# Patient Record
Sex: Female | Born: 1950 | Race: White | Hispanic: No | Marital: Married | Smoking: Former smoker
Health system: Southern US, Community
[De-identification: ages and names within clinical notes are randomized; demographics above are authoritative.]

## PROBLEM LIST (undated history)

## (undated) DIAGNOSIS — F419 Anxiety disorder, unspecified: Secondary | ICD-10-CM

## (undated) DIAGNOSIS — Z9289 Personal history of other medical treatment: Secondary | ICD-10-CM

## (undated) DIAGNOSIS — K5792 Diverticulitis of intestine, part unspecified, without perforation or abscess without bleeding: Secondary | ICD-10-CM

## (undated) DIAGNOSIS — R51 Headache: Secondary | ICD-10-CM

## (undated) DIAGNOSIS — G4700 Insomnia, unspecified: Secondary | ICD-10-CM

## (undated) DIAGNOSIS — F32A Depression, unspecified: Secondary | ICD-10-CM

## (undated) DIAGNOSIS — F329 Major depressive disorder, single episode, unspecified: Secondary | ICD-10-CM

## (undated) DIAGNOSIS — D649 Anemia, unspecified: Secondary | ICD-10-CM

## (undated) DIAGNOSIS — Z8709 Personal history of other diseases of the respiratory system: Secondary | ICD-10-CM

## (undated) DIAGNOSIS — Z87442 Personal history of urinary calculi: Secondary | ICD-10-CM

## (undated) DIAGNOSIS — C50919 Malignant neoplasm of unspecified site of unspecified female breast: Secondary | ICD-10-CM

## (undated) DIAGNOSIS — K219 Gastro-esophageal reflux disease without esophagitis: Secondary | ICD-10-CM

## (undated) DIAGNOSIS — I10 Essential (primary) hypertension: Secondary | ICD-10-CM

## (undated) DIAGNOSIS — Z8619 Personal history of other infectious and parasitic diseases: Secondary | ICD-10-CM

## (undated) DIAGNOSIS — J189 Pneumonia, unspecified organism: Secondary | ICD-10-CM

## (undated) DIAGNOSIS — H269 Unspecified cataract: Secondary | ICD-10-CM

## (undated) DIAGNOSIS — E785 Hyperlipidemia, unspecified: Secondary | ICD-10-CM

## (undated) HISTORY — PX: TUBAL LIGATION: SHX77

## (undated) HISTORY — PX: COLON SURGERY: SHX602

## (undated) HISTORY — PX: INCISIONAL HERNIA REPAIR: SHX193

## (undated) HISTORY — DX: Headache: R51

## (undated) HISTORY — PX: TONSILLECTOMY: SUR1361

## (undated) HISTORY — PX: MASTECTOMY: SHX3

## (undated) HISTORY — DX: Anxiety disorder, unspecified: F41.9

## (undated) HISTORY — PX: ESOPHAGOGASTRODUODENOSCOPY: SHX1529

## (undated) HISTORY — DX: Depression, unspecified: F32.A

## (undated) HISTORY — DX: Major depressive disorder, single episode, unspecified: F32.9

## (undated) HISTORY — PX: OTHER SURGICAL HISTORY: SHX169

---

## 1999-08-06 ENCOUNTER — Ambulatory Visit (HOSPITAL_COMMUNITY): Admission: RE | Admit: 1999-08-06 | Discharge: 1999-08-07 | Payer: Self-pay | Admitting: Cardiology

## 2012-06-23 HISTORY — PX: HERNIA REPAIR: SHX51

## 2012-07-30 DIAGNOSIS — N2889 Other specified disorders of kidney and ureter: Secondary | ICD-10-CM | POA: Insufficient documentation

## 2012-07-30 DIAGNOSIS — E669 Obesity, unspecified: Secondary | ICD-10-CM | POA: Insufficient documentation

## 2012-08-31 DIAGNOSIS — I10 Essential (primary) hypertension: Secondary | ICD-10-CM | POA: Insufficient documentation

## 2012-08-31 DIAGNOSIS — K219 Gastro-esophageal reflux disease without esophagitis: Secondary | ICD-10-CM | POA: Insufficient documentation

## 2012-08-31 DIAGNOSIS — F419 Anxiety disorder, unspecified: Secondary | ICD-10-CM | POA: Insufficient documentation

## 2012-09-02 DIAGNOSIS — D649 Anemia, unspecified: Secondary | ICD-10-CM | POA: Insufficient documentation

## 2013-02-24 ENCOUNTER — Encounter (HOSPITAL_COMMUNITY): Payer: Self-pay | Admitting: Psychiatry

## 2013-02-24 ENCOUNTER — Ambulatory Visit (INDEPENDENT_AMBULATORY_CARE_PROVIDER_SITE_OTHER): Payer: Self-pay | Admitting: Psychiatry

## 2013-02-24 VITALS — BP 110/80 | Ht 60.0 in | Wt 145.0 lb

## 2013-02-24 DIAGNOSIS — F332 Major depressive disorder, recurrent severe without psychotic features: Secondary | ICD-10-CM

## 2013-02-24 DIAGNOSIS — F411 Generalized anxiety disorder: Secondary | ICD-10-CM

## 2013-02-24 MED ORDER — OLANZAPINE 5 MG PO TABS: 5.0000 mg | ORAL_TABLET | Freq: Every day | ORAL | Status: DC

## 2013-02-24 MED ORDER — ALPRAZOLAM 0.5 MG PO TABS: 0.5000 mg | ORAL_TABLET | Freq: Four times a day (QID) | ORAL | Status: DC

## 2013-02-24 MED ORDER — DULOXETINE HCL 60 MG PO CPEP: 60.0000 mg | ORAL_CAPSULE | Freq: Two times a day (BID) | ORAL | Status: DC

## 2013-02-24 MED ORDER — LAMOTRIGINE 100 MG PO TABS: 100.0000 mg | ORAL_TABLET | Freq: Every day | ORAL | Status: DC

## 2013-02-24 NOTE — [facility] Notes (Signed)
Psychiatric Assessment Adult  Patient Identification:  Caitlin [name] Date of Evaluation:  02/24/2013 Chief Complaint: "I'm very depressed." History of Chief Complaint:  The patient has a history of depression and anxiety dating back to the late 90s Chief Complaint  Patient presents with  . Anxiety  . Depression    HPI this patient is a 62 year old married white female who lives with her husband in [place]. She has 4 children and 6 grandchildren. She worked in a [employer] until they let her go in 2005. She is self-referred.  The patient states that her depression started in the late 90s. She had to have an emergency colostomy due to diverticulosis. She had complications from the surgery and had to have a hernia repair and several other surgeries on her abdomen. She is left with a huge scar which lowered her self-confidence and self-esteem. She also suffers from migraine headaches.  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 Since then she's really gone downhill. She's become increasingly depressed and anxious. She's been seen at [facility] since 2009 but feels like the most recent psychiatrist does not listen to her and changed all the medications that were helpful. Currently she has lots of headaches, she's tired all the time and doesn't leave the house. She feels like she is a burden to everyone around her and has passive suicidal ideation but no plan. She sleeps okay her appetite has diminished.  In 2011 she was admitted to [facility] because she was depressed and psychotic. She thinks this was a reaction to a medication that she got from migraine headache. She doesn't recall the name of the medicine. She was not hospitalized since then has had little therapy. Review of Systems  Constitutional: Positive for fatigue.  Neurological: Positive for headaches.  Psychiatric/Behavioral: The patient is nervous/anxious.    chronic migraine headaches Physical Exam  not done  Depressive Symptoms: depressed mood, anhedonia, psychomotor retardation, fatigue, feelings of worthlessness/guilt, difficulty concentrating, hopelessness, impaired memory, suicidal thoughts without plan, anxiety, loss of energy/fatigue, weight loss,  (Hypo) Manic Symptoms:   Elevated Mood:  No Irritable Mood:  No Grandiosity:  No Distractibility:  No Labiality of Mood:  No Delusions:  No Hallucinations:  No Impulsivity:  No Sexually Inappropriate Behavior:  No Financial Extravagance:  No Flight of Ideas:  No  Anxiety Symptoms: Excessive Worry:  yes Panic Symptoms:  Yes Agoraphobia:  Yes Obsessive Compulsive: Yes  Symptoms: None, Specific Phobias:  No Social Anxiety:  Yes  Psychotic Symptoms:  Hallucinations: No None Delusions:  No Paranoia:  No   Ideas of Reference:  No  PTSD Symptoms: Ever had a traumatic exposure:  No Had a traumatic exposure in the last month:  No Re-experiencing: No None Hypervigilance:  No Hyperarousal: No None Avoidance: Yes Foreshortened Future  Traumatic Brain Injury: No   Past Psychiatric History: Diagnosis: Maj. 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 Anxiety   . Depression    History of Loss of Consciousness:  No Seizure History:  No Cardiac History:  No Allergies:see below   Allergies  Allergen Reactions  . Codeine  Nausea/vomiting   Current Medications:  Current Outpatient Prescriptions  Medication Sig Dispense Refill  . ALPRAZolam (XANAX) 0.5 MG tablet Take 1 tablet (0.5 mg total) by mouth 4 (four) times daily.  30 tablet  2  . atorvastatin (LIPITOR) 10 MG tablet  Take 10 mg by mouth daily.      . butalbital-acetaminophen-caffeine (FIORICET, ESGIC) 50-325-40 MG per tablet       . DULoxetine (CYMBALTA) 60 MG capsule Take 1 capsule (60 mg total) by mouth 2 (two) times daily.  60 capsule  2  . lamoTRIgine (LAMICTAL) 100 MG tablet Take 1 tablet (100 mg total) by mouth daily.  30 tablet  2  . losartan (COZAAR) 100 MG tablet Take 100 mg by mouth daily.      Marland Kitchen OLANZapine (ZYPREXA) 5 MG tablet Take 1 tablet (5 mg total) by mouth at bedtime.  30 tablet  2  . topiramate (TOPAMAX) 100 MG tablet        No current facility-administered medications for this visit.    Previous Psychotropic Medications:  Medication Dose  Clonazepam 0.5 mg bid  Cymbalta  60 mg qhs  Lamictal 100 mg qhs               Substance Abuse History in the last 12 months:none                                                                                                   Medical Consequences of Substance Abuse:n/a  Legal Consequences of Substance Abuse: n/a  Family Consequences of Substance Abuse: n/a  Blackouts:  No DT's:  No Withdrawal Symptoms:  No None  Social History: Current Place of Residence: [place], [place] Place of Birth:unknown Family Members: Husband 4 children 6 grandchildren Marital Status:  Married Children: see above  Sons: See above  Daughters: See above Relationships: See above Education:  HS Graduate Educational Problems/Performance: Religious Beliefs/Practices: Christian History of Abuse: none Armed forces technical officer; former Furniture conservator/restorer History:  None. Legal History:none Hobbies/Interests: Used to love ceramics  Family History:   Family History  Problem Relation Age of Onset  . Depression Mother   . Alcohol abuse Father     Mental Status Examination/Evaluation: Objective:  Appearance: Well Groomed  Patent attorney::  Fair  Speech:  Slow  Volume:  Decreased  Mood:  Depressed and anxious   Affect:  Constricted,  Depressed and Tearful  Thought Process:  Coherent  Orientation:  Full (Time, Place, and Person)  Thought Content:  Negative  Suicidal Thoughts:  Yes.  without intent/plan  Homicidal Thoughts:  No  Judgement:  Impaired  Insight:  Lacking  Psychomotor Activity:  Decreased  Akathisia:  No  Handed:  Right  AIMS (if indicated):  n/a  Assets:  Communication Skills Desire for Improvement Intimacy Social Support Talents/Skills    Laboratory/X-Ray Psychological Evaluation(s)  We'll have these sent from her primary Dr.   none   Assessment: AXIS I Maj. depression, recurrent severe, generalized anxiety disorder  AXIS II Deferred  AXIS III Past Medical History  Diagnosis Date  . Anxiety   . Depression  migraine headache, history of colon resection and hernia repair, history of right kidney tumor removal   AXIS IV minimal  AXIS V 41-50 serious symptoms   Treatment Plan/Recommendations:  Plan of Care: Medications and psychotherapy   Laboratory:  We will have these sent over from her primary physician   Psychotherapy: She will start counseling as soon as possible with one of our therapists  Medications: Her husband gave me a list of medicines that seem to been helpful in the past-she will increase Cymbalta to 60 mg twice a day, discontinue clonazepam, start Xanax 0.5 mg 4 times a day, continue Lamictal 100 mg each bedtime and start Zyprexa 5 mg each bedtime   Routine PRN Medications:  No  Consultations:none  Safety Concerns: She and her husband have been instructed to call if her depression or suicidal ideation worsens. She'll return to see me in four-week's   Other: none     Diannia Ruder, MD 9/4/20143:53 PM

## 2013-03-24 ENCOUNTER — Ambulatory Visit (INDEPENDENT_AMBULATORY_CARE_PROVIDER_SITE_OTHER): Payer: No Typology Code available for payment source | Admitting: Psychiatry

## 2013-03-24 ENCOUNTER — Encounter (HOSPITAL_COMMUNITY): Payer: Self-pay | Admitting: Psychiatry

## 2013-03-24 VITALS — BP 120/72 | Ht 60.0 in | Wt 148.0 lb

## 2013-03-24 DIAGNOSIS — F329 Major depressive disorder, single episode, unspecified: Secondary | ICD-10-CM | POA: Insufficient documentation

## 2013-03-24 DIAGNOSIS — F411 Generalized anxiety disorder: Secondary | ICD-10-CM

## 2013-03-24 DIAGNOSIS — F332 Major depressive disorder, recurrent severe without psychotic features: Secondary | ICD-10-CM

## 2013-03-24 MED ORDER — ALPRAZOLAM 0.5 MG PO TABS: 0.5000 mg | ORAL_TABLET | Freq: Four times a day (QID) | ORAL | Status: DC

## 2013-03-24 NOTE — [facility] Notes (Signed)
Patient ID: Caitlin [name], female   DOB: 1950-10-18, 62 y.o.   MRN: [id number]  Psychiatric Assessment Adult  Patient Identification:  Caitlin [name] Date of Evaluation:  03/24/2013 Chief Complaint: "I'm doing much better now.'  History of Chief Complaint:  The patient has a history of depression and anxiety dating back to the late 90s  Chief Complaint  Patient presents with  . Anxiety  . Depression  . Follow-up    Anxiety Patient reports no nervous/anxious behavior.     this patient is a 62 year old married white female who lives with her husband in [place]. She has 4 children and 6 grandchildren. She worked in a [employer] until they let her go in 2005. She is self-referred.  The patient states that her depression started in the late 90s. She had to have an emergency colostomy due to diverticulosis. She had complications from the surgery and had to have a hernia repair and several other surgeries on her abdomen. She is left with a huge scar which lowered her self-confidence and self-esteem. She also suffers from migraine headaches.  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 Since then she's really gone downhill. She's become increasingly depressed and anxious. She's been seen at [facility] since 2009 but feels like the most recent psychiatrist does not listen to her and changed all the medications that were helpful. Currently she has lots of headaches, she's tired all the time and doesn't leave the house. She feels like she is a burden to everyone around her and has passive suicidal ideation but no plan. She sleeps okay her appetite has diminished.  In 2011 she was admitted to [facility] because she was depressed and psychotic. She thinks this was a reaction to a medication that she got from migraine headache. She doesn't recall the name of the medicine. She was not hospitalized since then has had little therapy  The patient returns after 4 weeks I  readjusted her medicines so she is now on Zyprexa and Xanax and increase Cymbalta. She is feeling much better. She feels like she is "back to myself." She is sleeping well, her mood is good and her headaches has disappeared. She's no longer having any suicidal ideation. She's able to go out and shop in stores by herself. She and her daughter even had her nails done Review of Systems  Constitutional: Negative for fatigue.  Neurological: Negative for headaches.  Psychiatric/Behavioral: The patient is not nervous/anxious.    chronic migraine headaches Physical Exam not done  Depressive Symptoms: depressed mood, anhedonia, psychomotor retardation, fatigue, feelings of worthlessness/guilt, difficulty concentrating, hopelessness, impaired memory, suicidal thoughts without plan, anxiety, loss of energy/fatigue, weight loss,  (Hypo) Manic Symptoms:   Elevated Mood:  No Irritable Mood:  No Grandiosity:  No Distractibility:  No Labiality of Mood:  No Delusions:  No Hallucinations:  No Impulsivity:  No Sexually Inappropriate Behavior:  No Financial Extravagance:  No Flight of Ideas:  No  Anxiety Symptoms: Excessive Worry:  yes Panic Symptoms:  Yes Agoraphobia:  Yes Obsessive Compulsive: Yes  Symptoms: None, Specific Phobias:  No Social Anxiety:  Yes  Psychotic Symptoms:  Hallucinations: No None Delusions:  No Paranoia:  No   Ideas of Reference:  No  PTSD Symptoms: Ever had a traumatic exposure:  No Had a traumatic exposure in the last month:  No Re-experiencing: No None Hypervigilance:  No Hyperarousal: No None Avoidance: Yes Foreshortened Future  Traumatic Brain Injury: No  Past Psychiatric History: Diagnosis: Maj. 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 Anxiety   . Depression    History of Loss of Consciousness:  No Seizure History:  No Cardiac History:  No Allergies:see below   Allergies  Allergen Reactions  . Codeine     Nausea/vomiting   Current Medications:  Current Outpatient Prescriptions  Medication Sig Dispense Refill  . ALPRAZolam (XANAX) 0.5 MG tablet Take 1 tablet (0.5 mg total) by mouth 4 (four) times daily.  30 tablet  2  . atorvastatin (LIPITOR) 10 MG tablet Take 10 mg by mouth daily.      . butalbital-acetaminophen-caffeine (FIORICET, ESGIC) 50-325-40 MG per tablet       . DULoxetine (CYMBALTA) 60 MG capsule Take 1 capsule (60 mg total) by mouth 2 (two) times daily.  60 capsule  2  . lamoTRIgine (LAMICTAL) 100 MG tablet Take 1 tablet (100 mg total) by mouth daily.  30 tablet  2  . losartan (COZAAR) 100 MG tablet Take 100 mg by mouth daily.      Marland Kitchen OLANZapine (ZYPREXA) 5 MG tablet Take 1 tablet (5 mg total) by mouth at bedtime.  30 tablet  2  . topiramate (TOPAMAX) 100 MG tablet        No current facility-administered medications for this visit.    Previous Psychotropic Medications:  Medication Dose  Clonazepam 0.5 mg bid  Cymbalta  60 mg qhs  Lamictal 100 mg qhs               Substance Abuse History in the last 12 months:none                                                                                                   Medical Consequences of Substance Abuse:n/a  Legal Consequences of Substance Abuse: n/a  Family Consequences of Substance Abuse: n/a  Blackouts:  No DT's:  No Withdrawal Symptoms:  No None  Social History: Current Place of Residence: [place], [place] Place of Birth:unknown Family Members: Husband 4 children 6 grandchildren Marital Status:  Married Children: see above  Sons: See above  Daughters: See above Relationships: See  above Education:  HS Graduate Educational Problems/Performance: Religious Beliefs/Practices: Christian History of Abuse: none Armed forces technical officer; former Furniture conservator/restorer History:  None. Legal History:none Hobbies/Interests: Used to love ceramics  Family History:   Family History  Problem Relation Age of Onset  . Depression Mother   . Alcohol abuse Father     Mental Status Examination/Evaluation: Objective:  Appearance: Well Groomed  Patent attorney::  Fair  Speech:  Normal   Volume:  Normal   Mood:  Happy   Affect:  Bright and cheerful   Thought Process:  Coherent  Orientation:  Full (Time, Place,  and Person)  Thought Content:  Negative  Suicidal Thoughts:  Yes.  without intent/plan  Homicidal Thoughts:  No  Judgement:  Good   Insight:  Good   Psychomotor Activity:  Decreased  Akathisia:  No  Handed:  Right  AIMS (if indicated):  n/a  Assets:  Communication Skills Desire for Improvement Intimacy Social Support Talents/Skills    Laboratory/X-Ray Psychological Evaluation(s)  We'll have these sent from her primary Dr.   none   Assessment: AXIS I Maj. depression, recurrent severe, generalized anxiety disorder  AXIS II Deferred  AXIS III Past Medical History  Diagnosis Date  . Anxiety   . Depression    migraine headache, history of colon resection and hernia repair, history of right kidney tumor removal   AXIS IV minimal  AXIS V 41-50 serious symptoms   Treatment Plan/Recommendations:  Plan of Care: Medications and psychotherapy   Laboratory:  We will have these sent over from her primary physician   Psychotherapy: She will start counseling as soon as possible with one of our therapists  Medications: Her husband gave me a list of medicines that seem to been helpful in the past-she will increase Cymbalta to 60 mg twice a day, discontinue clonazepam, start Xanax 0.5 mg 4 times a day, continue Lamictal 100 mg each bedtime and start Zyprexa 5 mg each  bedtime the patient will continue these medications as they've been very helpful.   Routine PRN Medications:  No  Consultations:none  Safety Concerns: She and her husband have been instructed to call if her depression or suicidal ideation worsens. She'll return to see me in 2 months   Other: none     Xadrian Craighead, MD 10/2/20141:17 PM

## 2013-05-24 ENCOUNTER — Ambulatory Visit (INDEPENDENT_AMBULATORY_CARE_PROVIDER_SITE_OTHER): Payer: No Typology Code available for payment source | Admitting: Psychiatry

## 2013-05-24 ENCOUNTER — Encounter (HOSPITAL_COMMUNITY): Payer: Self-pay | Admitting: Psychiatry

## 2013-05-24 VITALS — BP 130/78 | Ht 60.0 in | Wt 155.0 lb

## 2013-05-24 DIAGNOSIS — F329 Major depressive disorder, single episode, unspecified: Secondary | ICD-10-CM

## 2013-05-24 DIAGNOSIS — F411 Generalized anxiety disorder: Secondary | ICD-10-CM

## 2013-05-24 DIAGNOSIS — F332 Major depressive disorder, recurrent severe without psychotic features: Secondary | ICD-10-CM

## 2013-05-24 MED ORDER — ALPRAZOLAM 0.5 MG PO TABS: 0.5000 mg | ORAL_TABLET | Freq: Four times a day (QID) | ORAL | Status: DC

## 2013-05-24 MED ORDER — OLANZAPINE 5 MG PO TABS: 5.0000 mg | ORAL_TABLET | Freq: Every day | ORAL | Status: DC

## 2013-05-24 MED ORDER — DULOXETINE HCL 60 MG PO CPEP: 60.0000 mg | ORAL_CAPSULE | Freq: Two times a day (BID) | ORAL | Status: DC

## 2013-05-24 MED ORDER — LAMOTRIGINE 100 MG PO TABS: 100.0000 mg | ORAL_TABLET | Freq: Every day | ORAL | Status: DC

## 2013-05-24 NOTE — [facility] Notes (Signed)
Patient ID: Caitlin [name], female   DOB: [birth year], 62 y.o.   MRN: [id number] Patient ID: Caitlin [name], female   DOB: August 13, 1950, 62 y.o.   MRN: [id number]  Psychiatric Assessment Adult  Patient Identification:  Caitlin [name] Date of Evaluation:  05/24/2013 Chief Complaint: "I'm doing much better now.'  History of Chief Complaint:  The patient has a history of depression and anxiety dating back to the late 90s  Chief Complaint  Patient presents with  . Anxiety  . Depression  . Follow-up    Anxiety Patient reports no nervous/anxious behavior.     this patient is a 62 year old married white female who lives with her husband in [place]. She has 4 children and 6 grandchildren. She worked in a [employer] until they let her go in 2005. She is self-referred.  The patient states that her depression started in the late 90s. She had to have an emergency colostomy due to diverticulosis. She had complications from the surgery and had to have a hernia repair and several other surgeries on her abdomen. She is left with a huge scar which lowered her self-confidence and self-esteem. She also suffers from migraine headaches.  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 Since then she's really gone downhill. She's become increasingly depressed and anxious. She's been seen at [facility] since 2009 but feels like the most recent psychiatrist does not listen to her and changed all the medications that were helpful. Currently she has lots of headaches, she's tired all the time and doesn't leave the house. She feels like she is a burden to everyone around her and has passive suicidal ideation but no plan. She sleeps okay her appetite has diminished.  In 2011 she was admitted to [facility] because she was depressed and psychotic. She thinks this was a reaction to a medication that she got from migraine headache. She doesn't recall the name of the medicine. She was not hospitalized since  then has had little therapy  Patient returns after 2 months. She was doing very well last time on the new medications. On November 18 she had a hernia repair. She was off her medications for several days and regressed. Her husband tells me she got very confused and it sounds like she has some postsurgical delirium. She's back at home now and is recovering slowly. She has lost her voice because she tried to pull out her ventilator tube. She is still in a lot of pain from the surgery and only had the drains removed yesterday. She's back on her medicines and is "slowly coming around. According to her husband. Review of Systems  Constitutional: Negative for fatigue.  Neurological: Negative for headaches.  Psychiatric/Behavioral: The patient is not nervous/anxious.    chronic migraine headaches Physical Exam not done  Depressive Symptoms: depressed mood, anhedonia, psychomotor retardation, fatigue, feelings of worthlessness/guilt, difficulty concentrating, hopelessness, impaired memory, suicidal thoughts without plan, anxiety, loss of energy/fatigue, weight loss,  (Hypo) Manic Symptoms:   Elevated Mood:  No Irritable Mood:  No Grandiosity:  No Distractibility:  No Labiality of Mood:  No Delusions:  No Hallucinations:  No Impulsivity:  No Sexually Inappropriate Behavior:  No Financial Extravagance:  No Flight of Ideas:  No  Anxiety Symptoms: Excessive Worry:  yes Panic Symptoms:  Yes Agoraphobia:  Yes Obsessive Compulsive: Yes  Symptoms: None, Specific Phobias:  No Social Anxiety:  Yes  Psychotic Symptoms:  Hallucinations: No None Delusions:  No Paranoia:  No  Ideas of Reference:  No  PTSD Symptoms: Ever had a traumatic exposure:  No Had a traumatic exposure in the last month:  No Re-experiencing: No None Hypervigilance:  No Hyperarousal: No None Avoidance: Yes Foreshortened Future  Traumatic Brain Injury: No   Past Psychiatric History: Diagnosis: Maj.  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 Anxiety   . Depression    History of Loss of Consciousness:  No Seizure History:  No Cardiac History:  No Allergies:see below   Allergies  Allergen Reactions  . Codeine     Nausea/vomiting   Current Medications:  Current Outpatient Prescriptions  Medication Sig Dispense Refill  . ALPRAZolam (XANAX) 0.5 MG tablet Take 1 tablet (0.5 mg total) by mouth 4 (four) times daily.  30 tablet  2  . atorvastatin (LIPITOR) 10 MG tablet Take 10 mg by mouth daily.      . butalbital-acetaminophen-caffeine (FIORICET, ESGIC) 50-325-40 MG per tablet       . DULoxetine (CYMBALTA) 60 MG capsule Take 1 capsule (60 mg total) by mouth 2 (two) times daily.  60 capsule  2  . HYDROcodone-acetaminophen (NORCO/VICODIN) 5-325 MG per tablet       . lamoTRIgine (LAMICTAL) 100 MG tablet Take 1 tablet (100 mg total) by mouth daily.  30 tablet  2  . losartan (COZAAR) 100 MG tablet Take 100 mg by mouth daily.      Marland Kitchen OLANZapine (ZYPREXA) 5 MG tablet Take 1 tablet (5 mg total) by mouth at bedtime.  30 tablet  2  . topiramate (TOPAMAX) 100 MG tablet        No current facility-administered medications for this visit.    Previous Psychotropic Medications:  Medication Dose  Clonazepam 0.5 mg bid  Cymbalta  60 mg qhs  Lamictal 100 mg qhs               Substance Abuse History in the last 12 months:none                                                                                                   Medical Consequences of Substance Abuse:n/a  Legal Consequences of Substance Abuse: n/a  Family Consequences of Substance  Abuse: n/a  Blackouts:  No DT's:  No Withdrawal Symptoms:  No None  Social History: Current Place of Residence: [place], [place] Place of Birth:unknown Family Members: Husband 4 children 6 grandchildren Marital Status:  Married Children: see above  Sons: See above  Daughters: See above Relationships: See above Education:  HS Graduate Educational Problems/Performance: Religious Beliefs/Practices: Christian History of Abuse: none Armed forces technical officer; former Furniture conservator/restorer History:  None. Legal History:none Hobbies/Interests: Used to love ceramics  Family History:   Family History  Problem Relation Age of Onset  .  Depression Mother   . Alcohol abuse Father     Mental Status Examination/Evaluation: Objective:  Appearance: Well Groomed  Patent attorney::  Fair  Speech: Faint and difficult to understand   Volume:  Normal   Mood:  She is obviously in pain today   Affect:  Congruent   Thought Process:  Coherent  Orientation:  Full (Time, Place, and Person)  Thought Content:  Negative  Suicidal Thoughts:  Yes.  without intent/plan  Homicidal Thoughts:  No  Judgement:  Good   Insight:  Good   Psychomotor Activity:  Decreased  Akathisia:  No  Handed:  Right  AIMS (if indicated):  n/a  Assets:  Communication Skills Desire for Improvement Intimacy Social Support Talents/Skills    Laboratory/X-Ray Psychological Evaluation(s)  We'll have these sent from her primary Dr.   none   Assessment: AXIS I Maj. depression, recurrent severe, generalized anxiety disorder  AXIS II Deferred  AXIS III Past Medical History  Diagnosis Date  . Anxiety   . Depression    migraine headache, history of colon resection and hernia repair, history of right kidney tumor removal   AXIS IV minimal  AXIS V 41-50 serious symptoms   Treatment Plan/Recommendations:  Plan of Care: Medications and psychotherapy   Laboratory:  We will have these sent over from her primary physician    Psychotherapy: She will start counseling as soon as possible with one of our therapists  Medications: Her husband gave me a list of medicines that seem to been helpful in the past-she will increase Cymbalta to 60 mg twice a day, discontinue clonazepam, start Xanax 0.5 mg 4 times a day, continue Lamictal 100 mg each bedtime and start Zyprexa 5 mg each bedtime the patient will continue these medications as they've been very helpful.   Routine PRN Medications:  No  Consultations:none  Safety Concerns: She and her husband have been instructed to call if her depression or suicidal ideation worsens. She'll return to see me in 2 months   Other: none     Diannia Ruder, MD 12/2/20141:14 PM

## 2013-07-26 ENCOUNTER — Encounter (HOSPITAL_COMMUNITY): Payer: Self-pay | Admitting: Psychiatry

## 2013-07-26 ENCOUNTER — Ambulatory Visit (INDEPENDENT_AMBULATORY_CARE_PROVIDER_SITE_OTHER): Payer: Medicare HMO | Admitting: Psychiatry

## 2013-07-26 VITALS — BP 110/72 | Ht 60.0 in | Wt 150.0 lb

## 2013-07-26 DIAGNOSIS — F411 Generalized anxiety disorder: Secondary | ICD-10-CM

## 2013-07-26 DIAGNOSIS — F332 Major depressive disorder, recurrent severe without psychotic features: Secondary | ICD-10-CM

## 2013-07-26 DIAGNOSIS — F329 Major depressive disorder, single episode, unspecified: Secondary | ICD-10-CM

## 2013-07-26 MED ORDER — DULOXETINE HCL 60 MG PO CPEP: 60.0000 mg | ORAL_CAPSULE | Freq: Two times a day (BID) | ORAL | Status: DC

## 2013-07-26 MED ORDER — OLANZAPINE 5 MG PO TABS: 5.0000 mg | ORAL_TABLET | Freq: Every day | ORAL | Status: DC

## 2013-07-26 MED ORDER — LAMOTRIGINE 100 MG PO TABS: 100.0000 mg | ORAL_TABLET | Freq: Every day | ORAL | Status: DC

## 2013-07-26 MED ORDER — ALPRAZOLAM 0.5 MG PO TABS: 0.5000 mg | ORAL_TABLET | Freq: Four times a day (QID) | ORAL | Status: DC

## 2013-07-26 NOTE — Progress Notes (Signed)
Patient ID: Caitlin [name], female   DOB: 1950/08/22, 63 y.o.   MRN: [id number] Patient ID: Caitlin [name], female   DOB: September 06, 1950, 63 y.o.   MRN: [id number] Patient ID: Caitlin [name], female   DOB: 12/30/1950, 63 y.o.   MRN: [id number]  Psychiatric Assessment Adult  Patient Identification:  Caitlin [name] Date of Evaluation:  07/26/2013 Chief Complaint: "I have a headache today.'  History of Chief Complaint:  The patient has a history of depression and anxiety dating back to the late 90s  Chief Complaint  Patient presents with  . Anxiety  . Depression  . Follow-up    Anxiety Patient reports no nervous/anxious behavior.     this patient is a 63 year old married white female who lives with her husband in [place]. She has 4 children and 6 grandchildren. She worked in a [employer] until they let her go in 2005. She is self-referred.  The patient states that her depression started in the late 90s. She had to have an emergency colostomy due to diverticulosis. She had complications from the surgery and had to have a hernia repair and several other surgeries on her abdomen. She is left with a huge scar which lowered her self-confidence and self-esteem. She also suffers from migraine headaches.  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 Since then she's really gone downhill. She's become increasingly depressed and anxious. She's been seen at [facility] since 2009 but feels like the most recent psychiatrist does not listen to her and changed all the medications that were helpful. Currently she has lots of headaches, she's tired all the time and doesn't leave the house. She feels like she is a burden to everyone around her and has passive suicidal ideation but no plan. She sleeps okay her appetite has diminished.  In 2011 she was admitted to [facility] because she was depressed and psychotic. She thinks this was a reaction to a medication that she got from migraine headache. She  doesn't recall the name of the medicine. She was not hospitalized since then has had little therapy  Patient returns after 2 months. She is healed up from her hernia repair. Today she has a bad headache and states she's been going on for a while. They tend to run in her family in numerous people have migraines. Her primary Dr. put her on Fioricet which helps to some degree but makes her sleepy the afternoon. Her husband still think she worries too much about things and having happened yet. She is only using 2 Xanax a day and I think we need to increase this. Her mood is been fairly stable although she misses the children. They do come over every Sunday for dinner. Review of Systems  Constitutional: Negative for fatigue.  Neurological: Negative for headaches.  Psychiatric/Behavioral: The patient is not nervous/anxious.    chronic migraine headaches Physical Exam not done  Depressive Symptoms: depressed mood, anhedonia, psychomotor retardation, fatigue, feelings of worthlessness/guilt, difficulty concentrating, hopelessness, impaired memory, suicidal thoughts without plan, anxiety, loss of energy/fatigue, weight loss,  (Hypo) Manic Symptoms:   Elevated Mood:  No Irritable Mood:  No Grandiosity:  No Distractibility:  No Labiality of Mood:  No Delusions:  No Hallucinations:  No Impulsivity:  No Sexually Inappropriate Behavior:  No Financial Extravagance:  No Flight of Ideas:  No  Anxiety Symptoms: Excessive Worry:  yes Panic Symptoms:  Yes Agoraphobia:  Yes Obsessive Compulsive: Yes  Symptoms: None, Specific Phobias:  No Social  Anxiety:  Yes  Psychotic Symptoms:  Hallucinations: No None Delusions:  No Paranoia:  No   Ideas of Reference:  No  PTSD Symptoms: Ever had a traumatic exposure:  No Had a traumatic exposure in the last month:  No Re-experiencing: No None Hypervigilance:  No Hyperarousal: No None Avoidance: Yes Foreshortened Future  Traumatic Brain  Injury: No   Past Psychiatric History: Diagnosis: Maj. 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 Anxiety   . Depression   . Headache(784.0)    History of Loss of Consciousness:  No Seizure History:  No Cardiac History:  No Allergies:see below   Allergies  Allergen Reactions  . Codeine     Nausea/vomiting   Current Medications:  Current Outpatient Prescriptions  Medication Sig Dispense Refill  . ALPRAZolam (XANAX) 0.5 MG tablet Take 1 tablet (0.5 mg total) by mouth 4 (four) times daily.  30 tablet  2  . atorvastatin (LIPITOR) 10 MG tablet Take 10 mg by mouth daily.      . butalbital-acetaminophen-caffeine (FIORICET, ESGIC) 50-325-40 MG per tablet       . DULoxetine (CYMBALTA) 60 MG capsule Take 1 capsule (60 mg total) by mouth 2 (two) times daily.  60 capsule  2  . HYDROcodone-acetaminophen (NORCO/VICODIN) 5-325 MG per tablet       . lamoTRIgine (LAMICTAL) 100 MG tablet Take 1 tablet (100 mg total) by mouth daily.  30 tablet  2  . losartan (COZAAR) 100 MG tablet Take 100 mg by mouth daily.      Marland Kitchen OLANZapine (ZYPREXA) 5 MG tablet Take 1 tablet (5 mg total) by mouth at bedtime.  30 tablet  2  . topiramate (TOPAMAX) 100 MG tablet        No current facility-administered medications for this visit.    Previous Psychotropic Medications:  Medication Dose  Clonazepam 0.5 mg bid  Cymbalta  60 mg qhs  Lamictal 100 mg qhs               Substance Abuse History in the last 12 months:none                                                                                                   Medical Consequences of Substance  Abuse:n/a  Legal Consequences of Substance Abuse: n/a  Family Consequences of Substance Abuse: n/a  Blackouts:  No DT's:  No Withdrawal Symptoms:  No None  Social History: Current Place of Residence: [place], [place] Family Members: Husband 4 children 6 grandchildren Marital Status:  Married Children: see above  Sons: See above  Daughters: See above Relationships: See above Education:  HS Graduate Educational Problems/Performance: Religious Beliefs/Practices: Christian History of Abuse: none Pensions consultant; former Solicitor History:  None.  Legal History:none Hobbies/Interests: Used to love ceramics  Family History:   Family History  Problem Relation Age of Onset  . Depression Mother   . Alcohol abuse Father     Mental Status Examination/Evaluation: Objective:  Appearance: Well Groomed tired and keeps closing her eyes   Eye Contact::  Fair  Speech: Faint and difficult to understand   Volume:  Normal   Mood:  She is obviously in pain today   Affect:  Congruent   Thought Process:  Coherent  Orientation:  Full (Time, Place, and Person)  Thought Content:  Negative  Suicidal Thoughts:  Yes.  without intent/plan  Homicidal Thoughts:  No  Judgement:  Good   Insight:  Good   Psychomotor Activity:  Decreased  Akathisia:  No  Handed:  Right  AIMS (if indicated):  n/a  Assets:  Communication Skills Desire for Improvement Intimacy Social Support Talents/Skills    Laboratory/X-Ray Psychological Evaluation(s)  We'll have these sent from her primary Dr.   none   Assessment: AXIS I Maj. depression, recurrent severe, generalized anxiety disorder  AXIS II Deferred  AXIS III Past Medical History  Diagnosis Date  . Anxiety   . Depression   . Headache(784.0)    migraine headache, history of colon resection and hernia repair, history of right kidney tumor removal   AXIS IV minimal  AXIS V 41-50 serious symptoms   Treatment  Plan/Recommendations:  Plan of Care: Medications and psychotherapy   Laboratory:  We will have these sent over from her primary physician   Psychotherapy: She tells me she really doesn't feel comfortable doing the counseling   Medications: She'll continue her current medications but make sure she continues the Xanax 0.5 mg 3 times a day   Routine PRN Medications:  No  Consultations:none  Safety Concerns: She and her husband have been instructed to call if her depression or suicidal ideation worsens. She'll return to see me in 6 weeks   Other: none     [name], MD 2/3/20151:32 PM

## 2013-09-06 ENCOUNTER — Encounter (HOSPITAL_COMMUNITY): Payer: Self-pay | Admitting: Psychiatry

## 2013-09-06 ENCOUNTER — Ambulatory Visit (INDEPENDENT_AMBULATORY_CARE_PROVIDER_SITE_OTHER): Payer: Medicare HMO | Admitting: Psychiatry

## 2013-09-06 VITALS — BP 100/70 | Ht 60.0 in | Wt 146.0 lb

## 2013-09-06 DIAGNOSIS — F332 Major depressive disorder, recurrent severe without psychotic features: Secondary | ICD-10-CM

## 2013-09-06 DIAGNOSIS — F329 Major depressive disorder, single episode, unspecified: Secondary | ICD-10-CM

## 2013-09-06 DIAGNOSIS — F411 Generalized anxiety disorder: Secondary | ICD-10-CM

## 2013-09-06 MED ORDER — ALPRAZOLAM 0.5 MG PO TABS: 0.5000 mg | ORAL_TABLET | Freq: Three times a day (TID) | ORAL | Status: DC | PRN

## 2013-09-06 MED ORDER — LAMOTRIGINE 100 MG PO TABS: 100.0000 mg | ORAL_TABLET | Freq: Every day | ORAL | Status: DC

## 2013-09-06 MED ORDER — DULOXETINE HCL 60 MG PO CPEP: 60.0000 mg | ORAL_CAPSULE | Freq: Two times a day (BID) | ORAL | Status: DC

## 2013-09-06 MED ORDER — OLANZAPINE 5 MG PO TABS: 5.0000 mg | ORAL_TABLET | Freq: Every day | ORAL | Status: DC

## 2013-09-06 NOTE — Progress Notes (Signed)
Patient ID: Caitlin [name], female   DOB: [birth year], 63 y.o.   MRN: [id number] Patient ID: Caitlin [name], female   DOB: 06/01/1951, 63 y.o.   MRN: [id number] Patient ID: Caitlin [name], female   DOB: May 04, 1951, 63 y.o.   MRN: [id number] Patient ID: [name], female   DOB: April 19, 1951, 63 y.o.   MRN: [id number]  Psychiatric Assessment Adult  Patient Identification:  Caitlin [name] Date of Evaluation:  09/06/2013 Chief Complaint: "I have a headache today.'  History of Chief Complaint:  The patient has a history of depression and anxiety dating back to the late 90s  Chief Complaint  Patient presents with  . Anxiety  . Depression  . Follow-up    Anxiety Patient reports no nervous/anxious behavior.     this patient is a 63 year old married white female who lives with her husband in [place]. She has 4 children and 6 grandchildren. She worked in a [employer] until they let her go in 2005. She is self-referred.  The patient states that her depression started in the late 90s. She had to have an emergency colostomy due to diverticulosis. She had complications from the surgery and had to have a hernia repair and several other surgeries on her abdomen. She is left with a huge scar which lowered her self-confidence and self-esteem. She also suffers from migraine headaches.  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 Since then she's really gone downhill. She's become increasingly depressed and anxious. She's been seen at [facility] since 2009 but feels like the most recent psychiatrist does not listen to her and changed all the medications that were helpful. Currently she has lots of headaches, she's tired all the time and doesn't leave the house. She feels like she is a burden to everyone around her and has passive suicidal ideation but no plan. She sleeps okay her appetite has diminished.  In 2011 she was admitted to [facility] because she was depressed and psychotic. She thinks  this was a reaction to a medication that she got from migraine headache. She doesn't recall the name of the medicine. She was not hospitalized since then has had little therapy  Patient returns after 2 months. She is doing better. She's just got over the flu and her energy is starting to come back. Her mood is generally good. She no longer feels suicidal or significantly depressed. She is sleeping well at night. She's hoping to get out more now the weather has improved and she is no longer sick Review of Systems  Constitutional: Negative for fatigue.  Neurological: Negative for headaches.  Psychiatric/Behavioral: The patient is not nervous/anxious.    chronic migraine headaches Physical Exam not done  Depressive Symptoms: depressed mood, anhedonia, psychomotor retardation, fatigue, feelings of worthlessness/guilt, difficulty concentrating, hopelessness, impaired memory, suicidal thoughts without plan, anxiety, loss of energy/fatigue, weight loss,  (Hypo) Manic Symptoms:   Elevated Mood:  No Irritable Mood:  No Grandiosity:  No Distractibility:  No Labiality of Mood:  No Delusions:  No Hallucinations:  No Impulsivity:  No Sexually Inappropriate Behavior:  No Financial Extravagance:  No Flight of Ideas:  No  Anxiety Symptoms: Excessive Worry:  yes Panic Symptoms:  Yes Agoraphobia:  Yes Obsessive Compulsive: Yes  Symptoms: None, Specific Phobias:  No Social Anxiety:  Yes  Psychotic Symptoms:  Hallucinations: No None Delusions:  No Paranoia:  No   Ideas of Reference:  No  PTSD Symptoms: Ever had a traumatic exposure:  No  Had a traumatic exposure in the last month:  No Re-experiencing: No None Hypervigilance:  No Hyperarousal: No None Avoidance: Yes Foreshortened Future  Traumatic Brain Injury: No   Past Psychiatric History: Diagnosis: Maj. 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 Anxiety   . Depression   . Headache(784.0)    History of Loss of Consciousness:  No Seizure History:  No Cardiac History:  No Allergies:see below   Allergies  Allergen Reactions  . Codeine     Nausea/vomiting   Current Medications:  Current Outpatient Prescriptions  Medication Sig Dispense Refill  . ALPRAZolam (XANAX) 0.5 MG tablet Take 1 tablet (0.5 mg total) by mouth 3 (three) times daily as needed for anxiety.  90 tablet  2  . atorvastatin (LIPITOR) 10 MG tablet Take 10 mg by mouth daily.      . butalbital-acetaminophen-caffeine (FIORICET, ESGIC) 50-325-40 MG per tablet       . DULoxetine (CYMBALTA) 60 MG capsule Take 1 capsule (60 mg total) by mouth 2 (two) times daily.  60 capsule  2  . HYDROcodone-acetaminophen (NORCO/VICODIN) 5-325 MG per tablet       . lamoTRIgine (LAMICTAL) 100 MG tablet Take 1 tablet (100 mg total) by mouth daily.  30 tablet  2  . losartan (COZAAR) 100 MG tablet Take 100 mg by mouth daily.      Marland Kitchen OLANZapine (ZYPREXA) 5 MG tablet Take 1 tablet (5 mg total) by mouth at bedtime.  30 tablet  2  . topiramate (TOPAMAX) 100 MG tablet        No current facility-administered medications for this visit.    Previous Psychotropic Medications:  Medication Dose  Clonazepam 0.5 mg bid  Cymbalta  60 mg qhs  Lamictal 100 mg qhs               Substance Abuse History in the last 12 months:none                                                                                                   Medical Consequences of Substance Abuse:n/a  Legal Consequences of Substance Abuse: n/a  Family Consequences of Substance Abuse: n/a  Blackouts:  No DT's:  No Withdrawal Symptoms:  No  None  Social History: Current Place of Residence: [place], [place] Family Members: Husband 4 children 6 grandchildren Marital Status:  Married Children: see above  Sons: See above  Daughters: See above Relationships: See above Education:  HS Graduate Educational Problems/Performance: Religious Beliefs/Practices: Christian History of Abuse: none Pensions consultant; former Solicitor History:  None. Legal History:none Hobbies/Interests: Used to love ceramics  Family History:   Family History  Problem Relation Age of Onset  . Depression Mother   . Alcohol abuse  Father     Mental Status Examination/Evaluation: Objective:  Appearance: Well Groomed    Patent attorney::  Fair  Speech: Clear   Volume:  Normal   Mood:  Fairly good today   Affect:  Congruent   Thought Process:  Coherent  Orientation:  Full (Time, Place, and Person)  Thought Content:  Negative  Suicidal Thoughts:  no  Homicidal Thoughts:  No  Judgement:  Good   Insight:  Good   Psychomotor Activity:  Decreased  Akathisia:  No  Handed:  Right  AIMS (if indicated):  n/a  Assets:  Communication Skills Desire for Improvement Intimacy Social Support Talents/Skills    Laboratory/X-Ray Psychological Evaluation(s)  We'll have these sent from her primary Dr.   none   Assessment: AXIS I Maj. depression, recurrent severe, generalized anxiety disorder  AXIS II Deferred  AXIS III Past Medical History  Diagnosis Date  . Anxiety   . Depression   . Headache(784.0)    migraine headache, history of colon resection and hernia repair, history of right kidney tumor removal   AXIS IV minimal  AXIS V 41-50 serious symptoms   Treatment Plan/Recommendations:  Plan of Care: Medications and psychotherapy   Laboratory:  We will have these sent over from her primary physician   Psychotherapy: She tells me she really doesn't feel comfortable doing the counseling   Medications: She'll  continue her current medications    Routine PRN Medications:  No  Consultations:none  Safety Concerns: She and her husband have been instructed to call if her depression or suicidal ideation worsens. She'll return to see me in 3 months   Other: none     Diannia Ruder, MD 3/17/20151:39 PM

## 2013-10-17 ENCOUNTER — Other Ambulatory Visit (HOSPITAL_COMMUNITY): Payer: Self-pay | Admitting: Internal Medicine

## 2013-10-17 DIAGNOSIS — Z1231 Encounter for screening mammogram for malignant neoplasm of breast: Secondary | ICD-10-CM

## 2013-10-24 ENCOUNTER — Ambulatory Visit (HOSPITAL_COMMUNITY)
Admission: RE | Admit: 2013-10-24 | Discharge: 2013-10-24 | Disposition: A | Discharge status: Home / Self Care | Payer: Medicare HMO | Source: Ambulatory Visit | Attending: Internal Medicine | Admitting: Internal Medicine

## 2013-10-24 DIAGNOSIS — Z1231 Encounter for screening mammogram for malignant neoplasm of breast: Secondary | ICD-10-CM | POA: Insufficient documentation

## 2013-10-25 ENCOUNTER — Other Ambulatory Visit: Payer: Self-pay | Admitting: Internal Medicine

## 2013-10-25 DIAGNOSIS — R928 Other abnormal and inconclusive findings on diagnostic imaging of breast: Secondary | ICD-10-CM

## 2013-11-04 ENCOUNTER — Other Ambulatory Visit: Payer: Self-pay | Admitting: Internal Medicine

## 2013-11-04 ENCOUNTER — Ambulatory Visit
Admission: RE | Admit: 2013-11-04 | Discharge: 2013-11-04 | Disposition: A | Discharge status: Home / Self Care | Payer: Medicare HMO | Source: Ambulatory Visit | Attending: Internal Medicine | Admitting: Internal Medicine

## 2013-11-04 DIAGNOSIS — R928 Other abnormal and inconclusive findings on diagnostic imaging of breast: Secondary | ICD-10-CM

## 2013-11-16 ENCOUNTER — Ambulatory Visit (INDEPENDENT_AMBULATORY_CARE_PROVIDER_SITE_OTHER): Payer: Medicare HMO | Admitting: General Surgery

## 2013-11-16 ENCOUNTER — Encounter (INDEPENDENT_AMBULATORY_CARE_PROVIDER_SITE_OTHER): Payer: Self-pay | Admitting: General Surgery

## 2013-11-16 VITALS — BP 128/80 | HR 87 | Temp 97.1°F | Ht 60.0 in | Wt 154.0 lb

## 2013-11-16 DIAGNOSIS — D0510 Intraductal carcinoma in situ of unspecified breast: Secondary | ICD-10-CM | POA: Insufficient documentation

## 2013-11-16 DIAGNOSIS — D0590 Unspecified type of carcinoma in situ of unspecified breast: Secondary | ICD-10-CM

## 2013-11-16 NOTE — Progress Notes (Signed)
Chief Complaint: New diagnosis of breast cancer  History:    Caitlin [name] is a 63 y.o. postmenopausal female referred by Dr. 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 She recently presented for a screening mamogram revealing extensive irregular masslike density associated with calcifications in the left breast..  Subsequent imaging included diagnostic mamogram showing pleomorphic calcifications an increased density extending over an 8-9 cm length of the outer left breast from the posterior one third of the breast extending to and involving the nipple and ultrasound showing no apparent breast mass although an abnormal left axillary lymph node was noted.   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 A second more posterior biopsy was negative for malignancy although was felt strongly to be discordant. An ultrasound guided biopsy was done of an abnormal appearing left axillary lymph node which was negative for malignancy.She is seen now in the office for initial treatment planning.  She has experienced no breast symptoms such as pain or mass or nipple discharge.  She does not have a personal history of any previous breast problems.  Findings at that time were the following:  Tumor size: 9 cm  Tumor grade: 1  Estrogen Receptor: positive Progesterone Receptor: positive  Her-2 neu: not performed  Lymph node status: negative Neurovascular invasion: no Lymphatic invasion: no  Past Medical History  Diagnosis Date  . Anxiety   . Depression   . Headache(784.0)     Past Surgical History  Procedure Laterality Date  . Colon surgery    . Incisional hernia repair    . R kidney tumor removed    . Hernia repair      Current Outpatient Prescriptions  Medication Sig Dispense Refill  . ALPRAZolam (XANAX) 0.5 MG tablet Take 1 tablet (0.5 mg total) by mouth 3 (three) times daily as needed for anxiety.  90  tablet  2  . atorvastatin (LIPITOR) 10 MG tablet Take 10 mg by mouth daily.      . butalbital-acetaminophen-caffeine (FIORICET, ESGIC) 50-325-40 MG per tablet       . DULoxetine (CYMBALTA) 60 MG capsule Take 1 capsule (60 mg total) by mouth 2 (two) times daily.  60 capsule  2  . lamoTRIgine (LAMICTAL) 100 MG tablet Take 1 tablet (100 mg total) by mouth daily.  30 tablet  2  . losartan (COZAAR) 100 MG tablet Take 100 mg by mouth daily.      . OLANZapine (ZYPREXA) 5 MG tablet Take 1 tablet (5 mg total) by mouth at bedtime.  30 tablet  2  . topiramate (TOPAMAX) 100 MG tablet        No current facility-administered medications for this visit.    Family History  Problem Relation Age of Onset  . Depression Mother   . Alcohol abuse Father     History   Social History  . Marital Status: Married    Spouse Name: N/A    Number of Children: N/A  . Years of Education: N/A   Social History Main Topics  . Smoking status: Former Smoker  . Smokeless tobacco: None  . Alcohol Use: No  . Drug Use: No  . Sexual Activity: Yes   Other Topics Concern  . None   Social History Narrative  . None     Review of Systems Constitutional: negative Respiratory: negative Cardiovascular: negative Gastrointestinal: negative Musculoskeletal:positive for arthralgias Behavioral/Psych: positive for anxiety     Objective:    BP 128/80  Pulse 87  Temp(Src) 97.1 F (36.2 C)  Ht 5' (1.524 m)  Wt 154 lb (69.854 kg)  BMI 30.08 kg/m2  General: Alert, mildly obese anxious appearing Caucasian female, in no distress Skin: Warm and dry without rash or infection. HEENT: No palpable masses or thyromegaly. Sclera nonicteric. Pupils equal round and reactive. Oropharynx clear. Breasts: post large core needle biopsy of the left. There is some mild nodularity around the nipple and laterally in the left breast possibly post biopsy. I cannot feel any other masses. No other skin changes. No palpable axillary  adenopathy. Lymph nodes: No cervical, supraclavicular, or inguinal nodes palpable. Lungs: Breath sounds clear and equal without increased work of breathing Cardiovascular: Regular rate and rhythm without murmur. No JVD or edema. Peripheral pulses intact. Abdomen: Nondistended. Soft and nontender. No masses palpable. No organomegaly. Long well-healed midline incision without apparent hernia Extremities: No edema or joint swelling or deformity. No chronic venous stasis changes. Neurologic: Alert and fully oriented. Gait normal.   Laboratory data:  CBC:  No results found for this basename: WBC, RBC, HGB, HCT, PLT  ]  CMG Labs:  No results found for this basename: GLUF, NA, K, CL, CO2, BUN, CREATININE, CALCIUM, PROT, ALB, BILITOT, BILIDIR, ALKPHOS, AST, ALT     Assessment  63 y.o. female with a new diagnosis of cancer of the the left breast involving the nipple and extending into the lateral posterior breast.  Clinical 0, estrogen receptor positive and progesterone receptor positive. I discussed with the patient and family members present today initial surgical treatment options. We discussed options of breast conservation with lumpectomy or total mastectomy and sentinal lymph node biopsy/dissection. Options for reconstruction were discussed. After discussion they have elected to proceed with left total mastectomy. 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 She would need at least a central mastectomy even with the nipple biopsy being positive and with the extensive radiologic findings in the breast I feel that total mastectomy would be the best choice and she is in agreement particularly with her overall anxiety level and difficulty with subsequent screening. We discussed the indications and nature of the procedure, and expected recovery, in detail. Surgical  risks including anesthetic complications, cardiorespiratory complications, bleeding, infection, wound healing complications, blood clots, lymphedema, local and distant recurrence and possible need for further surgery based on the final pathology was discussed and understood.  Chemotherapy, hormonal therapy and radiation therapy have been discussed. They have been provided with literature regarding the treatment of breast cancer.  All questions were answered.  She expressed interest in immediate reconstruction and we will go ahead with an immediate plastic surgery referral. Exact procedure and scheduling will be pending this referral.  Plan left total mastectomy with axillary sentinel lymph node biopsy possible reconstruction pending plastic surgery referral  Edward Jolly MD, FACS  11/16/2013, 10:18 AM

## 2013-12-07 ENCOUNTER — Ambulatory Visit (HOSPITAL_COMMUNITY): Payer: Self-pay | Admitting: Psychiatry

## 2013-12-09 ENCOUNTER — Ambulatory Visit (INDEPENDENT_AMBULATORY_CARE_PROVIDER_SITE_OTHER): Payer: Medicare HMO | Admitting: Psychiatry

## 2013-12-09 ENCOUNTER — Encounter (HOSPITAL_COMMUNITY): Payer: Self-pay | Admitting: Psychiatry

## 2013-12-09 VITALS — BP 110/78 | Ht 60.0 in | Wt 156.0 lb

## 2013-12-09 DIAGNOSIS — F332 Major depressive disorder, recurrent severe without psychotic features: Secondary | ICD-10-CM

## 2013-12-09 DIAGNOSIS — F331 Major depressive disorder, recurrent, moderate: Secondary | ICD-10-CM

## 2013-12-09 DIAGNOSIS — F411 Generalized anxiety disorder: Secondary | ICD-10-CM

## 2013-12-09 MED ORDER — OLANZAPINE 5 MG PO TABS: 5.0000 mg | ORAL_TABLET | Freq: Every day | ORAL | Status: DC

## 2013-12-09 MED ORDER — ALPRAZOLAM 0.5 MG PO TABS
0.5000 mg | ORAL_TABLET | Freq: Three times a day (TID) | ORAL | Status: DC | PRN
Start: 2013-12-09 — End: 2014-03-10

## 2013-12-09 MED ORDER — DULOXETINE HCL 60 MG PO CPEP: 60.0000 mg | ORAL_CAPSULE | Freq: Two times a day (BID) | ORAL | Status: DC

## 2013-12-09 MED ORDER — ALPRAZOLAM 0.5 MG PO TABS: 0.5000 mg | ORAL_TABLET | Freq: Three times a day (TID) | ORAL | Status: DC | PRN

## 2013-12-09 MED ORDER — LAMOTRIGINE 100 MG PO TABS: 100.0000 mg | ORAL_TABLET | Freq: Every day | ORAL | Status: DC

## 2013-12-09 NOTE — Progress Notes (Signed)
Patient ID: Caitlin [name], female   DOB: January 02, 1951, 63 y.o.   MRN: [id number] Patient ID: Caitlin [name], female   DOB: December 27, 1950, 63 y.o.   MRN: [id number] Patient ID: Caitlin [name], female   DOB: 25-Sep-1950, 63 y.o.   MRN: [id number] Patient ID: Caitlin [name], female   DOB: [birth year], 63 y.o.   MRN: [id number] Patient ID: Caitlin [name], female   DOB: Oct 19, 1950, 63 y.o.   MRN: [id number]  Psychiatric Assessment Adult  Patient Identification:  Caitlin [name] Date of Evaluation:  12/09/2013 Chief Complaint: "I have to have breast cancer surgery  History of Chief Complaint:  The patient has a history of depression and anxiety dating back to the late 90s  Chief Complaint  Patient presents with  . Anxiety  . Depression  . Follow-up    Anxiety Patient reports no nervous/anxious behavior.     this patient is a 63 year old married white female who lives with her husband in [place]. She has 4 children and 6 grandchildren. She worked in a [employer] until they let her go in 2005. She is self-referred.  The patient states that her depression started in the late 90s. She had to have an emergency colostomy due to diverticulosis. She had complications from the surgery and had to have a hernia repair and several other surgeries on her abdomen. She is left with a huge scar which lowered her self-confidence and self-esteem. She also suffers from migraine headaches.  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 Since then she's really gone downhill. She's become increasingly depressed and anxious. She's been seen at [facility] since 2009 but feels like the most recent psychiatrist does not listen to her and changed all the medications that were helpful. Currently she has lots of headaches, she's tired all the time and doesn't leave the house. She feels like she is a burden to everyone around her and has passive suicidal ideation but no plan. She sleeps okay her appetite has diminished.  In 2011 she was  admitted to [facility] because she was depressed and psychotic. She thinks this was a reaction to a medication that she got from migraine headache. She doesn't recall the name of the medicine. She was not hospitalized since then has had little therapy  Patient returns after  3 months. Since I last saw her she has been diagnosed with breast cancer which was discovered on a mammogram. She is going to have one breast removed as well as reconstruction. She is waiting right now to hear from the surgeons as to when her surgery will take place. It doesn't look like she needs chemotherapy or radiation therapy. She's very anxious right now because she doesn't know what could happen next. She is using the lorazepam more frequently which is fine. She is sleeping well in fact sleeps too much "to avoid thinking about it". She's trying to do things with family but overall she is very apprehensive about surgery  Review of Systems  Constitutional: Negative for fatigue.  Neurological: Negative for headaches.  Psychiatric/Behavioral: The patient is not nervous/anxious.    chronic migraine headaches Physical Exam not done  Depressive Symptoms: depressed mood, anhedonia, psychomotor retardation, fatigue, feelings of worthlessness/guilt, difficulty concentrating, hopelessness, impaired memory, suicidal thoughts without plan, anxiety, loss of energy/fatigue, weight loss,  (Hypo) Manic Symptoms:   Elevated Mood:  No Irritable Mood:  No Grandiosity:  No Distractibility:  No Labiality of Mood:  No Delusions:  No Hallucinations:  No  Impulsivity:  No Sexually Inappropriate Behavior:  No Financial Extravagance:  No Flight of Ideas:  No  Anxiety Symptoms: Excessive Worry:  yes Panic Symptoms:  Yes Agoraphobia:  Yes Obsessive Compulsive: Yes  Symptoms: None, Specific Phobias:  No Social Anxiety:  Yes  Psychotic Symptoms:  Hallucinations: No None Delusions:  No Paranoia:  No   Ideas of  Reference:  No  PTSD Symptoms: Ever had a traumatic exposure:  No Had a traumatic exposure in the last month:  No Re-experiencing: No None Hypervigilance:  No Hyperarousal: No None Avoidance: Yes Foreshortened Future  Traumatic Brain Injury: No   Past Psychiatric History: Diagnosis: Maj. 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 Anxiety   . Depression   . Headache(784.0)    History of Loss of Consciousness:  No Seizure History:  No Cardiac History:  No Allergies:see below   Allergies  Allergen Reactions  . Codeine     Nausea/vomiting   Current Medications:  Current Outpatient Prescriptions  Medication Sig Dispense Refill  . ALPRAZolam (XANAX) 0.5 MG tablet Take 1 tablet (0.5 mg total) by mouth 3 (three) times daily as needed for anxiety.  90 tablet  2  . atorvastatin (LIPITOR) 10 MG tablet Take 10 mg by mouth daily.      . butalbital-acetaminophen-caffeine (FIORICET, ESGIC) 50-325-40 MG per tablet       . DULoxetine (CYMBALTA) 60 MG capsule Take 1 capsule (60 mg total) by mouth 2 (two) times daily.  60 capsule  2  . lamoTRIgine (LAMICTAL) 100 MG tablet Take 1 tablet (100 mg total) by mouth daily.  30 tablet  2  . losartan (COZAAR) 100 MG tablet Take 100 mg by mouth daily.      Marland Kitchen OLANZapine (ZYPREXA) 5 MG tablet Take 1 tablet (5 mg total) by mouth at bedtime.  30 tablet  2  . topiramate (TOPAMAX) 100 MG tablet        No current facility-administered medications for this visit.    Previous Psychotropic Medications:  Medication Dose  Clonazepam 0.5 mg bid  Cymbalta  60 mg qhs  Lamictal 100 mg qhs               Substance Abuse  History in the last 12 months:none                                                                                                   Medical Consequences of Substance Abuse:n/a  Legal Consequences of Substance Abuse: n/a  Family Consequences of Substance Abuse: n/a  Blackouts:  No DT's:  No Withdrawal Symptoms:  No None  Social History: Current Place of Residence: [place], [place] Place of [place] Family Members: Husband 4 children 6 grandchildren Marital Status:  Married Children: see above  Sons: See above  Daughters: See above Relationships: See above Education:  HS Graduate Educational Problems/Performance: Religious Beliefs/Practices: Christian History of Abuse: none Occupational Experiences; former Solicitor History:  None. Legal History:none Hobbies/Interests: Used to love ceramics  Family History:   Family History  Problem Relation Age of Onset  . Depression Mother   . Alcohol abuse Father     Mental Status Examination/Evaluation: Objective:  Appearance: Well Groomed    Engineer, water::  Fair  Speech: Clear   Volume:  Normal   Mood: Anxious   Affect:  Congruent   Thought Process:  Coherent  Orientation:  Full (Time, Place, and Person)  Thought Content:  Negative  Suicidal Thoughts:  no  Homicidal Thoughts:  No  Judgement:  Good   Insight:  Good   Psychomotor Activity:  Decreased  Akathisia:  No  Handed:  Right  AIMS (if indicated):  n/a  Assets:  Communication Skills Desire for Improvement Intimacy Social Support Talents/Skills    Laboratory/X-Ray Psychological Evaluation(s)  We'll have these sent from her primary Dr.   none   Assessment: AXIS I Maj. depression, recurrent severe, generalized anxiety disorder  AXIS II Deferred  AXIS III Past Medical History  Diagnosis Date  . Anxiety   . Depression   . Headache(784.0)    migraine headache, history of colon resection and hernia repair, history of right kidney  tumor removal   AXIS IV minimal  AXIS V 41-50 serious symptoms   Treatment Plan/Recommendations:  Plan of Care: Medications and psychotherapy   Laboratory:  We will have these sent over from her primary physician   Psychotherapy: She tells me she really doesn't feel comfortable doing the counseling   Medications: She'll continue her current medications. She needs to take the Ativan 0.5 mg 3 times a day on a standing basis    Routine PRN Medications:  No  Consultations:none  Safety Concerns: She and her husband have been instructed to call if her depression or suicidal ideation worsens. She'll return to see me in 3 months   Other: none     [name], MD 6/19/201511:00 AM

## 2014-01-02 ENCOUNTER — Other Ambulatory Visit (INDEPENDENT_AMBULATORY_CARE_PROVIDER_SITE_OTHER): Payer: Self-pay | Admitting: General Surgery

## 2014-01-02 DIAGNOSIS — D0512 Intraductal carcinoma in situ of left breast: Secondary | ICD-10-CM

## 2014-01-30 ENCOUNTER — Encounter (HOSPITAL_COMMUNITY): Payer: Self-pay | Admitting: Pharmacy Technician

## 2014-01-31 NOTE — Pre-Procedure Instructions (Signed)
Caitlin [name]  01/31/2014   Your procedure is scheduled on:  Fri, Aug 21 @ 10:30 AM  Report to [facility]  at 8:30 AM.  Call this number if you have problems the morning of surgery: [phone]   Remember:   Do not eat food or drink liquids after midnight.   Take these medicines the morning of surgery with A SIP OF WATER: Alprazolam(Xanax),Cymbalta(Duloxetine),and Protonix(Pantoprazole)              No Goody's,BC's,Aleve,Aspirin,Ibuprofen,Fish Oil,or any Herbal Medications   Do not wear jewelry, make-up or nail polish.  Do not wear lotions, powders, or perfumes. You may wear deodorant.  Do not shave 48 hours prior to surgery.   Do not bring valuables to the hospital.  [facility] is not responsible                  for any belongings or valuables.               Contacts, dentures or bridgework may not be worn into surgery.  Leave suitcase in the car. After surgery it may be brought to your room.  For patients admitted to the hospital, discharge time is determined by your                treatment team.               Patients discharged the day of surgery will not be allowed to drive  home.    Special Instructions:  Caitlin [name] - Preparing for Surgery  Before surgery, you can play an important role.  Because skin is not sterile, your skin needs to be as free of germs as possible.  You can reduce the number of germs on you skin by washing with CHG (chlorahexidine gluconate) soap before surgery.  CHG is an antiseptic cleaner which kills germs and bonds with the skin to continue killing germs even after washing.  Please DO NOT use if you have an allergy to CHG or antibacterial soaps.  If your skin becomes reddened/irritated stop using the CHG and inform your nurse when you arrive at Short Stay.  Do not shave (including legs and underarms) for at least 48 hours prior to the first CHG shower.  You may shave your face.  Please follow these instructions carefully:   1.  Shower  with CHG Soap the night before surgery and the                                morning of Surgery.  2.  If you choose to wash your hair, wash your hair first as usual with your       normal shampoo.  3.  After you shampoo, rinse your hair and body thoroughly to remove the                      Shampoo.  4.  Use CHG as you would any other liquid soap.  You can apply chg directly       to the skin and wash gently with scrungie or a clean washcloth.  5.  Apply the CHG Soap to your body ONLY FROM THE NECK DOWN.        Do not use on open wounds or open sores.  Avoid contact with your eyes,       ears, mouth and genitals (private parts).  Wash genitals (private parts)       with your normal soap.  6.  Wash thoroughly, paying special attention to the area where your surgery        will be performed.  7.  Thoroughly rinse your body with warm water from the neck down.  8.  DO NOT shower/wash with your normal soap after using and rinsing off       the CHG Soap.  9.  Pat yourself dry with a clean towel.            10.  Wear clean pajamas.            11.  Place clean sheets on your bed the night of your first shower and do not        sleep with pets.  Day of Surgery  Do not apply any lotions/deoderants the morning of surgery.  Please wear clean clothes to the hospital/surgery center.     Please read over the following fact sheets that you were given: Pain Booklet, Coughing and Deep Breathing and Surgical Site Infection Prevention

## 2014-02-01 ENCOUNTER — Other Ambulatory Visit: Payer: Self-pay | Admitting: Plastic Surgery

## 2014-02-01 ENCOUNTER — Encounter (HOSPITAL_COMMUNITY): Payer: Self-pay

## 2014-02-01 ENCOUNTER — Encounter (HOSPITAL_COMMUNITY)
Admission: RE | Admit: 2014-02-01 | Discharge: 2014-02-01 | Disposition: A | Discharge status: Home / Self Care | Payer: Medicare HMO | Source: Ambulatory Visit | Attending: General Surgery | Admitting: General Surgery

## 2014-02-01 ENCOUNTER — Encounter (HOSPITAL_COMMUNITY)
Admission: RE | Admit: 2014-02-01 | Discharge: 2014-02-01 | Disposition: A | Discharge status: Home / Self Care | Payer: Medicare HMO | Source: Ambulatory Visit | Attending: Anesthesiology | Admitting: Anesthesiology

## 2014-02-01 DIAGNOSIS — I77810 Thoracic aortic ectasia: Secondary | ICD-10-CM | POA: Diagnosis not present

## 2014-02-01 DIAGNOSIS — R9431 Abnormal electrocardiogram [ECG] [EKG]: Secondary | ICD-10-CM | POA: Diagnosis not present

## 2014-02-01 DIAGNOSIS — Z01812 Encounter for preprocedural laboratory examination: Secondary | ICD-10-CM | POA: Insufficient documentation

## 2014-02-01 DIAGNOSIS — Z87891 Personal history of nicotine dependence: Secondary | ICD-10-CM | POA: Insufficient documentation

## 2014-02-01 DIAGNOSIS — I10 Essential (primary) hypertension: Secondary | ICD-10-CM | POA: Diagnosis not present

## 2014-02-01 DIAGNOSIS — Z01818 Encounter for other preprocedural examination: Secondary | ICD-10-CM | POA: Insufficient documentation

## 2014-02-01 DIAGNOSIS — Z9010 Acquired absence of unspecified breast and nipple: Secondary | ICD-10-CM | POA: Insufficient documentation

## 2014-02-01 DIAGNOSIS — Z01810 Encounter for preprocedural cardiovascular examination: Secondary | ICD-10-CM | POA: Insufficient documentation

## 2014-02-01 HISTORY — DX: Personal history of other diseases of the respiratory system: Z87.09

## 2014-02-01 HISTORY — DX: Diverticulitis of intestine, part unspecified, without perforation or abscess without bleeding: K57.92

## 2014-02-01 HISTORY — DX: Essential (primary) hypertension: I10

## 2014-02-01 HISTORY — DX: Pneumonia, unspecified organism: J18.9

## 2014-02-01 HISTORY — DX: Personal history of other medical treatment: Z92.89

## 2014-02-01 HISTORY — DX: Gastro-esophageal reflux disease without esophagitis: K21.9

## 2014-02-01 HISTORY — DX: Insomnia, unspecified: G47.00

## 2014-02-01 HISTORY — DX: Hyperlipidemia, unspecified: E78.5

## 2014-02-01 HISTORY — DX: Anemia, unspecified: D64.9

## 2014-02-01 HISTORY — DX: Personal history of other infectious and parasitic diseases: Z86.19

## 2014-02-01 HISTORY — DX: Unspecified cataract: H26.9

## 2014-02-01 HISTORY — DX: Personal history of urinary calculi: Z87.442

## 2014-02-01 LAB — BASIC METABOLIC PANEL
ANION GAP: 14 (ref 5–15)
BUN: 19 mg/dL (ref 6–23)
CALCIUM: 9.6 mg/dL (ref 8.4–10.5)
CO2: 20 meq/L (ref 19–32)
CREATININE: 1.36 mg/dL — AB (ref 0.50–1.10)
Chloride: 106 mEq/L (ref 96–112)
GFR calc Af Amer: 47 mL/min — ABNORMAL LOW (ref >90)
GFR, EST NON AFRICAN AMERICAN: 40 mL/min — AB (ref >90)
Glucose, Bld: 92 mg/dL (ref 70–99)
Potassium: 4.3 mEq/L (ref 3.7–5.3)
SODIUM: 140 meq/L (ref 137–147)

## 2014-02-01 LAB — CBC
HEMATOCRIT: 34.1 % — AB (ref 36.0–46.0)
Hemoglobin: 11.1 g/dL — ABNORMAL LOW (ref 12.0–15.0)
MCH: 28.3 pg (ref 26.0–34.0)
MCHC: 32.6 g/dL (ref 30.0–36.0)
MCV: 87 fL (ref 78.0–100.0)
PLATELETS: 225 10*3/uL (ref 150–400)
RBC: 3.92 MIL/uL (ref 3.87–5.11)
RDW: 14.4 % (ref 11.5–15.5)
WBC: 6 10*3/uL (ref 4.0–10.5)

## 2014-02-01 MED ORDER — CHLORHEXIDINE GLUCONATE 4 % EX LIQD: 1.0000 "application " | Freq: Once | CUTANEOUS | Status: DC

## 2014-02-01 NOTE — Progress Notes (Addendum)
  Pt doesn't have a cardiologist  Stress test done 15+yrs ago  Heart cath done about 15 yrs ago  Denies EKG or CXR in past yr  Medical Md is Dr.Dhruv Woody Seller

## 2014-02-02 ENCOUNTER — Other Ambulatory Visit (HOSPITAL_COMMUNITY): Payer: Self-pay

## 2014-02-02 NOTE — Progress Notes (Signed)
Anesthesia Chart Review:  Pt is 63 year old female posted for left total mastectomy with axillary sentinel lymph node biopsy with Dr. Excell Seltzer on 02/10/14.   PMH includes HTN, hyperlipidemia, anxiety, depression, new dx breast cancer. Former smoker. BMI consistent with obesity.   EKG dated 02/01/14 shows normal sinus rhythm, cannot rule out anterior infarct, age undetermined. Called pt's pcp, Dr. Woody Seller in [place], and no previous EKG is available there or in [place].   No CV symptoms documented in PAT, no hx CAD/MI/CHF or DM.   Preoperative labs reviewed. Chest x-ray reviewed.   Further eval by anesthesiologist on DOS. If not acute changes, I anticipate she can proceed with surgery.   Willeen Cass, FNP-BC [facility] Short Stay Surgical Center/Anesthesiology Phone: [phone] 02/02/2014 3:24 PM

## 2014-02-09 MED ORDER — CEFAZOLIN SODIUM-DEXTROSE 2-3 GM-% IV SOLR
2.0000 g | INTRAVENOUS | Status: AC
  Administered 2014-02-10: 2 g via INTRAVENOUS
  Filled 2014-02-09: qty 50

## 2014-02-10 ENCOUNTER — Encounter (HOSPITAL_COMMUNITY): Payer: Self-pay | Admitting: *Deleted

## 2014-02-10 ENCOUNTER — Inpatient Hospital Stay (HOSPITAL_COMMUNITY)
Admission: RE | Admit: 2014-02-10 | Discharge: 2014-02-12 | DRG: 578 | Disposition: A | Discharge status: Home / Self Care | Payer: Medicare HMO | Source: Ambulatory Visit | Attending: Plastic Surgery | Admitting: Plastic Surgery

## 2014-02-10 ENCOUNTER — Encounter (HOSPITAL_COMMUNITY)
Admission: RE | Disposition: A | Discharge status: Home / Self Care | Payer: Self-pay | Source: Ambulatory Visit | Attending: Plastic Surgery

## 2014-02-10 ENCOUNTER — Encounter (HOSPITAL_COMMUNITY)
Admission: RE | Admit: 2014-02-10 | Discharge: 2014-02-10 | Disposition: A | Discharge status: Home / Self Care | Payer: Medicare HMO | Source: Ambulatory Visit | Attending: General Surgery | Admitting: General Surgery

## 2014-02-10 ENCOUNTER — Encounter (HOSPITAL_COMMUNITY): Payer: Medicare HMO | Admitting: Emergency Medicine

## 2014-02-10 ENCOUNTER — Inpatient Hospital Stay (HOSPITAL_COMMUNITY): Payer: Medicare HMO | Admitting: Anesthesiology

## 2014-02-10 DIAGNOSIS — F3289 Other specified depressive episodes: Secondary | ICD-10-CM | POA: Diagnosis present

## 2014-02-10 DIAGNOSIS — C50112 Malignant neoplasm of central portion of left female breast: Secondary | ICD-10-CM | POA: Diagnosis present

## 2014-02-10 DIAGNOSIS — D0590 Unspecified type of carcinoma in situ of unspecified breast: Principal | ICD-10-CM | POA: Diagnosis present

## 2014-02-10 DIAGNOSIS — F411 Generalized anxiety disorder: Secondary | ICD-10-CM | POA: Diagnosis present

## 2014-02-10 DIAGNOSIS — F329 Major depressive disorder, single episode, unspecified: Secondary | ICD-10-CM | POA: Diagnosis present

## 2014-02-10 DIAGNOSIS — E669 Obesity, unspecified: Secondary | ICD-10-CM | POA: Diagnosis present

## 2014-02-10 DIAGNOSIS — Z87891 Personal history of nicotine dependence: Secondary | ICD-10-CM | POA: Diagnosis not present

## 2014-02-10 DIAGNOSIS — Z6830 Body mass index (BMI) 30.0-30.9, adult: Secondary | ICD-10-CM | POA: Diagnosis not present

## 2014-02-10 DIAGNOSIS — N63 Unspecified lump in unspecified breast: Secondary | ICD-10-CM | POA: Diagnosis present

## 2014-02-10 DIAGNOSIS — D0512 Intraductal carcinoma in situ of left breast: Secondary | ICD-10-CM

## 2014-02-10 DIAGNOSIS — Z79899 Other long term (current) drug therapy: Secondary | ICD-10-CM

## 2014-02-10 DIAGNOSIS — C50919 Malignant neoplasm of unspecified site of unspecified female breast: Secondary | ICD-10-CM

## 2014-02-10 HISTORY — PX: SIMPLE MASTECTOMY WITH AXILLARY SENTINEL NODE BIOPSY: SHX6098

## 2014-02-10 HISTORY — PX: BREAST RECONSTRUCTION WITH PLACEMENT OF TISSUE EXPANDER AND FLEX HD (ACELLULAR HYDRATED DERMIS): SHX6295

## 2014-02-10 SURGERY — SIMPLE MASTECTOMY WITH AXILLARY SENTINEL NODE BIOPSY
Anesthesia: General | Site: Breast | Laterality: Left

## 2014-02-10 MED ORDER — ROCURONIUM BROMIDE 50 MG/5ML IV SOLN
INTRAVENOUS | Status: AC
  Filled 2014-02-10: qty 1

## 2014-02-10 MED ORDER — CEFAZOLIN SODIUM 1-5 GM-% IV SOLN
1.0000 g | Freq: Three times a day (TID) | INTRAVENOUS | Status: DC
  Administered 2014-02-10 – 2014-02-12 (×5): 1 g via INTRAVENOUS
  Filled 2014-02-10 (×8): qty 50

## 2014-02-10 MED ORDER — ATORVASTATIN CALCIUM 10 MG PO TABS
10.0000 mg | ORAL_TABLET | Freq: Every morning | ORAL | Status: DC
  Administered 2014-02-11 – 2014-02-12 (×2): 10 mg via ORAL
  Filled 2014-02-10 (×3): qty 1

## 2014-02-10 MED ORDER — DEXAMETHASONE SODIUM PHOSPHATE 4 MG/ML IJ SOLN
INTRAMUSCULAR | Status: DC | PRN
  Administered 2014-02-10: 8 mg via INTRAVENOUS

## 2014-02-10 MED ORDER — HEPARIN SODIUM (PORCINE) 5000 UNIT/ML IJ SOLN
5000.0000 [IU] | Freq: Once | INTRAMUSCULAR | Status: AC
  Administered 2014-02-10: 5000 [IU] via SUBCUTANEOUS
  Filled 2014-02-10: qty 1

## 2014-02-10 MED ORDER — PROMETHAZINE HCL 25 MG/ML IJ SOLN: 6.2500 mg | INTRAMUSCULAR | Status: DC | PRN

## 2014-02-10 MED ORDER — TECHNETIUM TC 99M SULFUR COLLOID FILTERED: 1.0000 | Freq: Once | INTRAVENOUS | Status: AC | PRN

## 2014-02-10 MED ORDER — PROPOFOL 10 MG/ML IV BOLUS
INTRAVENOUS | Status: AC
  Filled 2014-02-10: qty 20

## 2014-02-10 MED ORDER — PROPOFOL 10 MG/ML IV BOLUS
INTRAVENOUS | Status: DC | PRN
  Administered 2014-02-10: 80 mg via INTRAVENOUS

## 2014-02-10 MED ORDER — MIDAZOLAM HCL 2 MG/2ML IJ SOLN
INTRAMUSCULAR | Status: AC
  Filled 2014-02-10: qty 2

## 2014-02-10 MED ORDER — NEOSTIGMINE METHYLSULFATE 10 MG/10ML IV SOLN
INTRAVENOUS | Status: DC | PRN
  Administered 2014-02-10: 4 mg via INTRAVENOUS

## 2014-02-10 MED ORDER — HEPARIN SODIUM (PORCINE) 5000 UNIT/ML IJ SOLN
5000.0000 [IU] | Freq: Three times a day (TID) | INTRAMUSCULAR | Status: DC
  Administered 2014-02-11 – 2014-02-12 (×4): 5000 [IU] via SUBCUTANEOUS
  Filled 2014-02-10 (×8): qty 1

## 2014-02-10 MED ORDER — ONDANSETRON HCL 4 MG/2ML IJ SOLN
INTRAMUSCULAR | Status: AC
  Filled 2014-02-10: qty 2

## 2014-02-10 MED ORDER — GLYCOPYRROLATE 0.2 MG/ML IJ SOLN
INTRAMUSCULAR | Status: AC
  Filled 2014-02-10: qty 3

## 2014-02-10 MED ORDER — DULOXETINE HCL 60 MG PO CPEP
60.0000 mg | ORAL_CAPSULE | Freq: Two times a day (BID) | ORAL | Status: DC
  Administered 2014-02-10 – 2014-02-12 (×4): 60 mg via ORAL
  Filled 2014-02-10 (×7): qty 1

## 2014-02-10 MED ORDER — LIDOCAINE HCL (CARDIAC) 20 MG/ML IV SOLN
INTRAVENOUS | Status: AC
  Filled 2014-02-10: qty 5

## 2014-02-10 MED ORDER — GLYCOPYRROLATE 0.2 MG/ML IJ SOLN
INTRAMUSCULAR | Status: DC | PRN
  Administered 2014-02-10: 0.6 mg via INTRAVENOUS

## 2014-02-10 MED ORDER — SODIUM CHLORIDE 0.9 % IJ SOLN
INTRAMUSCULAR | Status: AC
  Filled 2014-02-10: qty 10

## 2014-02-10 MED ORDER — PHENYLEPHRINE 40 MCG/ML (10ML) SYRINGE FOR IV PUSH (FOR BLOOD PRESSURE SUPPORT)
PREFILLED_SYRINGE | INTRAVENOUS | Status: AC
  Filled 2014-02-10: qty 10

## 2014-02-10 MED ORDER — 0.9 % SODIUM CHLORIDE (POUR BTL) OPTIME
TOPICAL | Status: DC | PRN
  Administered 2014-02-10: 1000 mL
  Administered 2014-02-10: 2000 mL

## 2014-02-10 MED ORDER — FENTANYL CITRATE 0.05 MG/ML IJ SOLN
50.0000 ug | INTRAMUSCULAR | Status: DC | PRN
  Administered 2014-02-10: 50 ug via INTRAVENOUS

## 2014-02-10 MED ORDER — TOPIRAMATE 100 MG PO TABS
100.0000 mg | ORAL_TABLET | Freq: Every day | ORAL | Status: DC
  Administered 2014-02-10 – 2014-02-11 (×2): 100 mg via ORAL
  Filled 2014-02-10 (×3): qty 1

## 2014-02-10 MED ORDER — PANTOPRAZOLE SODIUM 40 MG PO TBEC: 40.0000 mg | DELAYED_RELEASE_TABLET | Freq: Every day | ORAL | Status: DC | PRN

## 2014-02-10 MED ORDER — LAMOTRIGINE 100 MG PO TABS
100.0000 mg | ORAL_TABLET | Freq: Every day | ORAL | Status: DC
  Administered 2014-02-10 – 2014-02-11 (×2): 100 mg via ORAL
  Filled 2014-02-10 (×3): qty 1

## 2014-02-10 MED ORDER — FENTANYL CITRATE 0.05 MG/ML IJ SOLN
INTRAMUSCULAR | Status: AC
  Filled 2014-02-10: qty 5

## 2014-02-10 MED ORDER — ALPRAZOLAM 0.5 MG PO TABS: 0.5000 mg | ORAL_TABLET | Freq: Three times a day (TID) | ORAL | Status: DC | PRN

## 2014-02-10 MED ORDER — MIDAZOLAM HCL 2 MG/2ML IJ SOLN
1.0000 mg | INTRAMUSCULAR | Status: DC | PRN
  Administered 2014-02-10: 1 mg via INTRAVENOUS

## 2014-02-10 MED ORDER — DOCUSATE SODIUM 100 MG PO CAPS
100.0000 mg | ORAL_CAPSULE | Freq: Every day | ORAL | Status: DC
  Administered 2014-02-10 – 2014-02-12 (×3): 100 mg via ORAL
  Filled 2014-02-10 (×3): qty 1

## 2014-02-10 MED ORDER — NEOSTIGMINE METHYLSULFATE 10 MG/10ML IV SOLN
INTRAVENOUS | Status: AC
  Filled 2014-02-10: qty 1

## 2014-02-10 MED ORDER — OLANZAPINE 5 MG PO TABS
5.0000 mg | ORAL_TABLET | Freq: Every day | ORAL | Status: DC
  Administered 2014-02-10 – 2014-02-11 (×2): 5 mg via ORAL
  Filled 2014-02-10 (×3): qty 1

## 2014-02-10 MED ORDER — SODIUM CHLORIDE 0.9 % IJ SOLN
INTRAMUSCULAR | Status: DC | PRN
  Administered 2014-02-10: 12:00:00 via INTRAMUSCULAR

## 2014-02-10 MED ORDER — MIDAZOLAM HCL 2 MG/2ML IJ SOLN
INTRAMUSCULAR | Status: AC
  Administered 2014-02-10: 1 mg via INTRAVENOUS
  Filled 2014-02-10: qty 2

## 2014-02-10 MED ORDER — DEXTROSE-NACL 5-0.45 % IV SOLN
INTRAVENOUS | Status: DC
  Administered 2014-02-10 – 2014-02-11 (×2): via INTRAVENOUS

## 2014-02-10 MED ORDER — LACTATED RINGERS IV SOLN
INTRAVENOUS | Status: DC
  Administered 2014-02-10: 10:00:00 via INTRAVENOUS

## 2014-02-10 MED ORDER — METHOCARBAMOL 500 MG PO TABS: 500.0000 mg | ORAL_TABLET | Freq: Four times a day (QID) | ORAL | Status: DC | PRN

## 2014-02-10 MED ORDER — LACTATED RINGERS IV SOLN
INTRAVENOUS | Status: DC | PRN
  Administered 2014-02-10 (×3): via INTRAVENOUS

## 2014-02-10 MED ORDER — FENTANYL CITRATE 0.05 MG/ML IJ SOLN
INTRAMUSCULAR | Status: DC | PRN
  Administered 2014-02-10 (×6): 50 ug via INTRAVENOUS

## 2014-02-10 MED ORDER — LOSARTAN POTASSIUM 50 MG PO TABS
100.0000 mg | ORAL_TABLET | Freq: Every morning | ORAL | Status: DC
  Administered 2014-02-11 – 2014-02-12 (×2): 100 mg via ORAL
  Filled 2014-02-10 (×3): qty 2

## 2014-02-10 MED ORDER — PHENYLEPHRINE HCL 10 MG/ML IJ SOLN
INTRAMUSCULAR | Status: AC
  Filled 2014-02-10: qty 1

## 2014-02-10 MED ORDER — METHYLENE BLUE 1 % INJ SOLN
INTRAMUSCULAR | Status: AC
  Filled 2014-02-10: qty 10

## 2014-02-10 MED ORDER — FENTANYL CITRATE 0.05 MG/ML IJ SOLN
INTRAMUSCULAR | Status: AC
  Administered 2014-02-10: 50 ug via INTRAVENOUS
  Filled 2014-02-10: qty 2

## 2014-02-10 MED ORDER — PHENYLEPHRINE HCL 10 MG/ML IJ SOLN
INTRAMUSCULAR | Status: DC | PRN
  Administered 2014-02-10 (×2): 80 ug via INTRAVENOUS

## 2014-02-10 MED ORDER — SODIUM CHLORIDE 0.9 % IR SOLN
Status: DC | PRN
  Administered 2014-02-10: 1

## 2014-02-10 MED ORDER — ROCURONIUM BROMIDE 100 MG/10ML IV SOLN
INTRAVENOUS | Status: DC | PRN
  Administered 2014-02-10: 20 mg via INTRAVENOUS
  Administered 2014-02-10: 50 mg via INTRAVENOUS
  Administered 2014-02-10: 10 mg via INTRAVENOUS

## 2014-02-10 MED ORDER — ONDANSETRON HCL 4 MG/2ML IJ SOLN
INTRAMUSCULAR | Status: DC | PRN
  Administered 2014-02-10: 4 mg via INTRAVENOUS

## 2014-02-10 MED ORDER — PHENYLEPHRINE HCL 10 MG/ML IJ SOLN
10.0000 mg | INTRAVENOUS | Status: DC | PRN
  Administered 2014-02-10: 5 ug/min via INTRAVENOUS

## 2014-02-10 MED ORDER — SODIUM CHLORIDE 0.9 % IV SOLN
INTRAVENOUS | Status: AC
  Administered 2014-02-10: 13:00:00
  Filled 2014-02-10: qty 1

## 2014-02-10 MED ORDER — HYDROMORPHONE HCL 2 MG PO TABS
2.0000 mg | ORAL_TABLET | ORAL | Status: DC | PRN
  Administered 2014-02-10 – 2014-02-11 (×3): 4 mg via ORAL
  Filled 2014-02-10: qty 1
  Filled 2014-02-10: qty 2
  Filled 2014-02-10: qty 1
  Filled 2014-02-10: qty 2

## 2014-02-10 MED ORDER — DEXAMETHASONE SODIUM PHOSPHATE 4 MG/ML IJ SOLN
INTRAMUSCULAR | Status: AC
  Filled 2014-02-10: qty 2

## 2014-02-10 MED ORDER — MIDAZOLAM HCL 5 MG/5ML IJ SOLN
INTRAMUSCULAR | Status: DC | PRN
  Administered 2014-02-10 (×2): 1 mg via INTRAVENOUS

## 2014-02-10 MED ORDER — LIDOCAINE HCL (CARDIAC) 20 MG/ML IV SOLN
INTRAVENOUS | Status: DC | PRN
  Administered 2014-02-10: 50 mg via INTRAVENOUS

## 2014-02-10 SURGICAL SUPPLY — 79 items
ALLOGRAFT TISSUE 4X7 (Tissue Mesh) ×2 IMPLANT
ALLOGRAFT TISSUE 4X7CM (Tissue Mesh) ×1 IMPLANT
APPLIER CLIP 9.375 MED OPEN (MISCELLANEOUS)
ATCH SMKEVC FLXB CAUT HNDSWH (FILTER) ×1 IMPLANT
BAG DECANTER FOR FLEXI CONT (MISCELLANEOUS) ×3 IMPLANT
BINDER BREAST LRG (GAUZE/BANDAGES/DRESSINGS) IMPLANT
BINDER BREAST XLRG (GAUZE/BANDAGES/DRESSINGS) ×3 IMPLANT
BIOPATCH RED 1 DISK 7.0 (GAUZE/BANDAGES/DRESSINGS) ×4 IMPLANT
BIOPATCH RED 1IN DISK 7.0MM (GAUZE/BANDAGES/DRESSINGS) ×2
BLADE 10 SAFETY STRL DISP (BLADE) ×3 IMPLANT
CANISTER SUCTION 2500CC (MISCELLANEOUS) ×9 IMPLANT
CHLORAPREP W/TINT 26ML (MISCELLANEOUS) ×6 IMPLANT
CLIP APPLIE 9.375 MED OPEN (MISCELLANEOUS) IMPLANT
CLIP TI MEDIUM 6 (CLIP) ×3 IMPLANT
CONT SPEC 4OZ CLIKSEAL STRL BL (MISCELLANEOUS) ×3 IMPLANT
COVER PROBE W GEL 5X96 (DRAPES) ×3 IMPLANT
COVER SURGICAL LIGHT HANDLE (MISCELLANEOUS) ×6 IMPLANT
DERMABOND ADVANCED (GAUZE/BANDAGES/DRESSINGS) ×2
DERMABOND ADVANCED .7 DNX12 (GAUZE/BANDAGES/DRESSINGS) ×1 IMPLANT
DEVICE DISSECT PLASMABLAD 3.0S (MISCELLANEOUS) ×1 IMPLANT
DRAIN CHANNEL 19F RND (DRAIN) ×6 IMPLANT
DRAPE CHEST BREAST 15X10 FENES (DRAPES) ×3 IMPLANT
DRAPE ORTHO SPLIT 77X108 STRL (DRAPES) ×4
DRAPE PROXIMA HALF (DRAPES) ×6 IMPLANT
DRAPE SURG 17X23 STRL (DRAPES) ×12 IMPLANT
DRAPE SURG ORHT 6 SPLT 77X108 (DRAPES) ×2 IMPLANT
DRAPE UTILITY 15X26 W/TAPE STR (DRAPE) IMPLANT
DRAPE WARM FLUID 44X44 (DRAPE) ×3 IMPLANT
DRSG PAD ABDOMINAL 8X10 ST (GAUZE/BANDAGES/DRESSINGS) ×6 IMPLANT
DRSG SORBAVIEW 3.5X5-5/16 MED (GAUZE/BANDAGES/DRESSINGS) ×6 IMPLANT
ELECT BLADE 6.5 EXT (BLADE) IMPLANT
ELECT CAUTERY BLADE 6.4 (BLADE) ×6 IMPLANT
ELECT REM PT RETURN 9FT ADLT (ELECTROSURGICAL) ×6
ELECTRODE REM PT RTRN 9FT ADLT (ELECTROSURGICAL) ×2 IMPLANT
EVACUATOR SILICONE 100CC (DRAIN) ×6 IMPLANT
EVACUATOR SMOKE ACCUVAC VALLEY (FILTER) ×2
EXPANDER BREAST CONT 800CC (Breast) ×3 IMPLANT
GLOVE BIO SURGEON STRL SZ7.5 (GLOVE) ×3 IMPLANT
GLOVE BIOGEL PI IND STRL 7.0 (GLOVE) ×2 IMPLANT
GLOVE BIOGEL PI IND STRL 7.5 (GLOVE) ×1 IMPLANT
GLOVE BIOGEL PI IND STRL 8 (GLOVE) ×3 IMPLANT
GLOVE BIOGEL PI INDICATOR 7.0 (GLOVE) ×4
GLOVE BIOGEL PI INDICATOR 7.5 (GLOVE) ×2
GLOVE BIOGEL PI INDICATOR 8 (GLOVE) ×6
GLOVE ECLIPSE 8.0 STRL XLNG CF (GLOVE) ×3 IMPLANT
GLOVE SS BIOGEL STRL SZ 7.5 (GLOVE) ×1 IMPLANT
GLOVE SUPERSENSE BIOGEL SZ 7.5 (GLOVE) ×2
GLOVE SURG SS PI 7.0 STRL IVOR (GLOVE) ×3 IMPLANT
GOWN STRL REUS W/ TWL LRG LVL3 (GOWN DISPOSABLE) ×2 IMPLANT
GOWN STRL REUS W/ TWL XL LVL3 (GOWN DISPOSABLE) ×3 IMPLANT
GOWN STRL REUS W/TWL LRG LVL3 (GOWN DISPOSABLE) ×4
GOWN STRL REUS W/TWL XL LVL3 (GOWN DISPOSABLE) ×6
KIT BASIN OR (CUSTOM PROCEDURE TRAY) ×6 IMPLANT
KIT ROOM TURNOVER OR (KITS) ×6 IMPLANT
MARKER SKIN DUAL TIP RULER LAB (MISCELLANEOUS) ×3 IMPLANT
NEEDLE 18GX1X1/2 (RX/OR ONLY) (NEEDLE) ×3 IMPLANT
NEEDLE HYPO 25GX1X1/2 BEV (NEEDLE) ×3 IMPLANT
NS IRRIG 1000ML POUR BTL (IV SOLUTION) ×9 IMPLANT
PACK GENERAL/GYN (CUSTOM PROCEDURE TRAY) ×6 IMPLANT
PAD ARMBOARD 7.5X6 YLW CONV (MISCELLANEOUS) ×6 IMPLANT
PLASMABLADE 3.0S (MISCELLANEOUS) ×3
PREFILTER EVAC NS 1 1/3-3/8IN (MISCELLANEOUS) ×3 IMPLANT
SPECIMEN JAR X LARGE (MISCELLANEOUS) ×3 IMPLANT
SPONGE LAP 18X18 X RAY DECT (DISPOSABLE) ×6 IMPLANT
SUT ETHILON 2 0 FS 18 (SUTURE) IMPLANT
SUT MNCRL AB 3-0 PS2 18 (SUTURE) ×9 IMPLANT
SUT MNCRL AB 4-0 PS2 18 (SUTURE) IMPLANT
SUT PDS AB 3-0 SH 27 (SUTURE) ×6 IMPLANT
SUT PROLENE 3 0 PS 2 (SUTURE) ×6 IMPLANT
SUT VIC AB 3-0 54X BRD REEL (SUTURE) IMPLANT
SUT VIC AB 3-0 BRD 54 (SUTURE)
SUT VIC AB 3-0 SH 18 (SUTURE) ×9 IMPLANT
SYR BULB IRRIGATION 50ML (SYRINGE) ×3 IMPLANT
SYR CONTROL 10ML LL (SYRINGE) ×3 IMPLANT
TOWEL OR 17X24 6PK STRL BLUE (TOWEL DISPOSABLE) ×9 IMPLANT
TOWEL OR 17X26 10 PK STRL BLUE (TOWEL DISPOSABLE) ×6 IMPLANT
TRAY FOLEY CATH 14FRSI W/METER (CATHETERS) ×3 IMPLANT
TUBE CONNECTING 12'X1/4 (SUCTIONS) ×2
TUBE CONNECTING 12X1/4 (SUCTIONS) ×4 IMPLANT

## 2014-02-10 NOTE — Progress Notes (Signed)
Pt now awake and alert, blood pressure improved, husband at bedside. Pt remains on monitoring equipment, will monitor.

## 2014-02-10 NOTE — Transfer of Care (Signed)
Immediate Anesthesia Transfer of Care Note  Patient: Caitlin [name]  Procedure(s) Performed: Procedure(s): LEFT TOTAL MASTECTOMY WITH AXILLARY SENTINEL NODE BIOPSY (Left) LEFT BREAST RECONSTRUCTION WITH PLACEMENT OF TISSUE EXPANDER AND  FLEX HD (ACELLULAR HYDRATED DERMIS) (Left)  Patient Location: PACU  Anesthesia Type:General  Level of Consciousness: sedated  Airway & Oxygen Therapy: Patient Spontanous Breathing and Patient connected to face mask oxygen  Post-op Assessment: Report given to PACU RN and Post -op Vital signs reviewed and stable  Post vital signs: Reviewed and stable  Complications: No apparent anesthesia complications

## 2014-02-10 NOTE — Progress Notes (Signed)
Dr. Tresa Moore in to see pt, aware of low BP, will monitor pt. No new orders received.

## 2014-02-10 NOTE — Anesthesia Preprocedure Evaluation (Addendum)
Anesthesia Evaluation   Patient awake    Reviewed: Allergy & Precautions, H&P , NPO status , Patient's Chart, lab work & pertinent test results, reviewed documented beta blocker date and time   Airway Mallampati: II TM Distance: >3 FB     Dental  (+) Edentulous Upper, Upper Dentures, Edentulous Lower   Pulmonary former smoker,  breath sounds clear to auscultation        Cardiovascular hypertension, Pt. on medications Rhythm:Regular Rate:Normal     Neuro/Psych Anxiety Depression    GI/Hepatic   Endo/Other    Renal/GU      Musculoskeletal   Abdominal (+)  Abdomen: soft.    Peds  Hematology   Anesthesia Other Findings   Reproductive/Obstetrics                        Anesthesia Physical Anesthesia Plan  ASA: III  Anesthesia Plan: General   Post-op Pain Management:    Induction: Intravenous  Airway Management Planned: Oral ETT  Additional Equipment:   Intra-op Plan:   Post-operative Plan: Extubation in OR  Informed Consent: I have reviewed the patients History and Physical, chart, labs and discussed the procedure including the risks, benefits and alternatives for the proposed anesthesia with the patient or authorized representative who has indicated his/her understanding and acceptance.     Plan Discussed with:   Anesthesia Plan Comments:         Anesthesia Quick Evaluation

## 2014-02-10 NOTE — H&P (Signed)
History:   Caitlin [name] is a 62 y.o. postmenopausal female referred by Dr. 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 She recently presented for a screening mamogram revealing extensive irregular masslike density associated with calcifications in the left breast.. Subsequent imaging included diagnostic mamogram showing pleomorphic calcifications an increased density extending over an 8-9 cm length of the outer left breast from the posterior one third of the breast extending to and involving the nipple and ultrasound showing no apparent breast mass although an abnormal left axillary lymph node was noted. 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 A second more posterior biopsy was negative for malignancy although was felt strongly to be discordant. An ultrasound guided biopsy was done of an abnormal appearing left axillary lymph node which was negative for malignancy.She is admitted for total mastectomy, SLN Bx and reconstruction Findings at that time were the following:   Tumor size: 9 cm  Tumor grade: 1  Estrogen Receptor: positive  Progesterone Receptor: positive  Her-2 neu: not performed  Lymph node status: negative  Neurovascular invasion: no  Lymphatic invasion: no  Past Medical History   Diagnosis  Date   .  Anxiety    .  Depression    .  Headache(784.0)     Past Surgical History   Procedure  Laterality  Date   .  Colon surgery     .  Incisional hernia repair     .  R kidney tumor removed     .  Hernia repair      Current Outpatient Prescriptions   Medication  Sig  Dispense  Refill   .  ALPRAZolam (XANAX) 0.5 MG tablet  Take 1 tablet (0.5 mg total) by mouth 3 (three) times daily as needed for anxiety.  90 tablet  2   .  atorvastatin (LIPITOR) 10 MG tablet  Take 10 mg by mouth daily.     .  butalbital-acetaminophen-caffeine (FIORICET, ESGIC) 50-325-40 MG per tablet      .  DULoxetine  (CYMBALTA) 60 MG capsule  Take 1 capsule (60 mg total) by mouth 2 (two) times daily.  60 capsule  2   .  lamoTRIgine (LAMICTAL) 100 MG tablet  Take 1 tablet (100 mg total) by mouth daily.  30 tablet  2   .  losartan (COZAAR) 100 MG tablet  Take 100 mg by mouth daily.     .  OLANZapine (ZYPREXA) 5 MG tablet  Take 1 tablet (5 mg total) by mouth at bedtime.  30 tablet  2   .  topiramate (TOPAMAX) 100 MG tablet       No current facility-administered medications for this visit.    Family History   Problem  Relation  Age of Onset   .  Depression  Mother    .  Alcohol abuse  Father     History    Social History   .  Marital Status:  Married     Spouse Name:  N/A     Number of Children:  N/A   .  Years of Education:  N/A    Social History Main Topics   .  Smoking status:  Former Smoker   .  Smokeless tobacco:  None   .  Alcohol Use:  No   .  Drug Use:  No   .  Sexual Activity:  Yes    Other Topics  Concern   .    None    Social History Narrative   .  None   Review of Systems  Constitutional: negative  Respiratory: negative  Cardiovascular: negative  Gastrointestinal: negative  Musculoskeletal:positive for arthralgias  Behavioral/Psych: positive for anxiety  Objective:   BP 128/80  Pulse 87  Temp(Src) 97.1 F (36.2 C)  Ht 5' (1.524 m)  Wt 154 lb (69.854 kg)  BMI 30.08 kg/m2  General: Alert, mildly obese anxious appearing Caucasian female, in no distress  Skin: Warm and dry without rash or infection.  HEENT: No palpable masses or thyromegaly. Sclera nonicteric. Pupils equal round and reactive. Oropharynx clear.  Breasts: post large core needle biopsy of the left. There is some mild nodularity around the nipple and laterally in the left breast possibly post biopsy. I cannot feel any other masses. No other skin changes. No palpable axillary adenopathy.  Lymph nodes: No cervical, supraclavicular, or inguinal nodes palpable.  Lungs: Breath sounds clear and equal without increased  work of breathing  Cardiovascular: Regular rate and rhythm without murmur. No JVD or edema. Peripheral pulses intact.  Abdomen: Nondistended. Soft and nontender. No masses palpable. No organomegaly. Long well-healed midline incision without apparent hernia  Extremities: No edema or joint swelling or deformity. No chronic venous stasis changes.  Neurologic: Alert and fully oriented. Gait normal.   Laboratory data:  CBC:  No results found for this basename: WBC, RBC, HGB, HCT, PLT   ]  CMG Labs:  No results found for this basename: GLUF, NA, K, CL, CO2, BUN, CREATININE, CALCIUM, PROT, ALB, BILITOT, BILIDIR, ALKPHOS, AST, ALT    Assessment  62 y.o. female with a new diagnosis of cancer of the the left breast involving the nipple and extending into the lateral posterior breast. Clinical 0, estrogen receptor positive and progesterone receptor positive. I discussed with the patient and family members present today initial surgical treatment options. We discussed options of breast conservation with lumpectomy or total mastectomy and sentinal lymph node biopsy/dissection. Options for reconstruction were discussed. After discussion they have elected to proceed with left total mastectomy. 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 She would need at least a central mastectomy even with the nipple biopsy being positive and with the extensive radiologic findings in the breast I feel that total mastectomy would be the best choice and she is in agreement particularly with her overall anxiety level and difficulty with subsequent screening. We discussed the indications and nature of the procedure, and expected recovery, in detail. Surgical risks including anesthetic complications, cardiorespiratory complications, bleeding, infection, wound healing complications, blood clots,  lymphedema, local and distant recurrence and possible need for further surgery based on the final pathology was discussed and understood. Chemotherapy, hormonal therapy and radiation therapy have been discussed. They have been provided with literature regarding the treatment of breast cancer. All questions were answered. She expressed interest in immediate reconstruction and has seen Dr Bowers and immediate reconstruction is planned Plan  left total mastectomy with axillary sentinel lymph node biopsy with immediate reconstruction  T  MD, FACS  11/16/2013, 10:18 AM  

## 2014-02-10 NOTE — Anesthesia Postprocedure Evaluation (Signed)
Anesthesia Post Note  Patient: Caitlin [name]  Procedure(s) Performed: Procedure(s) (LRB): LEFT TOTAL MASTECTOMY WITH AXILLARY SENTINEL NODE BIOPSY (Left) LEFT BREAST RECONSTRUCTION WITH PLACEMENT OF TISSUE EXPANDER AND  FLEX HD (ACELLULAR HYDRATED DERMIS) (Left)  Anesthesia type: general  Patient location: PACU  Post pain: Pain level controlled  Post assessment: Patient's Cardiovascular Status Stable  Last Vitals:  Filed Vitals:   02/10/14 1622  BP: 148/53  Pulse: 77  Temp: 36.4 C  Resp: 17    Post vital signs: Reviewed and stable  Level of consciousness: sedated  Complications: No apparent anesthesia complications

## 2014-02-10 NOTE — Progress Notes (Signed)
Dr. Tresa Moore in to see pt after this nurse called to report low BP after IV Fentanyl and Versed. Pt awakens easily and is oriented. No new orders received, will monitor pt.

## 2014-02-10 NOTE — Brief Op Note (Signed)
02/10/2014  3:12 PM  PATIENT:  Caitlin [name]  63 y.o. female  PRE-OPERATIVE DIAGNOSIS:  DCIS left breast  POST-OPERATIVE DIAGNOSIS:  DCIS left breast  PROCEDURE:  Procedure(s): LEFT TOTAL MASTECTOMY WITH AXILLARY SENTINEL NODE BIOPSY (Left) LEFT BREAST RECONSTRUCTION WITH PLACEMENT OF TISSUE EXPANDER AND  FLEX HD (ACELLULAR HYDRATED DERMIS) (Left)  SURGEON:  Surgeon(s) and Role: Panel 1:    * Edward Jolly, MD - Primary    * Adin Hector, MD - Assisting  Panel 2:    * Crissie Reese, MD - Primary  PHYSICIAN ASSISTANT:   ASSISTANTS: none   ANESTHESIA:   general  EBL:  Total I/O In: 2200 [I.V.:2200] Out: 575 [Urine:525; Blood:50]  BLOOD ADMINISTERED:none  DRAINS: (2) Jackson-Pratt drain(s) with closed bulb suction in the left chest   LOCAL MEDICATIONS USED:  NONE  SPECIMEN:  No Specimen  DISPOSITION OF SPECIMEN:  N/A  COUNTS:  YES  TOURNIQUET:  * No tourniquets in log *  DICTATION: .Other Dictation: Dictation Number [id number]  PLAN OF CARE: Admit to inpatient   PATIENT DISPOSITION:  PACU - hemodynamically stable.   Delay start of Pharmacological VTE agent (>24hrs) due to surgical blood loss or risk of bleeding: no

## 2014-02-10 NOTE — Anesthesia Procedure Notes (Signed)
Procedure Name: Intubation Date/Time: 02/10/2014 12:16 PM Performed by: Maude Leriche D Pre-anesthesia Checklist: Patient identified, Emergency Drugs available, Suction available, Patient being monitored and Timeout performed Patient Re-evaluated:Patient Re-evaluated prior to inductionOxygen Delivery Method: Circle system utilized Preoxygenation: Pre-oxygenation with 100% oxygen Intubation Type: IV induction Ventilation: Mask ventilation without difficulty Laryngoscope Size: Miller and 2 Grade View: Grade I Tube type: Oral Tube size: 7.0 mm Number of attempts: 1 Airway Equipment and Method: Stylet Placement Confirmation: ETT inserted through vocal cords under direct vision,  positive ETCO2 and breath sounds checked- equal and bilateral Secured at: 20 cm Tube secured with: Tape Dental Injury: Teeth and Oropharynx as per pre-operative assessment

## 2014-02-11 MED ORDER — BUTALBITAL-APAP-CAFFEINE 50-325-40 MG PO TABS
1.0000 | ORAL_TABLET | Freq: Once | ORAL | Status: AC
  Administered 2014-02-11: 1 via ORAL
  Filled 2014-02-11: qty 1

## 2014-02-11 NOTE — Progress Notes (Signed)
Subjective: Sore but good pain control. Tolerating diet well.  Objective: Vital signs in last 24 hours: Temp:  [97.5 F (36.4 C)-99.3 F (37.4 C)] 99.3 F (37.4 C) (08/22 0532) Pulse Rate:  [77-95] 95 (08/22 0532) Resp:  [8-23] 17 (08/22 0532) BP: (83-148)/(43-64) 106/60 mmHg (08/22 0532) SpO2:  [94 %-100 %] 94 % (08/22 0532) Weight:  [157 lb (71.215 kg)] 157 lb (71.215 kg) (08/21 1622)  Intake/Output from previous day: 08/21 0701 - 08/22 0700 In: 3733.8 [P.O.:680; I.V.:2953.8; IV Piggyback:100] Out: 902 [Urine:675; Drains:177; Blood:50] Intake/Output this shift: Total I/O In: -  Out: 400 [Urine:400]  Operative sites: Mastectomy flaps viable. Tissue expander in good position. Drains functioning. Drainage thin. No evidence of bleeding or infection.  No results found for this basename: WBC, HGB, HCT, PLATELETS, NA, K, CL, CO2, BUN, CREATININE, GLU,  in the last 72 hours  Studies/Results: Nm Sentinel Node Inj-no Rpt (breast)  02/10/2014   CLINICAL DATA: Cancer left breast   Sulfur colloid was injected intradermally by the nuclear medicine  technologist for breast cancer sentinel node localization.     Assessment/Plan: Ambulate in haqll. D/C IV fluids. Continue antibiotic.   LOS: 1 day    Crissie Reese M 02/11/2014 8:52 AM

## 2014-02-11 NOTE — Op Note (Signed)
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 After extensive discussion regarding treatment options and risks detailed elsewhere we elected to proceed with left total mastectomy and sentinel lymph node biopsy and immediate reconstruction as initial surgical treatment for her breast cancer.    Procedure Detail:  Patient was brought to the operating room, placed in supine position on the operating table, and general endotracheal anesthesia induced. She received preoperative IV antibiotics. PAS were in place. Under sterile technique after performing the patient timeout I injected 5 cc of dilute methylene blue the left nipple and massaged this for several minutes. She had undergone injection of 1 mCi of technetium sulfur colloid intradermally around the left nipple preoperatively. An elliptical transverse incision was used encompassing the nipple areola complex. Skin and subcutaneous flaps were developed medially to the edge of the sternum, superiorly to just beneath the clavicle, inferiorly to the inframammary crease and laterally out to the anterior border latissimus dorsi. The breast was then reflected up off the chest wall preserving the pectoralis fascia. As the dissection progressed distally the specimen was freed from the edge of the pectoralis and serratus in the lower portion of the latissimus. The axilla was exposed and using the neoprobe I localized a hot area in the lower axilla and with blunt dissection a slightly enlarged node with blue dye was identified. This was completely excised with blunt cautery dissection and sent for  immediate diagnosis which was negative for malignancy. There were no other significant areas of increased counts or blue dye in the axilla. The wound was irrigated and hemostasis obtained. Dr. Harlow Mares then proceeded with reconstruction as planned.   Estimated Blood Loss:  Minimal         Drains: per Dr. Harlow Mares  Blood Given: none          Specimens: left total mastectomy and left axillary sentinel lymph node        Complications:  * No complications entered in OR log *         Disposition: Dr. Harlow Mares proceeded with immediate reconstruction         Condition: stable

## 2014-02-11 NOTE — Progress Notes (Signed)
Gave pt and family breast cancer bag and explained:   JP drain care, chart given for keeping record of drainage as well as JP holder Support groups/[employer] [facility] Husband has done JP care before and feels comfortable with it.

## 2014-02-12 MED ORDER — BUTALBITAL-APAP-CAFFEINE 50-325-40 MG PO TABS
1.0000 | ORAL_TABLET | Freq: Once | ORAL | Status: AC
  Administered 2014-02-12: 1 via ORAL
  Filled 2014-02-12: qty 1

## 2014-02-12 MED ORDER — DOXYCYCLINE HYCLATE 100 MG PO TABS: 100.0000 mg | ORAL_TABLET | Freq: Two times a day (BID) | ORAL | Status: DC

## 2014-02-12 MED ORDER — HYDROMORPHONE HCL 2 MG PO TABS: 2.0000 mg | ORAL_TABLET | ORAL | Status: DC | PRN

## 2014-02-12 MED ORDER — DOXYCYCLINE HYCLATE 100 MG PO TABS
100.0000 mg | ORAL_TABLET | Freq: Two times a day (BID) | ORAL | Status: DC
  Administered 2014-02-12: 100 mg via ORAL
  Filled 2014-02-12: qty 1

## 2014-02-12 MED ORDER — METHOCARBAMOL 500 MG PO TABS: 500.0000 mg | ORAL_TABLET | Freq: Four times a day (QID) | ORAL | Status: DC | PRN

## 2014-02-12 MED ORDER — ENOXAPARIN SODIUM 40 MG/0.4ML ~~LOC~~ SOLN
40.0000 mg | SUBCUTANEOUS | Status: DC
  Administered 2014-02-12: 40 mg via SUBCUTANEOUS
  Filled 2014-02-12: qty 0.4

## 2014-02-12 MED ORDER — DSS 100 MG PO CAPS: 100.0000 mg | ORAL_CAPSULE | Freq: Every day | ORAL | Status: DC

## 2014-02-12 MED ORDER — ENOXAPARIN SODIUM 40 MG/0.4ML ~~LOC~~ SOLN: 40.0000 mg | SUBCUTANEOUS | Status: DC

## 2014-02-12 NOTE — Discharge Instructions (Addendum)
No lifting for 6 weeks No vigorous activity for 6 weeks (including outdoor walks) No driving for 4 weeks OK to walk up stairs slowly Stay propped up Use incentive spirometer at home every hour while awake No shower while drains are in place Empty drains at least three times a day and record the amounts separately Change drain dressings every third day if instructed to do so by Dr. Harlow Mares  Apply Bacitracin antibiotic ointment to the drain sites  Place gauze dressing over drains  Secure the gauze with tape Take an over-the-counter Probiotic while on antibiotics Take an over-the-counter stool softener (such as Colace) while on pain medication See Dr. Harlow Mares this week in office. For questions call [phone] or [phone].

## 2014-02-12 NOTE — Discharge Summary (Signed)
Physician Discharge Summary  Patient ID: Caitlin [name] MRN: [id number] DOB/AGE: 1951/03/20 63 y.o.  Admit date: 02/10/2014 Discharge date: 02/12/2014  Admission Diagnoses:Left breast cancer  Discharge Diagnoses: Same Active Problems:   Breast cancer   Discharged Condition: good  Hospital Course: On the day of admission the patient was taken to surgery and had left mastectomy, sentinel node, and breast reconstruction with expander and small piece of Flex HD located infero-medial. The patient tolerated the procedures well. Postoperatively, skin flaps appear viable. The patient was ambulatory and tolerating diet on the first postoperative day. DVT prophylaxis was continued. She is ready for discharge.  Treatments: antibiotics: Ancef, anticoagulation: heparin and surgery: left mastectomy and reconstruction with tissue expander.  Discharge Exam: Blood pressure 101/64, pulse 88, temperature 99.2 F (37.3 C), temperature source Oral, resp. rate 20, height 5' (1.524 m), weight 157 lb (71.215 kg), SpO2 96.00%.  Operative sites: Mastectomy flaps viable. Tissue expander appears to be in good position. Drains functioning. Drainage thin. There is no evidence of bleeding, infection, or healing problem.  Disposition: Home, self-care.     Medication List    STOP taking these medications       cetirizine 10 MG tablet  Commonly known as:  ZYRTEC     multivitamin with minerals Tabs tablet      TAKE these medications       ALPRAZolam 0.5 MG tablet  Commonly known as:  XANAX  Take 1 tablet (0.5 mg total) by mouth 3 (three) times daily as needed for anxiety.     atorvastatin 10 MG tablet  Commonly known as:  LIPITOR  Take 10 mg by mouth every morning.     butalbital-acetaminophen-caffeine 50-325-40 MG per tablet  Commonly known as:  FIORICET, ESGIC  Take 1 tablet by mouth 2 (two) times daily as needed for headache.     doxycycline 100 MG tablet  Commonly known as:  VIBRA-TABS  Take 1  tablet (100 mg total) by mouth every 12 (twelve) hours.     DSS 100 MG Caps  Take 100 mg by mouth daily.     DULoxetine 60 MG capsule  Commonly known as:  CYMBALTA  Take 1 capsule (60 mg total) by mouth 2 (two) times daily.     enoxaparin 40 MG/0.4ML injection  Commonly known as:  LOVENOX  Inject 0.4 mLs (40 mg total) into the skin daily.     HYDROmorphone 2 MG tablet  Commonly known as:  DILAUDID  Take 1-2 tablets (2-4 mg total) by mouth every 4 (four) hours as needed for moderate pain.     lamoTRIgine 100 MG tablet  Commonly known as:  LAMICTAL  Take 100 mg by mouth at bedtime.     losartan 100 MG tablet  Commonly known as:  COZAAR  Take 100 mg by mouth every morning.     methocarbamol 500 MG tablet  Commonly known as:  ROBAXIN  Take 1 tablet (500 mg total) by mouth every 6 (six) hours as needed for muscle spasms.     OLANZapine 5 MG tablet  Commonly known as:  ZYPREXA  Take 5 mg by mouth at bedtime.     pantoprazole 40 MG tablet  Commonly known as:  PROTONIX  Take 40 mg by mouth daily as needed (for acid reflux).     topiramate 100 MG tablet  Commonly known as:  TOPAMAX  Take 100 mg by mouth at bedtime.           Follow-up Information  Follow up with Excell Seltzer T, MD. Schedule an appointment as soon as possible for a visit in 1 week.   Specialty:  General Surgery   Contact information:   [address] [place] [address] [phone]       Signed: Macon Large 02/12/2014, 10:44 AM

## 2014-02-12 NOTE — Progress Notes (Signed)
[name] to be D/C'd Home per MD order.  Discussed with the patient and all questions fully answered.  VSS, Skin clean, dry and intact without evidence of skin break down, no evidence of skin tears noted.  Surgical incision clean, dry, intact with no evidence of infection or hematoma.  IV catheter discontinued intact. Site without signs and symptoms of complications. Dressing and pressure applied.  An After Visit Summary was printed and given to the patient.  Patient and husband received teaching regarding JP drain care and Lovenox shot administration.  Husband able to return demonstrate.  Patient received all prescriptions.  D/c education completed with patient/family including follow up instructions, medication list, d/c activities limitations if indicated, with other d/c instructions as indicated by MD - patient able to verbalize understanding, all questions fully answered.   Patient instructed to return to ED, call 911, or call MD for any changes in condition.   Patient escorted via [employer], and D/C home via private auto.  Micki Riley 02/12/2014 12:16 PM

## 2014-02-12 NOTE — Progress Notes (Signed)
2 Days Post-Op  Subjective: Feels ok, pain less  Objective: Vital signs in last 24 hours: Temp:  [98.6 F (37 C)-100.6 F (38.1 C)] 99.6 F (37.6 C) (08/23 0619) Pulse Rate:  [54-105] 88 (08/23 0619) Resp:  [18-20] 20 (08/23 0619) BP: (99-121)/(61-65) 101/64 mmHg (08/23 0619) SpO2:  [94 %-98 %] 96 % (08/23 0619) Last BM Date: 02/09/14  Intake/Output from previous day: 08/22 0701 - 08/23 0700 In: 1371.3 [P.O.:1080; I.V.:241.3; IV Piggyback:50] Out: 3202 [Urine:1450; Drains:165] Intake/Output this shift:    Drains serosang  Lab Results:  No results found for this basename: WBC, HGB, HCT, PLT,  in the last 72 hours BMET No results found for this basename: NA, K, CL, CO2, GLUCOSE, BUN, CREATININE, CALCIUM,  in the last 72 hours PT/INR No results found for this basename: LABPROT, INR,  in the last 72 hours ABG No results found for this basename: PHART, PCO2, PO2, HCO3,  in the last 72 hours  Studies/Results: Nm Sentinel Node Inj-no Rpt (breast)  02/10/2014   CLINICAL DATA: Cancer left breast   Sulfur colloid was injected intradermally by the nuclear medicine  technologist for breast cancer sentinel node localization.     Anti-infectives: Anti-infectives   Start     Dose/Rate Route Frequency Ordered Stop   02/10/14 1645  ceFAZolin (ANCEF) IVPB 1 g/50 mL premix     1 g 100 mL/hr over 30 Minutes Intravenous 3 times per day 02/10/14 1635     02/10/14 0945  bacitracin 50,000 Units, gentamicin (GARAMYCIN) 80 mg, ceFAZolin (ANCEF) 1 g in sodium chloride 0.9 % 1,000 mL      Irrigation To Surgery 02/10/14 0944 02/10/14 1242   02/10/14 0600  ceFAZolin (ANCEF) IVPB 2 g/50 mL premix     2 g 100 mL/hr over 30 Minutes Intravenous On call to O.R. 02/09/14 1428 02/10/14 1209      Assessment/Plan: s/p Procedure(s): LEFT TOTAL MASTECTOMY WITH AXILLARY SENTINEL NODE BIOPSY (Left) LEFT BREAST RECONSTRUCTION WITH PLACEMENT OF TISSUE EXPANDER AND  FLEX HD (ACELLULAR HYDRATED DERMIS)  (Left)  Discharge per Dr. Harlow Mares  LOS: 2 days    Caitlin [name] 02/12/2014

## 2014-02-13 NOTE — Op Note (Signed)
Caitlin [name], [name]                  ACCOUNT NO.:  [id number]  MEDICAL RECORD NO.:  [id number]  LOCATION:                                 FACILITY:  PHYSICIAN:  Crissie Reese, M.D.     DATE OF BIRTH:  [birth year]  DATE OF PROCEDURE:  02/10/2014 DATE OF DISCHARGE:  02/10/2014                              OPERATIVE REPORT   PREOPERATIVE DIAGNOSIS:  Left breast cancer.  POSTOPERATIVE DIAGNOSIS:  Left breast cancer.  PROCEDURE PERFORMED: 1. Left breast reconstruction with tissue expander. 2. Chest wall reconstruction with acellular dermal matrix for     resetting inframammary crease as well as inadequate muscle (distinct procedure).  SURGEON:  Crissie Reese, M.D.  ANESTHESIA:  General.  ESTIMATED BLOOD LOSS:  20 mL.  DRAINS:  Two 19-French.  CLINICAL NOTE:  63 year old woman has left breast cancer and is having mastectomy.  She desired reconstruction.  Options were discussed and she did select placement of tissue expander as a staged procedure for eventual placement of the implant.  The nature of the procedure and the risks were discussed with her including the possible use of acellular dermal matrix.  The nature of that material as well as the rationale for its use was explained, and she agreed with its use if needed.  Risks were discussed including, but not limited to, bleeding, infection, healing problems, scarring, loss of sensation, fluid accumulations, anesthesia-related complications, pneumothorax, DVT, PE, failure of device, capsular contracture, displacement of device, wrinkles and ripples, contour deformities at the periphery of the reconstruction, as well as asymmetry, chronic pain, and overall disappointment.  She understood all of this as well as possibility of skin loss and the mastectomy flaps and wished to proceed.  DESCRIPTION OF PROCEDURE:  The patient was in the operating room and General Surgery completed the mastectomy.  Skin flaps were inspected and found  to be in good condition and appeared to be viable.  Thorough irrigation with saline.  Submuscular space was developed deep to the pectoralis major muscle.  There was an area at the inferomedial aspect that was lacking in muscle coverage.  Serratus anterior was raised for a short distance laterally.  Meticulous hemostasis with electrocautery and then irrigation with saline as well as antibiotic solution.  After hemostasis having been achieved, the expander was prepared.  This was an 800 mL Mentor tissue expander.  After thoroughly cleaning gloves, all the air removed and 150 mL of sterile saline placed using a closed filling system.  Space inspected and found to have excellent hemostasis. The expander was positioned and care was taken to make sure it was oriented properly without folds or wrinkles.  A fixation suture was placed at the lateral aspect.  The muscle closure with 3-0 Vicryl interrupted sutures.  Acellular dermal matrix (Flex HD) was soaked in saline for greater than 10 minutes and then soaked in antibiotic solution.  It was pie-crusted and stripped of fluid and then positioned with care taken to position it with the epidermal side facing the tissue expander and the dermal side facing up towards the overlying mastectomy flap.  It was secured around its periphery using 3-0 PDS running simple suture.  Care was taken to avoid damage to the underlying tissue expander, which was kept under direct vision at all times.  Antibiotic solution was placed again just prior to positioning the acellular dermal matrix as well as after it had been fixed in place.  Two 19-French drains were positioned, brought out through separate stab wounds inferiorly, and secured with 3-0 Prolene sutures.  Again meticulous hemostasis with electrocautery.  The skin closure with 3-0 Monocryl interrupted inverted deep dermal sutures and a few interspersed 3-0 Prolene simple suture.  Dermabond applied.  Biopatch  and dry sterile dressings around the drains occlusive and then dry sterile dressings over the mastectomy site and the chest binder, and she was transferred to the recovery room stable having tolerated the procedure well.     Crissie Reese, M.D.     DB/MEDQ  D:  02/10/2014  T:  02/10/2014  Job:  [id number]

## 2014-02-14 ENCOUNTER — Encounter (HOSPITAL_COMMUNITY): Payer: Self-pay | Admitting: General Surgery

## 2014-02-14 ENCOUNTER — Telehealth (INDEPENDENT_AMBULATORY_CARE_PROVIDER_SITE_OTHER): Payer: Self-pay | Admitting: General Surgery

## 2014-02-14 NOTE — Telephone Encounter (Signed)
Call the patient, she was asleep I spoke with her husband and we discussed the pathology report. She is doing well.

## 2014-02-23 ENCOUNTER — Other Ambulatory Visit: Payer: Self-pay | Admitting: Plastic Surgery

## 2014-03-10 ENCOUNTER — Ambulatory Visit (INDEPENDENT_AMBULATORY_CARE_PROVIDER_SITE_OTHER): Payer: Medicare HMO | Admitting: Psychiatry

## 2014-03-10 ENCOUNTER — Encounter (HOSPITAL_COMMUNITY): Payer: Self-pay | Admitting: Psychiatry

## 2014-03-10 VITALS — BP 102/63 | HR 100 | Ht 60.0 in | Wt 154.0 lb

## 2014-03-10 DIAGNOSIS — F332 Major depressive disorder, recurrent severe without psychotic features: Secondary | ICD-10-CM

## 2014-03-10 DIAGNOSIS — F331 Major depressive disorder, recurrent, moderate: Secondary | ICD-10-CM

## 2014-03-10 DIAGNOSIS — F411 Generalized anxiety disorder: Secondary | ICD-10-CM

## 2014-03-10 MED ORDER — ALPRAZOLAM 0.5 MG PO TABS: 0.5000 mg | ORAL_TABLET | Freq: Three times a day (TID) | ORAL | Status: DC | PRN

## 2014-03-10 MED ORDER — LAMOTRIGINE 100 MG PO TABS: 100.0000 mg | ORAL_TABLET | Freq: Every day | ORAL | Status: DC

## 2014-03-10 MED ORDER — OLANZAPINE 5 MG PO TABS: 5.0000 mg | ORAL_TABLET | Freq: Every day | ORAL | Status: DC

## 2014-03-10 MED ORDER — DULOXETINE HCL 60 MG PO CPEP: 60.0000 mg | ORAL_CAPSULE | Freq: Two times a day (BID) | ORAL | Status: DC

## 2014-03-10 NOTE — Progress Notes (Signed)
Patient ID: DELORUS LANGWELL, female   DOB: 09-20-1950, 63 y.o.   MRN: [id number] Patient ID: DAMARIZ PAGANELLI, female   DOB: Sep 28, 1950, 63 y.o.   MRN: [id number] Patient ID: INELLA KUWAHARA, female   DOB: [birth year], 63 y.o.   MRN: [id number] Patient ID: LANIECE HORNBAKER, female   DOB: 01-09-1951, 63 y.o.   MRN: [id number] Patient ID: SHANDI GODFREY, female   DOB: May 24, 1951, 63 y.o.   MRN: [id number] Patient ID: ZUMA HUST, female   DOB: Aug 17, 1950, 63 y.o.   MRN: [id number]  Psychiatric Assessment Adult  Patient Identification:  Caitlin [name] Date of Evaluation:  03/10/2014 Chief Complaint: "I have to have breast cancer surgery  History of Chief Complaint:  The patient has a history of depression and anxiety dating back to the late 90s  Chief Complaint  Patient presents with  . Anxiety  . Depression  . Follow-up    Anxiety Patient reports no nervous/anxious behavior.     this patient is a 63 year old married white female who lives with her husband in [place]. She has 4 children and 6 grandchildren. She worked in a [employer] until they let her go in 2005. She is self-referred.  The patient states that her depression started in the late 90s. She had to have an emergency colostomy due to diverticulosis. She had complications from the surgery and had to have a hernia repair and several other surgeries on her abdomen. She is left with a huge scar which lowered her self-confidence and self-esteem. She also suffers from migraine headaches.  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 Since then she's really gone downhill. She's become increasingly depressed and anxious. She's been seen at [facility] since 2009 but feels like the most recent psychiatrist does not listen to her and changed all the medications that were helpful. Currently she has lots of headaches, she's tired all the time and doesn't leave the house. She feels like she is a burden to everyone around her and has passive suicidal  ideation but no plan. She sleeps okay her appetite has diminished.  In 2011 she was admitted to [facility] because she was depressed and psychotic. She thinks this was a reaction to a medication that she got from migraine headache. She doesn't recall the name of the medicine. She was not hospitalized since then has had little therapy  Patient returns after  3 months.  She had mastectomy of her left breast last month. She's got good okay and does not need chemotherapy or radiation. She's still very tired and can't do a whole lot. Her mood has been holding up and she is sleeping well but her energy is very low Review of Systems  Constitutional: Negative for fatigue.  Neurological: Negative for headaches.  Psychiatric/Behavioral: The patient is not nervous/anxious.    chronic migraine headaches Physical Exam not done  Depressive Symptoms: depressed mood, anhedonia, psychomotor retardation, fatigue, feelings of worthlessness/guilt, difficulty concentrating, hopelessness, impaired memory, suicidal thoughts without plan, anxiety, loss of energy/fatigue, weight loss,  (Hypo) Manic Symptoms:   Elevated Mood:  No Irritable Mood:  No Grandiosity:  No Distractibility:  No Labiality of Mood:  No Delusions:  No Hallucinations:  No Impulsivity:  No Sexually Inappropriate Behavior:  No Financial Extravagance:  No Flight of Ideas:  No  Anxiety Symptoms: Excessive Worry:  yes Panic Symptoms:  Yes Agoraphobia:  Yes Obsessive Compulsive: Yes  Symptoms: None, Specific Phobias:  No  Social Anxiety:  Yes  Psychotic Symptoms:  Hallucinations: No None Delusions:  No Paranoia:  No   Ideas of Reference:  No  PTSD Symptoms: Ever had a traumatic exposure:  No Had a traumatic exposure in the last month:  No Re-experiencing: No None Hypervigilance:  No Hyperarousal: No None Avoidance: Yes Foreshortened Future  Traumatic Brain Injury: No   Past Psychiatric  History: Diagnosis: Maj. 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 Hyperlipidemia     takes Atorvastatin daily  . Anxiety     takes Xanax daily as needed  . Depression     takes Cymbalta daily  . Hypertension     takes Losaratn daily  . Insomnia     takes Zyprexa nightly  . Pneumonia around 2005  . History of bronchitis around 2005  . Headache(784.0)     takes Topamax nightly;last migraine has been a while  . GERD (gastroesophageal reflux disease)     takes Pantoprazole daily as needed  . Diverticulitis   . History of kidney stones   . Anemia many yrs ago    not on any meds  . History of blood transfusion     no abnormal reaction noted  . Cataracts, bilateral     immature  . History of shingles    History of Loss of Consciousness:  No Seizure History:  No Cardiac History:  No Allergies:see below   Allergies  Allergen Reactions  . Codeine     Nausea/vomiting   Current Medications:  Current Outpatient Prescriptions  Medication Sig Dispense Refill  . ALPRAZolam (XANAX) 0.5 MG tablet Take 1 tablet (0.5 mg total) by mouth 3 (three) times daily as needed for anxiety.  90 tablet  2  . atorvastatin (LIPITOR) 10 MG tablet Take 10 mg by mouth every morning.       . butalbital-acetaminophen-caffeine (FIORICET, ESGIC) 50-325-40 MG per tablet Take 1 tablet by mouth 2 (two) times daily as needed for headache.       . docusate sodium 100 MG CAPS Take 100 mg by mouth daily.  10 capsule  0  . DULoxetine (CYMBALTA) 60 MG capsule Take 1 capsule (60 mg total) by mouth 2 (two) times daily.  60 capsule  2  . HYDROmorphone (DILAUDID) 2 MG tablet Take 1-2  tablets (2-4 mg total) by mouth every 4 (four) hours as needed for moderate pain.  30 tablet  0  . lamoTRIgine (LAMICTAL) 100 MG tablet Take 1 tablet (100 mg total) by mouth at bedtime.  30 tablet  2  . losartan (COZAAR) 100 MG tablet Take 100 mg by mouth every morning.       . methocarbamol (ROBAXIN) 500 MG tablet Take 1 tablet (500 mg total) by mouth every 6 (six) hours as needed for muscle spasms.  40 tablet  1  . OLANZapine (ZYPREXA) 5 MG tablet Take 1 tablet (5 mg total) by mouth at bedtime.  30 tablet  2  . pantoprazole (PROTONIX) 40 MG tablet Take 40 mg by mouth daily as needed (for acid reflux).      . topiramate (TOPAMAX) 100 MG tablet Take 100 mg by mouth at  bedtime.      . enoxaparin (LOVENOX) 40 MG/0.4ML injection Inject 0.4 mLs (40 mg total) into the skin daily.  12 Syringe  0   No current facility-administered medications for this visit.    Previous Psychotropic Medications:  Medication Dose  Clonazepam 0.5 mg bid  Cymbalta  60 mg qhs  Lamictal 100 mg qhs               Substance Abuse History in the last 12 months:none                                                                                                   Medical Consequences of Substance Abuse:n/a  Legal Consequences of Substance Abuse: n/a  Family Consequences of Substance Abuse: n/a  Blackouts:  No DT's:  No Withdrawal Symptoms:  No None  Social History: Current Place of Residence: [place], [place] Family Members: Husband 4 children 6 grandchildren Marital Status:  Married Children: see above  Sons: See above  Daughters: See above Relationships: See above Education:  HS Graduate Educational Problems/Performance: Religious Beliefs/Practices: Christian History of Abuse: none Pensions consultant; former Solicitor History:  None. Legal History:none Hobbies/Interests: Used to love ceramics  Family History:   Family History  Problem Relation  Age of Onset  . Depression Mother   . Alcohol abuse Father     Mental Status Examination/Evaluation: Objective:  Appearance: Well Groomed    Engineer, water::  Fair  Speech: Clear   Volume:  Normal   Mood: Tired   Affect:  Congruent   Thought Process:  Coherent  Orientation:  Full (Time, Place, and Person)  Thought Content:  Negative  Suicidal Thoughts:  no  Homicidal Thoughts:  No  Judgement:  Good   Insight:  Good   Psychomotor Activity:  Decreased  Akathisia:  No  Handed:  Right  AIMS (if indicated):  n/a  Assets:  Communication Skills Desire for Improvement Intimacy Social Support Talents/Skills    Laboratory/X-Ray Psychological Evaluation(s)  We'll have these sent from her primary Dr.   none   Assessment: AXIS I Maj. depression, recurrent severe, generalized anxiety disorder  AXIS II Deferred  AXIS III Past Medical History  Diagnosis Date  . Hyperlipidemia     takes Atorvastatin daily  . Anxiety     takes Xanax daily as needed  . Depression     takes Cymbalta daily  . Hypertension     takes Losaratn daily  . Insomnia     takes Zyprexa nightly  . Pneumonia around 2005  . History of bronchitis around 2005  . Headache(784.0)     takes Topamax nightly;last migraine has been a while  . GERD (gastroesophageal reflux disease)     takes Pantoprazole daily as needed  . Diverticulitis   . History of kidney stones   . Anemia many yrs ago    not on any meds  . History of blood transfusion     no abnormal reaction noted  . Cataracts, bilateral     immature  . History of shingles  migraine headache, history of colon resection and hernia repair, history of right kidney tumor removal   AXIS IV minimal  AXIS V 41-50 serious symptoms   Treatment Plan/Recommendations:  Plan of Care: Medications and psychotherapy   Laboratory:  We will have these sent over from her primary physician   Psychotherapy: She tells me she really doesn't feel comfortable doing the  counseling   Medications: She'll continue her current medications.    Routine PRN Medications:  No  Consultations:none  Safety Concerns: She and her husband have been instructed to call if her depression or suicidal ideation worsens. She'll return to see me in 2 months   Other: none     [name], MD 9/18/201511:49 AM

## 2014-03-14 ENCOUNTER — Telehealth: Payer: Self-pay | Admitting: *Deleted

## 2014-03-14 ENCOUNTER — Other Ambulatory Visit (INDEPENDENT_AMBULATORY_CARE_PROVIDER_SITE_OTHER): Payer: Self-pay

## 2014-03-14 DIAGNOSIS — C50919 Malignant neoplasm of unspecified site of unspecified female breast: Secondary | ICD-10-CM

## 2014-03-14 NOTE — Telephone Encounter (Signed)
Called and spoke with pt's husband and she is scheduled for more surgery on 10/2 with Dr. Harlow Mares and then they are looking at another surgery after that.  He does not want to schedule a med onc appt until after she is finished with all surgeries.  He also stated that since they lived in [place] that he would like for her to be seen at [facility].  I emailed Bary Castilla and Abercrombie at [facility] to make them both aware.

## 2014-04-14 ENCOUNTER — Telehealth (HOSPITAL_COMMUNITY): Payer: Self-pay | Admitting: *Deleted

## 2014-05-10 ENCOUNTER — Ambulatory Visit (HOSPITAL_COMMUNITY): Payer: Self-pay | Admitting: Psychiatry

## 2014-05-12 ENCOUNTER — Encounter (HOSPITAL_COMMUNITY): Payer: Self-pay | Admitting: Psychiatry

## 2014-05-12 ENCOUNTER — Ambulatory Visit (INDEPENDENT_AMBULATORY_CARE_PROVIDER_SITE_OTHER): Payer: Medicare HMO | Admitting: Psychiatry

## 2014-05-12 VITALS — BP 100/69 | HR 85 | Ht 60.0 in | Wt 160.8 lb

## 2014-05-12 DIAGNOSIS — F411 Generalized anxiety disorder: Secondary | ICD-10-CM

## 2014-05-12 DIAGNOSIS — F332 Major depressive disorder, recurrent severe without psychotic features: Secondary | ICD-10-CM

## 2014-05-12 DIAGNOSIS — F331 Major depressive disorder, recurrent, moderate: Secondary | ICD-10-CM

## 2014-05-12 MED ORDER — OLANZAPINE 5 MG PO TABS: 5.0000 mg | ORAL_TABLET | Freq: Every day | ORAL | Status: DC

## 2014-05-12 MED ORDER — LAMOTRIGINE 100 MG PO TABS: 100.0000 mg | ORAL_TABLET | Freq: Every day | ORAL | Status: DC

## 2014-05-12 MED ORDER — DULOXETINE HCL 60 MG PO CPEP: 60.0000 mg | ORAL_CAPSULE | Freq: Two times a day (BID) | ORAL | Status: DC

## 2014-05-12 MED ORDER — ALPRAZOLAM 0.5 MG PO TABS: 0.5000 mg | ORAL_TABLET | Freq: Three times a day (TID) | ORAL | Status: DC | PRN

## 2014-05-12 NOTE — Progress Notes (Signed)
Patient ID: Caitlin [name], female   DOB: 06/20/1951, 63 y.o.   MRN: [id number] Patient ID: Caitlin [name], female   DOB: 06-12-1951, 63 y.o.   MRN: [id number] Patient ID: Caitlin [name], female   DOB: Jul 25, 1950, 63 y.o.   MRN: [id number] Patient ID: Caitlin [name], female   DOB: 03-22-1951, 63 y.o.   MRN: [id number] Patient ID: Caitlin [name], female   DOB: April 26, 1951, 63 y.o.   MRN: [id number] Patient ID: Caitlin [name], female   DOB: 1951/04/15, 63 y.o.   MRN: [id number] Patient ID: Caitlin [name], female   DOB: [birth year], 63 y.o.   MRN: [id number]  Psychiatric Assessment Adult  Patient Identification:  Caitlin [name] Date of Evaluation:  05/12/2014 Chief Complaint: I'm doing better"  History of Chief Complaint:  The patient has a history of depression and anxiety dating back to the late 90s  Chief Complaint  Patient presents with  . Depression  . Anxiety  . Follow-up    Anxiety Patient reports no nervous/anxious behavior.     this patient is a 63 year old married white female who lives with her husband in [place]. She has 4 children and 6 grandchildren. She worked in a [employer] until they let her go in 2005. She is self-referred.  The patient states that her depression started in the late 90s. She had to have an emergency colostomy due to diverticulosis. She had complications from the surgery and had to have a hernia repair and several other surgeries on her abdomen. She is left with a huge scar which lowered her self-confidence and self-esteem. She also suffers from migraine headaches.  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 Since then she's really gone downhill. She's become increasingly depressed and anxious. She's been seen at [facility] since 2009 but feels like the most recent psychiatrist does not listen to her and changed all the medications that were helpful. Currently she has lots of headaches, she's tired all the time and doesn't leave the house. She feels like she  is a burden to everyone around her and has passive suicidal ideation but no plan. She sleeps okay her appetite has diminished.  In 2011 she was admitted to [facility] because she was depressed and psychotic. She thinks this was a reaction to a medication that she got from migraine headache. She doesn't recall the name of the medicine. She was not hospitalized since then has had little therapy  Patient returns after  3 months. She had a mastectomy of her left breast in August. She is doing better now in terms of recovery and she did not need chemotherapy or radiation. She's going to need gel implant put in. Her mood is improved and her energy is slowly starting to come back. She still falls asleep in front of the TV during the day but she sleeps well at night. She is laughing a lot today and fairly upbeat Review of Systems  Constitutional: Negative for fatigue.  Neurological: Negative for headaches.  Psychiatric/Behavioral: The patient is not nervous/anxious.    chronic migraine headaches Physical Exam not done  Depressive Symptoms: depressed mood, anhedonia, psychomotor retardation, fatigue, feelings of worthlessness/guilt, difficulty concentrating, hopelessness, impaired memory, suicidal thoughts without plan, anxiety, loss of energy/fatigue, weight loss,  (Hypo) Manic Symptoms:   Elevated Mood:  No Irritable Mood:  No Grandiosity:  No Distractibility:  No Labiality of Mood:  No Delusions:  No Hallucinations:  No Impulsivity:  No Sexually Inappropriate Behavior:  No Financial Extravagance:  No Flight of Ideas:  No  Anxiety Symptoms: Excessive Worry:  yes Panic Symptoms:  Yes Agoraphobia:  Yes Obsessive Compulsive: Yes  Symptoms: None, Specific Phobias:  No Social Anxiety:  Yes  Psychotic Symptoms:  Hallucinations: No None Delusions:  No Paranoia:  No   Ideas of Reference:  No  PTSD Symptoms: Ever had a traumatic exposure:  No Had a traumatic exposure  in the last month:  No Re-experiencing: No None Hypervigilance:  No Hyperarousal: No None Avoidance: Yes Foreshortened Future  Traumatic Brain Injury: No   Past Psychiatric History: Diagnosis: Maj. 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 Hyperlipidemia     takes Atorvastatin daily  . Anxiety     takes Xanax daily as needed  . Depression     takes Cymbalta daily  . Hypertension     takes Losaratn daily  . Insomnia     takes Zyprexa nightly  . Pneumonia around 2005  . History of bronchitis around 2005  . Headache(784.0)     takes Topamax nightly;last migraine has been a while  . GERD (gastroesophageal reflux disease)     takes Pantoprazole daily as needed  . Diverticulitis   . History of kidney stones   . Anemia many yrs ago    not on any meds  . History of blood transfusion     no abnormal reaction noted  . Cataracts, bilateral     immature  . History of shingles    History of Loss of Consciousness:  No Seizure History:  No Cardiac History:  No Allergies:see below   Allergies  Allergen Reactions  . Codeine     Nausea/vomiting   Current Medications:  Current Outpatient Prescriptions  Medication Sig Dispense Refill  . ALPRAZolam (XANAX) 0.5 MG tablet Take 1 tablet (0.5 mg total) by mouth 3 (three) times daily as needed for anxiety. 90 tablet 2  . atorvastatin (LIPITOR) 10 MG tablet Take 10 mg by mouth every morning.     . butalbital-acetaminophen-caffeine (FIORICET, ESGIC) 50-325-40 MG per tablet Take 1 tablet by mouth 2 (two) times daily as needed for headache.     . docusate sodium 100 MG CAPS Take 100 mg by mouth daily.  (Patient taking differently: Take 100 mg by mouth daily as needed. ) 10 capsule 0  . DULoxetine (CYMBALTA) 60 MG capsule Take 1 capsule (60 mg total) by mouth 2 (two) times daily. 60 capsule 2  . lamoTRIgine (LAMICTAL) 100 MG tablet Take 1 tablet (100 mg total) by mouth at bedtime. 30 tablet 2  . losartan (COZAAR) 100 MG tablet Take 100 mg by mouth every morning.     . methocarbamol (ROBAXIN) 500 MG tablet Take 1 tablet (500 mg total) by mouth every 6 (six) hours as needed for muscle spasms. 40 tablet 1  . OLANZapine (ZYPREXA) 5 MG tablet Take 1 tablet (5 mg total) by mouth at bedtime. 30 tablet 2  . pantoprazole (PROTONIX) 40 MG tablet Take 40 mg by mouth daily as needed (for acid reflux).    . topiramate (TOPAMAX) 100 MG tablet Take 100 mg by  mouth at bedtime.    . enoxaparin (LOVENOX) 40 MG/0.4ML injection Inject 0.4 mLs (40 mg total) into the skin daily. 12 Syringe 0   No current facility-administered medications for this visit.    Previous Psychotropic Medications:  Medication Dose  Clonazepam 0.5 mg bid  Cymbalta  60 mg qhs  Lamictal 100 mg qhs               Substance Abuse History in the last 12 months:none                                                                                                   Medical Consequences of Substance Abuse:n/a  Legal Consequences of Substance Abuse: n/a  Family Consequences of Substance Abuse: n/a  Blackouts:  No DT's:  No Withdrawal Symptoms:  No None  Social History: Current Place of Residence: [place], [place] Family Members: Husband 4 children 6 grandchildren Marital Status:  Married Children: see above  Sons: See above  Daughters: See above Relationships: See above Education:  HS Graduate Educational Problems/Performance: Religious Beliefs/Practices: Christian History of Abuse: none Pensions consultant; former Solicitor History:  None. Legal  History:none Hobbies/Interests: Used to love ceramics  Family History:   Family History  Problem Relation Age of Onset  . Depression Mother   . Alcohol abuse Father     Mental Status Examination/Evaluation: Objective:  Appearance: Well Groomed    Engineer, water::  Fair  Speech: Clear   Volume:  Normal   Mood: Good   Affect bright   Thought Process:  Coherent  Orientation:  Full (Time, Place, and Person)  Thought Content:  Negative  Suicidal Thoughts:  no  Homicidal Thoughts:  No  Judgement:  Good   Insight:  Good   Psychomotor Activity:  Decreased  Akathisia:  No  Handed:  Right  AIMS (if indicated):  n/a  Assets:  Communication Skills Desire for Improvement Intimacy Social Support Talents/Skills    Laboratory/X-Ray Psychological Evaluation(s)  We'll have these sent from her primary Dr.   none   Assessment: AXIS I Maj. depression, recurrent severe, generalized anxiety disorder  AXIS II Deferred  AXIS III Past Medical History  Diagnosis Date  . Hyperlipidemia     takes Atorvastatin daily  . Anxiety     takes Xanax daily as needed  . Depression     takes Cymbalta daily  . Hypertension     takes Losaratn daily  . Insomnia     takes Zyprexa nightly  . Pneumonia around 2005  . History of bronchitis around 2005  . Headache(784.0)     takes Topamax nightly;last migraine has been a while  . GERD (gastroesophageal reflux disease)     takes Pantoprazole daily as needed  . Diverticulitis   . History of kidney stones   . Anemia many yrs ago    not on any meds  . History of blood transfusion     no abnormal reaction noted  . Cataracts, bilateral     immature  . History of shingles    migraine headache, history  of colon resection and hernia repair, history of right kidney tumor removal   AXIS IV minimal  AXIS V 41-50 serious symptoms   Treatment Plan/Recommendations:  Plan of Care: Medications and psychotherapy   Laboratory:  We will have these sent over from  her primary physician   Psychotherapy: She tells me she really doesn't feel comfortable doing the counseling   Medications: She'll continue her current medications.    Routine PRN Medications:  No  Consultations:none  Safety Concerns: She and her husband have been instructed to call if her depression or suicidal ideation worsens. She'll return to see me in 3 months   Other: none     [name], MD 11/20/20153:42 PM

## 2014-08-11 ENCOUNTER — Encounter (HOSPITAL_COMMUNITY): Payer: Self-pay | Admitting: Psychiatry

## 2014-08-11 ENCOUNTER — Ambulatory Visit (INDEPENDENT_AMBULATORY_CARE_PROVIDER_SITE_OTHER): Payer: Medicare HMO | Admitting: Psychiatry

## 2014-08-11 VITALS — BP 94/40 | HR 68 | Ht 60.0 in | Wt 154.2 lb

## 2014-08-11 DIAGNOSIS — F331 Major depressive disorder, recurrent, moderate: Secondary | ICD-10-CM

## 2014-08-11 DIAGNOSIS — F411 Generalized anxiety disorder: Secondary | ICD-10-CM

## 2014-08-11 MED ORDER — OLANZAPINE 5 MG PO TABS: 5.0000 mg | ORAL_TABLET | Freq: Every day | ORAL | Status: DC

## 2014-08-11 MED ORDER — ALPRAZOLAM 0.5 MG PO TABS: 0.5000 mg | ORAL_TABLET | Freq: Three times a day (TID) | ORAL | Status: DC | PRN

## 2014-08-11 MED ORDER — DULOXETINE HCL 60 MG PO CPEP: 60.0000 mg | ORAL_CAPSULE | Freq: Two times a day (BID) | ORAL | Status: DC

## 2014-08-11 MED ORDER — LAMOTRIGINE 100 MG PO TABS: 100.0000 mg | ORAL_TABLET | Freq: Every day | ORAL | Status: DC

## 2014-08-11 NOTE — Progress Notes (Signed)
Patient ID: Caitlin [name], female   DOB: 06-09-1951, 65 y.o.   MRN: [id number] Patient ID: Caitlin [name], female   DOB: April 21, 1951, 64 y.o.   MRN: [id number] Patient ID: Caitlin [name], female   DOB: 15-Sep-1950, 64 y.o.   MRN: [id number] Patient ID: Caitlin [name], female   DOB: [birth year], 64 y.o.   MRN: [id number] Patient ID: Caitlin [name], female   DOB: 02/24/1951, 64 y.o.   MRN: [id number] Patient ID: Caitlin [name], female   DOB: [birth year], 64 y.o.   MRN: [id number] Patient ID: Caitlin [name], female   DOB: 1951-01-07, 64 y.o.   MRN: [id number] Patient ID: Caitlin [name], female   DOB: Nov 20, 1950, 64 y.o.   MRN: [id number]  Psychiatric Assessment Adult  Patient Identification:  Caitlin [name] Date of Evaluation:  08/11/2014 Chief Complaint: "I've been sick"  History of Chief Complaint:  The patient has a history of depression and anxiety dating back to the late 90s  Chief Complaint  Patient presents with  . Depression  . Anxiety  . Follow-up    Anxiety Patient reports no nervous/anxious behavior.     this patient is a 64 year old married white female who lives with her husband in [place]. She has 4 children and 6 grandchildren. She worked in a [employer] until they let her go in 2005. She is self-referred.  The patient states that her depression started in the late 90s. She had to have an emergency colostomy due to diverticulosis. She had complications from the surgery and had to have a hernia repair and several other surgeries on her abdomen. She is left with a huge scar which lowered her self-confidence and self-esteem. She also suffers from migraine headaches.  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 Since then she's really gone downhill. She's become increasingly depressed and anxious. She's been seen at [facility] since 2009 but feels like the most recent psychiatrist does not listen to her and changed all the medications that were helpful. Currently she has lots of headaches,  she's tired all the time and doesn't leave the house. She feels like she is a burden to everyone around her and has passive suicidal ideation but no plan. She sleeps okay her appetite has diminished.  In 2011 she was admitted to [facility] because she was depressed and psychotic. She thinks this was a reaction to a medication that she got from migraine headache. She doesn't recall the name of the medicine. She was not hospitalized since then has had little therapy  Patient returns after  3 months. She had a mastectomy of her left breast in August. She had reconstruction of surgery in December and this is healing well. Unfortunately she has a pretty bad upper respiratory infection right now and is having pharyngitis and can't talk much. She's lost her appetite with this and has lost about 6 pounds. Her mood is been fairly stable. It's difficult to judge when she is so sick all the time. She's hoping that she'll recover from her illnesses and start to feel better. She is sleeping well and sometimes sleeps too much. She does think that the medications have been helpful Review of Systems  Constitutional: Negative for fatigue.  Neurological: Negative for headaches.  Psychiatric/Behavioral: The patient is not nervous/anxious.    chronic migraine headaches Physical Exam not done  Depressive Symptoms: depressed mood, anhedonia, psychomotor retardation, fatigue, feelings of worthlessness/guilt, difficulty concentrating, hopelessness, impaired memory,  suicidal thoughts without plan, anxiety, loss of energy/fatigue, weight loss,  (Hypo) Manic Symptoms:   Elevated Mood:  No Irritable Mood:  No Grandiosity:  No Distractibility:  No Labiality of Mood:  No Delusions:  No Hallucinations:  No Impulsivity:  No Sexually Inappropriate Behavior:  No Financial Extravagance:  No Flight of Ideas:  No  Anxiety Symptoms: Excessive Worry:  yes Panic Symptoms:  Yes Agoraphobia:   Yes Obsessive Compulsive: Yes  Symptoms: None, Specific Phobias:  No Social Anxiety:  Yes  Psychotic Symptoms:  Hallucinations: No None Delusions:  No Paranoia:  No   Ideas of Reference:  No  PTSD Symptoms: Ever had a traumatic exposure:  No Had a traumatic exposure in the last month:  No Re-experiencing: No None Hypervigilance:  No Hyperarousal: No None Avoidance: Yes Foreshortened Future  Traumatic Brain Injury: No   Past Psychiatric History: Diagnosis: Maj. 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 Hyperlipidemia     takes Atorvastatin daily  . Anxiety     takes Xanax daily as needed  . Depression     takes Cymbalta daily  . Hypertension     takes Losaratn daily  . Insomnia     takes Zyprexa nightly  . Pneumonia around 2005  . History of bronchitis around 2005  . Headache(784.0)     takes Topamax nightly;last migraine has been a while  . GERD (gastroesophageal reflux disease)     takes Pantoprazole daily as needed  . Diverticulitis   . History of kidney stones   . Anemia many yrs ago    not on any meds  . History of blood transfusion     no abnormal reaction noted  . Cataracts, bilateral     immature  . History of shingles    History of Loss of Consciousness:  No Seizure History:  No Cardiac History:  No Allergies:see below   Allergies  Allergen Reactions  . Codeine     Nausea/vomiting   Current Medications:  Current Outpatient Prescriptions  Medication Sig Dispense Refill  . ALPRAZolam (XANAX) 0.5 MG tablet Take 1 tablet (0.5 mg total) by mouth 3 (three) times daily as needed for anxiety. 90 tablet 2  .  atorvastatin (LIPITOR) 10 MG tablet Take 10 mg by mouth every morning.     . butalbital-acetaminophen-caffeine (FIORICET, ESGIC) 50-325-40 MG per tablet Take 1 tablet by mouth 2 (two) times daily as needed for headache.     . docusate sodium 100 MG CAPS Take 100 mg by mouth daily. (Patient taking differently: Take 100 mg by mouth daily as needed. ) 10 capsule 0  . DULoxetine (CYMBALTA) 60 MG capsule Take 1 capsule (60 mg total) by mouth 2 (two) times daily. 60 capsule 2  . lamoTRIgine (LAMICTAL) 100 MG tablet Take 1 tablet (100 mg total) by mouth at bedtime. 30 tablet 2  . losartan (COZAAR) 100 MG tablet Take 100 mg by mouth every morning.     . methocarbamol (ROBAXIN) 500 MG tablet Take 1 tablet (500 mg total) by mouth every 6 (six) hours as needed for muscle spasms. 40 tablet 1  . OLANZapine (ZYPREXA) 5 MG tablet Take  1 tablet (5 mg total) by mouth at bedtime. 30 tablet 2  . pantoprazole (PROTONIX) 40 MG tablet Take 40 mg by mouth daily as needed (for acid reflux).    . topiramate (TOPAMAX) 50 MG tablet Take 50 mg by mouth daily.    Marland Kitchen topiramate (TOPAMAX) 100 MG tablet     . vitamin B-12 (CYANOCOBALAMIN) 1000 MCG tablet      No current facility-administered medications for this visit.    Previous Psychotropic Medications:  Medication Dose  Clonazepam 0.5 mg bid  Cymbalta  60 mg qhs  Lamictal 100 mg qhs               Substance Abuse History in the last 12 months:none                                                                                                   Medical Consequences of Substance Abuse:n/a  Legal Consequences of Substance Abuse: n/a  Family Consequences of Substance Abuse: n/a  Blackouts:  No DT's:  No Withdrawal Symptoms:  No None  Social History: Current Place of Residence: [place], [place] Family Members: Husband 4 children 6 grandchildren Marital Status:  Married Children: see above  Sons: See above  Daughters: See  above Relationships: See above Education:  HS Graduate Educational Problems/Performance: Religious Beliefs/Practices: Christian History of Abuse: none Pensions consultant; former Solicitor History:  None. Legal History:none Hobbies/Interests: Used to love ceramics  Family History:   Family History  Problem Relation Age of Onset  . Depression Mother   . Alcohol abuse Father     Mental Status Examination/Evaluation: Objective:  Appearance: Well Groomed appears tired   Engineer, water::  Fair  Speech: Clear but hoarse today from her pharyngitis   Volume:  Normal   Mood: Bit low because she doesn't feel well   Affect constricted   Thought Process:  Coherent  Orientation:  Full (Time, Place, and Person)  Thought Content:  Negative  Suicidal Thoughts:  no  Homicidal Thoughts:  No  Judgement:  Good   Insight:  Good   Psychomotor Activity:  Decreased  Akathisia:  No  Handed:  Right  AIMS (if indicated):  n/a  Assets:  Communication Skills Desire for Improvement Intimacy Social Support Talents/Skills    Laboratory/X-Ray Psychological Evaluation(s)  We'll have these sent from her primary Dr.   none   Assessment: AXIS I Maj. depression, recurrent severe, generalized anxiety disorder  AXIS II Deferred  AXIS III Past Medical History  Diagnosis Date  . Hyperlipidemia     takes Atorvastatin daily  . Anxiety     takes Xanax daily as needed  . Depression     takes Cymbalta daily  . Hypertension     takes Losaratn daily  . Insomnia     takes Zyprexa nightly  . Pneumonia around 2005  . History of bronchitis around 2005  . Headache(784.0)     takes Topamax nightly;last migraine has been a while  . GERD (gastroesophageal reflux disease)     takes Pantoprazole daily as needed  .  Diverticulitis   . History of kidney stones   . Anemia many yrs ago    not on any meds  . History of blood transfusion     no abnormal reaction noted  . Cataracts, bilateral      immature  . History of shingles    migraine headache, history of colon resection and hernia repair, history of right kidney tumor removal   AXIS IV minimal  AXIS V 41-50 serious symptoms   Treatment Plan/Recommendations:  Plan of Care: Medications and psychotherapy   Laboratory:  We will have these sent over from her primary physician   Psychotherapy: She tells me she really doesn't feel comfortable doing the counseling   Medications: She'll continue her current medications.    Routine PRN Medications:  No  Consultations:none  Safety Concerns: She and her husband have been instructed to call if her depression or suicidal ideation worsens. She'll return to see me in 3 months   Other: none     [name], MD 2/19/20161:38 PM

## 2014-08-14 ENCOUNTER — Ambulatory Visit (HOSPITAL_COMMUNITY): Payer: Self-pay | Admitting: Psychiatry

## 2014-10-11 ENCOUNTER — Other Ambulatory Visit (HOSPITAL_COMMUNITY): Payer: Self-pay | Admitting: Oncology

## 2014-10-11 DIAGNOSIS — F419 Anxiety disorder, unspecified: Secondary | ICD-10-CM

## 2014-10-11 DIAGNOSIS — Z8719 Personal history of other diseases of the digestive system: Secondary | ICD-10-CM | POA: Insufficient documentation

## 2014-10-11 DIAGNOSIS — F32A Depression, unspecified: Secondary | ICD-10-CM | POA: Insufficient documentation

## 2014-10-11 DIAGNOSIS — F17210 Nicotine dependence, cigarettes, uncomplicated: Secondary | ICD-10-CM | POA: Insufficient documentation

## 2014-10-11 DIAGNOSIS — Z9889 Other specified postprocedural states: Secondary | ICD-10-CM

## 2014-10-11 DIAGNOSIS — F329 Major depressive disorder, single episode, unspecified: Secondary | ICD-10-CM | POA: Insufficient documentation

## 2014-10-11 DIAGNOSIS — K5720 Diverticulitis of large intestine with perforation and abscess without bleeding: Secondary | ICD-10-CM | POA: Insufficient documentation

## 2014-10-16 ENCOUNTER — Ambulatory Visit (HOSPITAL_COMMUNITY): Payer: Medicare HMO

## 2014-10-20 ENCOUNTER — Ambulatory Visit (HOSPITAL_COMMUNITY)
Admission: RE | Admit: 2014-10-20 | Discharge: 2014-10-20 | Disposition: A | Discharge status: Home / Self Care | Payer: Medicare HMO | Source: Ambulatory Visit | Attending: Oncology | Admitting: Oncology

## 2014-10-24 ENCOUNTER — Ambulatory Visit (HOSPITAL_COMMUNITY): Payer: Medicare HMO

## 2014-11-02 ENCOUNTER — Other Ambulatory Visit (HOSPITAL_COMMUNITY): Payer: Self-pay | Admitting: Oncology

## 2014-11-02 DIAGNOSIS — C50919 Malignant neoplasm of unspecified site of unspecified female breast: Secondary | ICD-10-CM

## 2014-11-02 DIAGNOSIS — F17200 Nicotine dependence, unspecified, uncomplicated: Secondary | ICD-10-CM

## 2014-11-06 ENCOUNTER — Other Ambulatory Visit (HOSPITAL_COMMUNITY): Payer: Self-pay | Admitting: Psychiatry

## 2014-11-08 ENCOUNTER — Ambulatory Visit (HOSPITAL_COMMUNITY)
Admission: RE | Admit: 2014-11-08 | Discharge: 2014-11-08 | Disposition: A | Discharge status: Home / Self Care | Payer: Medicare HMO | Source: Ambulatory Visit | Attending: Oncology | Admitting: Oncology

## 2014-11-08 DIAGNOSIS — R05 Cough: Secondary | ICD-10-CM | POA: Diagnosis not present

## 2014-11-08 DIAGNOSIS — R918 Other nonspecific abnormal finding of lung field: Secondary | ICD-10-CM | POA: Diagnosis not present

## 2014-11-08 DIAGNOSIS — C50919 Malignant neoplasm of unspecified site of unspecified female breast: Secondary | ICD-10-CM | POA: Diagnosis not present

## 2014-11-08 DIAGNOSIS — F17200 Nicotine dependence, unspecified, uncomplicated: Secondary | ICD-10-CM

## 2014-11-08 MED ORDER — SODIUM CHLORIDE 0.9 % IJ SOLN
INTRAMUSCULAR | Status: AC
  Filled 2014-11-08: qty 60

## 2014-11-08 MED ORDER — SODIUM CHLORIDE 0.9 % IJ SOLN
INTRAMUSCULAR | Status: AC
  Filled 2014-11-08: qty 1000

## 2014-11-09 ENCOUNTER — Ambulatory Visit (INDEPENDENT_AMBULATORY_CARE_PROVIDER_SITE_OTHER): Payer: Medicare HMO | Admitting: Psychiatry

## 2014-11-09 ENCOUNTER — Encounter (HOSPITAL_COMMUNITY): Payer: Self-pay | Admitting: Psychiatry

## 2014-11-09 VITALS — BP 96/66 | HR 82 | Ht 60.0 in | Wt 161.0 lb

## 2014-11-09 DIAGNOSIS — F331 Major depressive disorder, recurrent, moderate: Secondary | ICD-10-CM

## 2014-11-09 MED ORDER — OLANZAPINE 5 MG PO TABS: 5.0000 mg | ORAL_TABLET | Freq: Every day | ORAL | Status: DC

## 2014-11-09 MED ORDER — DULOXETINE HCL 60 MG PO CPEP: 60.0000 mg | ORAL_CAPSULE | Freq: Two times a day (BID) | ORAL | Status: DC

## 2014-11-09 MED ORDER — LAMOTRIGINE 100 MG PO TABS: 100.0000 mg | ORAL_TABLET | Freq: Every day | ORAL | Status: DC

## 2014-11-09 MED ORDER — ALPRAZOLAM 0.5 MG PO TABS: 0.5000 mg | ORAL_TABLET | Freq: Three times a day (TID) | ORAL | Status: DC | PRN

## 2014-11-10 NOTE — Psych (Signed)
[facility] MD/PA/NP OP Progress Note  Caitlin [name]  MRN:  [id number] 11/09/2014 Subjective:   this patient is a 64 year old married white female who lives with her husband in [place]. She has 4 children and 6 grandchildren. She worked in a [employer] until they let her go in 2005. She is self-referred.  The patient states that her depression started in the late 90s. She had to have an emergency colostomy due to diverticulosis. She had complications from the surgery and had to have a hernia repair and several other surgeries on her abdomen. She is left with a huge scar which lowered her self-confidence and self-esteem. She also suffers from migraine headaches.  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 Since then she's really gone downhill. She's become increasingly depressed and anxious. She's been seen at [facility] since 2009 but feels like the most recent psychiatrist does not listen to her and changed all the medications that were helpful. Currently she has lots of headaches, she's tired all the time and doesn't leave the house. She feels like she is a burden to everyone around her and has passive suicidal ideation but no plan. She sleeps okay her appetite has diminished.  In 2011 she was admitted to [facility] because she was depressed and psychotic. She thinks this was a reaction to a medication that she got from migraine headache. She doesn't recall the name of the medicine. She was not hospitalized since then has had little therapy  Patient returns after 3 months. She is finally healed up from the mastectomy and reconstructive surgery. She had several upper respiratory infections but this is better as well. Her mood is stable and she is getting out more. She denies serious symptoms of depression and she is sleeping well. Her energy is starting to improve again Chief Complaint:  Chief Complaint    Depression     Visit Diagnosis:     ICD-9-CM ICD-10-CM   1.  Major depressive disorder, recurrent episode, moderate 296.32 F33.1     Past Medical History:  Past Medical History  Diagnosis Date  . Hyperlipidemia     takes Atorvastatin daily  . Anxiety     takes Xanax daily as needed  . Depression     takes Cymbalta daily  . Hypertension     takes Losaratn daily  . Insomnia     takes Zyprexa nightly  . Pneumonia around 2005  . History of bronchitis around 2005  . Headache(784.0)     takes Topamax nightly;last migraine has been a while  . GERD (gastroesophageal reflux disease)     takes Pantoprazole daily as needed  . Diverticulitis   . History of kidney stones   . Anemia many yrs ago    not on any meds  . History of blood transfusion     no abnormal reaction noted  . Cataracts, bilateral     immature  . History of shingles     Past Surgical History  Procedure Laterality Date  . Incisional hernia repair    . R kidney tumor removed    . Tonsillectomy    . Tubal ligation    . Colon surgery      d/t diverticulitis  . Colostomy take down    . Hernia repair  2014  . Esophagogastroduodenoscopy    . Simple mastectomy with axillary sentinel node biopsy Left 02/10/2014    Procedure: LEFT TOTAL MASTECTOMY WITH AXILLARY SENTINEL NODE BIOPSY;  Surgeon: Edward Jolly, MD;  Location: [facility];  Service: General;  Laterality: Left;  . Breast reconstruction with placement of tissue expander and flex hd (acellular hydrated dermis) Left 02/10/2014    Procedure: LEFT BREAST RECONSTRUCTION WITH PLACEMENT OF TISSUE EXPANDER AND  FLEX HD (ACELLULAR HYDRATED DERMIS);  Surgeon: Crissie Reese, MD;  Location: [facility];  Service: Plastics;  Laterality: Left;   Family History:  Family History  Problem Relation Age of Onset  . Depression Mother   . Alcohol abuse Father    Social History:  History   Social History  . Marital Status: Married    Spouse Name: N/A  . Number of Children: N/A  . Years of Education: N/A   Social History Main Topics  .  Smoking status: Former Research scientist (life sciences)  . Smokeless tobacco: Never Used     Comment: quit smoking almost 3 yrs ago  . Alcohol Use: No  . Drug Use: No  . Sexual Activity: Not Currently    Birth Control/ Protection: Post-menopausal   Other Topics Concern  . None   Social History Narrative   Additional History:   Assessment: Patient's mood is improved now that she has recovered from her mastectomy  Musculoskeletal: Strength & Muscle Tone: within normal limits Gait & Station: normal Patient leans: N/A  Psychiatric Specialty Exam: HPI  Review of Systems  Musculoskeletal: Positive for myalgias.  All other systems reviewed and are negative.   Blood pressure 96/66, pulse 82, height 5' (1.524 m), weight 73.029 kg (161 lb).Body mass index is 31.44 kg/(m^2).  General Appearance: Casual, Neat and Well Groomed  Eye Contact:  Good  Speech:  Slow  Volume:  Decreased  Mood:  Euthymic  Affect:  Congruent  Thought Process:  Coherent  Orientation:  Full (Time, Place, and Person)  Thought Content:  Rumination  Suicidal Thoughts:  No  Homicidal Thoughts:  No  Memory:  Immediate;   Good Recent;   Good Remote;   Good  Judgement:  Good  Insight:  Fair  Psychomotor Activity:  Decreased  Concentration:  Good  Recall:  Good  Fund of Knowledge: Good  Language: Good  Akathisia:  No  Handed:  Right  AIMS (if indicated):    Assets:  Communication Skills Desire for Improvement Resilience Social Support  ADL's:  Intact  Cognition: WNL  Sleep:  Good    Is the patient at risk to self?  No. Has the patient been a risk to self in the past 6 months?  No. Has the patient been a risk to self within the distant past?  No. Is the patient a risk to others?  No. Has the patient been a risk to others in the past 6 months?  No. Has the patient been a risk to others within the distant past?  No.  Current Medications: Current Outpatient Prescriptions  Medication Sig Dispense Refill  . ALPRAZolam  (XANAX) 0.5 MG tablet Take 1 tablet (0.5 mg total) by mouth 3 (three) times daily as needed for anxiety. 90 tablet 2  . atorvastatin (LIPITOR) 10 MG tablet Take 10 mg by mouth every morning.     . butalbital-acetaminophen-caffeine (FIORICET, ESGIC) 50-325-40 MG per tablet Take 1 tablet by mouth 2 (two) times daily as needed for headache.     . docusate sodium 100 MG CAPS Take 100 mg by mouth daily. (Patient taking differently: Take 100 mg by mouth daily as needed. ) 10 capsule 0  . DULoxetine (CYMBALTA) 60 MG capsule Take 1  capsule (60 mg total) by mouth 2 (two) times daily. 60 capsule 2  . lamoTRIgine (LAMICTAL) 100 MG tablet Take 1 tablet (100 mg total) by mouth at bedtime. 30 tablet 2  . losartan (COZAAR) 100 MG tablet Take 100 mg by mouth every morning.     . methocarbamol (ROBAXIN) 500 MG tablet Take 1 tablet (500 mg total) by mouth every 6 (six) hours as needed for muscle spasms. 40 tablet 1  . OLANZapine (ZYPREXA) 5 MG tablet Take 1 tablet (5 mg total) by mouth at bedtime. 30 tablet 2  . pantoprazole (PROTONIX) 40 MG tablet Take 40 mg by mouth daily.     Marland Kitchen topiramate (TOPAMAX) 100 MG tablet Take 100 mg by mouth daily.     Marland Kitchen topiramate (TOPAMAX) 50 MG tablet Take 50 mg by mouth daily.    . vitamin B-12 (CYANOCOBALAMIN) 1000 MCG tablet Take 1,000 mcg by mouth daily.     Marland Kitchen exemestane (AROMASIN) 25 MG tablet      No current facility-administered medications for this visit.    Medical Decision Making:  Established Problem, Stable/Improving (1), Review of Psycho-Social Stressors (1), Review and summation of old records (2) and Review of Medication Regimen & Side Effects (2)  Treatment Plan Summary:Medication management the patient will continue olanzapine for mood stabilization as well as Lamictal. She will continue Cymbalta for depression and Ativan for anxiety. She'll return to see me in 3 months or call if symptoms emerge sooner   ROSS, [facility] 11/10/2014, 11:38 AM

## 2014-11-22 DIAGNOSIS — J439 Emphysema, unspecified: Secondary | ICD-10-CM | POA: Insufficient documentation

## 2014-11-22 DIAGNOSIS — D890 Polyclonal hypergammaglobulinemia: Secondary | ICD-10-CM | POA: Insufficient documentation

## 2014-11-22 DIAGNOSIS — N281 Cyst of kidney, acquired: Secondary | ICD-10-CM | POA: Insufficient documentation

## 2014-11-22 DIAGNOSIS — D649 Anemia, unspecified: Secondary | ICD-10-CM | POA: Insufficient documentation

## 2014-11-23 ENCOUNTER — Other Ambulatory Visit (HOSPITAL_COMMUNITY): Payer: Self-pay | Admitting: Oncology

## 2014-11-23 DIAGNOSIS — Z1231 Encounter for screening mammogram for malignant neoplasm of breast: Secondary | ICD-10-CM

## 2014-12-08 ENCOUNTER — Ambulatory Visit (HOSPITAL_COMMUNITY)
Admission: RE | Admit: 2014-12-08 | Discharge: 2014-12-08 | Disposition: A | Discharge status: Home / Self Care | Payer: Medicare HMO | Source: Ambulatory Visit | Attending: Oncology | Admitting: Oncology

## 2014-12-08 DIAGNOSIS — Z1231 Encounter for screening mammogram for malignant neoplasm of breast: Secondary | ICD-10-CM | POA: Diagnosis not present

## 2015-02-09 ENCOUNTER — Ambulatory Visit (HOSPITAL_COMMUNITY): Payer: Self-pay | Admitting: Psychiatry

## 2015-02-12 ENCOUNTER — Other Ambulatory Visit (HOSPITAL_COMMUNITY): Payer: Self-pay | Admitting: Psychiatry

## 2015-02-14 ENCOUNTER — Telehealth (HOSPITAL_COMMUNITY): Payer: Self-pay | Admitting: *Deleted

## 2015-02-14 MED ORDER — LAMOTRIGINE 100 MG PO TABS: 100.0000 mg | ORAL_TABLET | Freq: Every day | ORAL | Status: DC

## 2015-02-14 NOTE — Telephone Encounter (Signed)
Per pt husband, pt do not have any refills for her Lamictal. Called [facility] requesting for medication to be refilled for pt until f/u appt and per Shanw to give pt one months worth Lamictal. Pt husband is aware and shows understanding

## 2015-02-14 NOTE — Telephone Encounter (Signed)
Medication refilled

## 2015-02-14 NOTE — Telephone Encounter (Signed)
pharmacy called regarding request for Lamictal.

## 2015-02-19 ENCOUNTER — Encounter (HOSPITAL_COMMUNITY): Payer: Self-pay | Admitting: Psychiatry

## 2015-02-19 ENCOUNTER — Ambulatory Visit (HOSPITAL_COMMUNITY): Payer: Self-pay | Admitting: Psychiatry

## 2015-02-19 ENCOUNTER — Ambulatory Visit (INDEPENDENT_AMBULATORY_CARE_PROVIDER_SITE_OTHER): Payer: Medicare HMO | Admitting: Psychiatry

## 2015-02-19 VITALS — BP 104/81 | HR 85 | Ht 60.0 in | Wt 164.0 lb

## 2015-02-19 DIAGNOSIS — F411 Generalized anxiety disorder: Secondary | ICD-10-CM | POA: Diagnosis not present

## 2015-02-19 DIAGNOSIS — F331 Major depressive disorder, recurrent, moderate: Secondary | ICD-10-CM | POA: Diagnosis not present

## 2015-02-19 MED ORDER — ALPRAZOLAM 0.5 MG PO TABS: 0.5000 mg | ORAL_TABLET | Freq: Three times a day (TID) | ORAL | Status: DC | PRN

## 2015-02-19 MED ORDER — DULOXETINE HCL 60 MG PO CPEP: 60.0000 mg | ORAL_CAPSULE | Freq: Two times a day (BID) | ORAL | Status: DC

## 2015-02-19 MED ORDER — OLANZAPINE 5 MG PO TABS: 5.0000 mg | ORAL_TABLET | Freq: Every day | ORAL | Status: DC

## 2015-02-19 MED ORDER — LAMOTRIGINE 100 MG PO TABS: 100.0000 mg | ORAL_TABLET | Freq: Every day | ORAL | Status: DC

## 2015-02-19 NOTE — Progress Notes (Signed)
Patient ID: Caitlin [name], female   DOB: 17-Jan-1951, 64 y.o.   MRN: [id number] Patient ID: Caitlin [name], female   DOB: [birth year], 64 y.o.   MRN: [id number] Patient ID: Caitlin [name], female   DOB: [birth year], 64 y.o.   MRN: [id number] Patient ID: Caitlin [name], female   DOB: 1951-01-17, 64 y.o.   MRN: [id number] Patient ID: Caitlin [name], female   DOB: 1950/11/22, 64 y.o.   MRN: [id number] Patient ID: Caitlin [name], female   DOB: [birth year], 64 y.o.   MRN: [id number] Patient ID: Caitlin [name], female   DOB: 1950-08-03, 64 y.o.   MRN: [id number] Patient ID: Caitlin [name], female   DOB: [birth year], 64 y.o.   MRN: [id number] Patient ID: Caitlin [name], female   DOB: 1951-03-20, 64 y.o.   MRN: [id number]  Psychiatric Assessment Adult  Patient Identification:  Caitlin [name] Date of Evaluation:  02/19/2015 Chief Complaint: "Nothing interests me"  History of Chief Complaint:  The patient has a history of depression and anxiety dating back to the late 90s  Chief Complaint  Patient presents with  . Depression  . Anxiety  . Follow-up    Depression        Associated symptoms include no fatigue and no headaches.  Past medical history includes anxiety.   Anxiety Patient reports no nervous/anxious behavior.     this patient is a 64 year old married white female who lives with her husband in [place]. She has 4 children and 6 grandchildren. She worked in a [employer] until they let her go in 2005. She is self-referred.  The patient states that her depression started in the late 90s. She had to have an emergency colostomy due to diverticulosis. She had complications from the surgery and had to have a hernia repair and several other surgeries on her abdomen. She is left with a huge scar which lowered her self-confidence and self-esteem. She also suffers from migraine headaches.  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 Since then she's really gone downhill. She's become increasingly depressed and  anxious. She's been seen at [facility] since 2009 but feels like the most recent psychiatrist does not listen to her and changed all the medications that were helpful. Currently she has lots of headaches, she's tired all the time and doesn't leave the house. She feels like she is a burden to everyone around her and has passive suicidal ideation but no plan. She sleeps okay her appetite has diminished.  In 2011 she was admitted to [facility] because she was depressed and psychotic. She thinks this was a reaction to a medication that she got from migraine headache. She doesn't recall the name of the medicine. She was not hospitalized since then has had little therapy  Patient returns after  3 months. 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 Her blood pressures running a little bit on the low side. She feels like she does not much energy to do anything. She used to Psychologist, occupational and doing other crafts and she still has a lot of these materials around her house which she just doesn't feel like doing it. We discussed numerous options [facility]. She denies being seriously depressed or suicidal and she is sleeping well. Several for medications are sedating and she is taking them all at night  Review of Systems  Constitutional: Negative for fatigue.  Neurological: Negative for headaches.  Psychiatric/Behavioral: Positive for depression. The patient is not nervous/anxious.    chronic migraine headaches Physical Exam not done  Depressive Symptoms: depressed mood, anhedonia, psychomotor retardation, fatigue, feelings of worthlessness/guilt, difficulty concentrating, hopelessness, impaired memory, suicidal thoughts without plan, anxiety, loss of energy/fatigue, weight loss,  (Hypo) Manic Symptoms:   Elevated Mood:  No Irritable Mood:  No Grandiosity:  No Distractibility:  No Labiality  of Mood:  No Delusions:  No Hallucinations:  No Impulsivity:  No Sexually Inappropriate Behavior:  No Financial Extravagance:  No Flight of Ideas:  No  Anxiety Symptoms: Excessive Worry:  yes Panic Symptoms:  Yes Agoraphobia:  Yes Obsessive Compulsive: Yes  Symptoms: None, Specific Phobias:  No Social Anxiety:  Yes  Psychotic Symptoms:  Hallucinations: No None Delusions:  No Paranoia:  No   Ideas of Reference:  No  PTSD Symptoms: Ever had a traumatic exposure:  No Had a traumatic exposure in the last month:  No Re-experiencing: No None Hypervigilance:  No Hyperarousal: No None Avoidance: Yes Foreshortened Future  Traumatic Brain Injury: No   Past Psychiatric History: Diagnosis: Maj. 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 Hyperlipidemia     takes Atorvastatin daily  . Anxiety     takes Xanax daily as needed  . Depression     takes Cymbalta daily  . Hypertension     takes Losaratn daily  . Insomnia     takes Zyprexa nightly  . Pneumonia around 2005  . History of bronchitis around 2005  . Headache(784.0)     takes Topamax nightly;last migraine has been a while  . GERD (gastroesophageal reflux disease)     takes Pantoprazole daily as needed  . Diverticulitis   . History of kidney stones   . Anemia many yrs ago    not on any meds  . History of blood transfusion     no abnormal reaction noted  . Cataracts, bilateral     immature  . History of shingles    History of Loss of Consciousness:  No Seizure History:  No Cardiac History:  No Allergies:see below   Allergies  Allergen Reactions  . Codeine     Nausea/vomiting    Current Medications:  Current Outpatient Prescriptions  Medication Sig Dispense Refill  . ALPRAZolam (XANAX) 0.5 MG tablet Take 1 tablet (0.5 mg total) by mouth 3 (three) times daily as needed for anxiety. 90 tablet 2  . atorvastatin (LIPITOR) 10 MG tablet Take 10 mg by mouth every morning.     . butalbital-acetaminophen-caffeine (FIORICET, ESGIC) 50-325-40 MG per tablet Take 1 tablet by mouth 2 (two) times daily as needed for headache.     . docusate sodium 100 MG CAPS Take 100 mg by mouth daily. (Patient taking differently: Take 100 mg by mouth daily as needed. ) 10 capsule 0  . DULoxetine (CYMBALTA) 60 MG capsule Take 1 capsule (60 mg total) by mouth 2 (two) times daily. 60 capsule 2  . lamoTRIgine (LAMICTAL) 100 MG tablet Take 1 tablet (100 mg total) by mouth at bedtime. 30 tablet 2  .  methocarbamol (ROBAXIN) 500 MG tablet Take 1 tablet (500 mg total) by mouth every 6 (six) hours as needed for muscle spasms. 40 tablet 1  . OLANZapine (ZYPREXA) 5 MG tablet Take 1 tablet (5 mg total) by mouth at bedtime. 30 tablet 2  . pantoprazole (PROTONIX) 40 MG tablet Take 40 mg by mouth daily.     Marland Kitchen topiramate (TOPAMAX) 100 MG tablet Take 100 mg by mouth daily.     Marland Kitchen topiramate (TOPAMAX) 50 MG tablet Take 50 mg by mouth daily.    . vitamin B-12 (CYANOCOBALAMIN) 1000 MCG tablet Take 1,000 mcg by mouth daily.     Marland Kitchen exemestane (AROMASIN) 25 MG tablet     . losartan (COZAAR) 100 MG tablet Take 100 mg by mouth every morning.      No current facility-administered medications for this visit.    Previous Psychotropic Medications:  Medication Dose  Clonazepam 0.5 mg bid  Cymbalta  60 mg qhs  Lamictal 100 mg qhs               Substance Abuse History in the last 12 months:none                                                                                                   Medical Consequences of Substance Abuse:n/a  Legal Consequences of Substance Abuse: n/a  Family  Consequences of Substance Abuse: n/a  Blackouts:  No DT's:  No Withdrawal Symptoms:  No None  Social History: Current Place of Residence: [place], [place] Family Members: Husband 4 children 6 grandchildren Marital Status:  Married Children: see above  Sons: See above  Daughters: See above Relationships: See above Education:  HS Graduate Educational Problems/Performance: Religious Beliefs/Practices: Christian History of Abuse: none Pensions consultant; former Solicitor History:  None. Legal History:none Hobbies/Interests: Used to love ceramics  Family History:   Family History  Problem Relation Age of Onset  . Depression Mother   . Alcohol abuse Father     Mental Status Examination/Evaluation: Objective:  Appearance: Well Groomed  Engineer, water::  Fair  Speech: Clear   Volume:  Normal   Mood: Okay, little low   Affect constricted   Thought Process:  Coherent  Orientation:  Full (Time, Place, and Person)  Thought Content:  Negative  Suicidal Thoughts:  no  Homicidal Thoughts:  No  Judgement:  Good   Insight:  Good   Psychomotor Activity:  Decreased  Akathisia:  No  Handed:  Right  AIMS (if indicated):  n/a  Assets:  Communication Skills Desire for Improvement Intimacy Social Support Talents/Skills    Laboratory/X-Ray Psychological Evaluation(s)  We'll have these sent from her primary Dr.   none   Assessment: AXIS I Maj. depression, recurrent severe, generalized anxiety disorder  AXIS II Deferred  AXIS III Past Medical History  Diagnosis Date  . Hyperlipidemia     takes Atorvastatin daily  . Anxiety     takes Xanax daily as needed  . Depression     takes Cymbalta daily  . Hypertension     takes [employer]  daily  . Insomnia     takes Zyprexa nightly  . Pneumonia around 2005  . History of bronchitis around 2005  . Headache(784.0)     takes Topamax nightly;last migraine has been a while  . GERD (gastroesophageal  reflux disease)     takes Pantoprazole daily as needed  . Diverticulitis   . History of kidney stones   . Anemia many yrs ago    not on any meds  . History of blood transfusion     no abnormal reaction noted  . Cataracts, bilateral     immature  . History of shingles    migraine headache, history of colon resection and hernia repair, history of right kidney tumor removal   AXIS IV minimal  AXIS V 41-50 serious symptoms   Treatment Plan/Recommendations:  Plan of Care: Medications and psychotherapy   Laboratory:  We will have these sent over from her primary physician   Psychotherapy: She tells me she really doesn't feel comfortable doing the counseling   Medications: She'll continue Cymbalta for depression, Zyprexa and Lamictal for mood stabilization and Xanax for anxiety. She states that she rarely uses the Xanax now   Routine PRN Medications:  No  Consultations:none  Safety Concerns: She and her husband have been instructed to call if her depression or suicidal ideation worsens. She'll return to see me in 3 months   Other: She was instructed to find some interests or hobbies that she will enjoy     [name], MD 8/29/20161:22 PM

## 2015-03-08 ENCOUNTER — Other Ambulatory Visit (HOSPITAL_COMMUNITY): Payer: Self-pay | Admitting: Psychiatry

## 2015-03-13 ENCOUNTER — Ambulatory Visit (HOSPITAL_COMMUNITY): Payer: Self-pay | Admitting: Psychiatry

## 2015-05-08 ENCOUNTER — Other Ambulatory Visit (HOSPITAL_COMMUNITY): Payer: Self-pay | Admitting: Psychiatry

## 2015-05-15 ENCOUNTER — Ambulatory Visit (INDEPENDENT_AMBULATORY_CARE_PROVIDER_SITE_OTHER): Payer: Medicare HMO | Admitting: Psychiatry

## 2015-05-15 ENCOUNTER — Encounter (HOSPITAL_COMMUNITY): Payer: Self-pay | Admitting: Psychiatry

## 2015-05-15 VITALS — BP 119/74 | HR 90 | Ht 60.0 in | Wt 165.4 lb

## 2015-05-15 DIAGNOSIS — F411 Generalized anxiety disorder: Secondary | ICD-10-CM

## 2015-05-15 DIAGNOSIS — F331 Major depressive disorder, recurrent, moderate: Secondary | ICD-10-CM

## 2015-05-15 MED ORDER — DULOXETINE HCL 60 MG PO CPEP: 60.0000 mg | ORAL_CAPSULE | Freq: Two times a day (BID) | ORAL | Status: DC

## 2015-05-15 MED ORDER — LAMOTRIGINE 100 MG PO TABS: 100.0000 mg | ORAL_TABLET | Freq: Every day | ORAL | Status: DC

## 2015-05-15 MED ORDER — OLANZAPINE 5 MG PO TABS: 5.0000 mg | ORAL_TABLET | Freq: Every day | ORAL | Status: DC

## 2015-05-15 MED ORDER — ALPRAZOLAM 0.5 MG PO TABS: 0.5000 mg | ORAL_TABLET | Freq: Three times a day (TID) | ORAL | Status: DC | PRN

## 2015-05-15 NOTE — Progress Notes (Signed)
Patient ID: Caitlin [name], female   DOB: [birth year], 64 y.o.   MRN: [id number] Patient ID: JOYCIE [name], female   DOB: 1950/08/04, 64 y.o.   MRN: [id number] Patient ID: Caitlin [name], female   DOB: 03-06-1951, 64 y.o.   MRN: [id number] Patient ID: Caitlin [name], female   DOB: 24-Jun-1950, 64 y.o.   MRN: [id number] Patient ID: Caitlin [name], female   DOB: 26-Jul-1950, 64 y.o.   MRN: [id number] Patient ID: Caitlin [name], female   DOB: 25-Sep-1950, 64 y.o.   MRN: [id number] Patient ID: Caitlin [name], female   DOB: 1951-03-29, 64 y.o.   MRN: [id number] Patient ID: Caitlin [name], female   DOB: 01/18/1951, 64 y.o.   MRN: [id number] Patient ID: Caitlin [name], female   DOB: 1950/10/24, 64 y.o.   MRN: [id number] Patient ID: Caitlin [name], female   DOB: [birth year], 64 y.o.   MRN: [id number]  Psychiatric Assessment Adult  Patient Identification:  Caitlin [name] Date of Evaluation:  05/15/2015 Chief Complaint: "Nothing interests me"  History of Chief Complaint:  The patient has a history of depression and anxiety dating back to the late 90s  Chief Complaint  Patient presents with  . Depression  . Anxiety  . Follow-up    Depression        Associated symptoms include no fatigue and no headaches.  Past medical history includes anxiety.   Anxiety Patient reports no nervous/anxious behavior.     this patient is a 64 year old married white female who lives with her husband in [place]. She has 4 children and 6 grandchildren. She worked in a [employer] until they let her go in 2005. She is self-referred.  The patient states that her depression started in the late 90s. She had to have an emergency colostomy due to diverticulosis. She had complications from the surgery and had to have a hernia repair and several other surgeries on her abdomen. She is left with a huge scar which lowered her self-confidence and self-esteem. She also suffers from migraine headaches.  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  Since then she's really gone downhill. She's become increasingly depressed and anxious. She's been seen at [facility] since 2009 but feels like the most recent psychiatrist does not listen to her and changed all the medications that were helpful. Currently she has lots of headaches, she's tired all the time and doesn't leave the house. She feels like she is a burden to everyone around her and has passive suicidal ideation but no plan. She sleeps okay her appetite has diminished.  In 2011 she was admitted to [facility] because she was depressed and psychotic. She thinks this was a reaction to a medication that she got from migraine headache. She doesn't recall the name of the medicine. She was not hospitalized since then has had little therapy  Patient returns after  3 months. 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 Her energy is finally starting to come back. Her mood is improved and she is looking forward to Christmas shopping. She wakes up early due to habit but then take several naps during the day. She doesn't really want to change any medications because she feels are very helpful Review of Systems  Constitutional: Negative for fatigue.  Neurological: Negative for headaches.  Psychiatric/Behavioral: Positive for depression. The patient is not nervous/anxious.  chronic migraine headaches Physical Exam not done  Depressive Symptoms: depressed mood, anhedonia, psychomotor retardation, fatigue, feelings of worthlessness/guilt, difficulty concentrating, hopelessness, impaired memory, suicidal thoughts without plan, anxiety, loss of energy/fatigue, weight loss,  (Hypo) Manic Symptoms:   Elevated Mood:  No Irritable Mood:  No Grandiosity:  No Distractibility:  No Labiality of Mood:  No Delusions:  No Hallucinations:  No Impulsivity:  No Sexually Inappropriate Behavior:  No Financial Extravagance:  No Flight  of Ideas:  No  Anxiety Symptoms: Excessive Worry:  yes Panic Symptoms:  Yes Agoraphobia:  Yes Obsessive Compulsive: Yes  Symptoms: None, Specific Phobias:  No Social Anxiety:  Yes  Psychotic Symptoms:  Hallucinations: No None Delusions:  No Paranoia:  No   Ideas of Reference:  No  PTSD Symptoms: Ever had a traumatic exposure:  No Had a traumatic exposure in the last month:  No Re-experiencing: No None Hypervigilance:  No Hyperarousal: No None Avoidance: Yes Foreshortened Future  Traumatic Brain Injury: No   Past Psychiatric History: Diagnosis: Maj. 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 Hyperlipidemia     takes Atorvastatin daily  . Anxiety     takes Xanax daily as needed  . Depression     takes Cymbalta daily  . Hypertension     takes Losaratn daily  . Insomnia     takes Zyprexa nightly  . Pneumonia around 2005  . History of bronchitis around 2005  . Headache(784.0)     takes Topamax nightly;last migraine has been a while  . GERD (gastroesophageal reflux disease)     takes Pantoprazole daily as needed  . Diverticulitis   . History of kidney stones   . Anemia many yrs ago    not on any meds  . History of blood transfusion     no abnormal reaction noted  . Cataracts, bilateral     immature  . History of shingles    History of Loss of Consciousness:  No Seizure History:  No Cardiac History:  No Allergies:see below   Allergies  Allergen Reactions  . Codeine     Nausea/vomiting   Current Medications:  Current Outpatient Prescriptions  Medication Sig Dispense Refill  . ALPRAZolam (XANAX) 0.5 MG tablet Take 1 tablet  (0.5 mg total) by mouth 3 (three) times daily as needed for anxiety. 90 tablet 2  . atorvastatin (LIPITOR) 10 MG tablet Take 10 mg by mouth every morning.     . butalbital-acetaminophen-caffeine (FIORICET, ESGIC) 50-325-40 MG per tablet Take 1 tablet by mouth 2 (two) times daily as needed for headache.     . docusate sodium 100 MG CAPS Take 100 mg by mouth daily. (Patient taking differently: Take 100 mg by mouth daily as needed. ) 10 capsule 0  . DULoxetine (CYMBALTA) 60 MG capsule Take 1 capsule (60 mg total) by mouth 2 (two) times daily. 60 capsule 2  . exemestane (AROMASIN) 25 MG tablet Take 25 mg by mouth as directed.     . lamoTRIgine (LAMICTAL) 100 MG tablet Take 1 tablet (100 mg total) by mouth at bedtime. 30 tablet 2  . methocarbamol (ROBAXIN) 500 MG tablet Take 1 tablet (500  mg total) by mouth every 6 (six) hours as needed for muscle spasms. 40 tablet 1  . OLANZapine (ZYPREXA) 5 MG tablet Take 1 tablet (5 mg total) by mouth at bedtime. 30 tablet 2  . pantoprazole (PROTONIX) 40 MG tablet Take 40 mg by mouth daily.     Marland Kitchen topiramate (TOPAMAX) 100 MG tablet Take 100 mg by mouth daily.     Marland Kitchen topiramate (TOPAMAX) 50 MG tablet Take 50 mg by mouth daily.    . vitamin B-12 (CYANOCOBALAMIN) 1000 MCG tablet Take 1,000 mcg by mouth daily.     Marland Kitchen losartan (COZAAR) 100 MG tablet Take 100 mg by mouth every morning.      No current facility-administered medications for this visit.    Previous Psychotropic Medications:  Medication Dose  Clonazepam 0.5 mg bid  Cymbalta  60 mg qhs  Lamictal 100 mg qhs               Substance Abuse History in the last 12 months:none                                                                                                   Medical Consequences of Substance Abuse:n/a  Legal Consequences of Substance Abuse: n/a  Family Consequences of Substance Abuse: n/a  Blackouts:  No DT's:  No Withdrawal Symptoms:  No None  Social  History: Current Place of Residence: [place], [place] Family Members: Husband 4 children 6 grandchildren Marital Status:  Married Children: see above  Sons: See above  Daughters: See above Relationships: See above Education:  HS Graduate Educational Problems/Performance: Religious Beliefs/Practices: Christian History of Abuse: none Pensions consultant; former Solicitor History:  None. Legal History:none Hobbies/Interests: Used to love ceramics  Family History:   Family History  Problem Relation Age of Onset  . Depression Mother   . Alcohol abuse Father     Mental Status Examination/Evaluation: Objective:  Appearance: Well Groomed  Engineer, water::  Fair  Speech: Clear   Volume:  Normal   Mood: good  Affect brighter   Thought Process:  Coherent  Orientation:  Full (Time, Place, and Person)  Thought Content:  Negative  Suicidal Thoughts:  no  Homicidal Thoughts:  No  Judgement:  Good   Insight:  Good   Psychomotor Activity:  Decreased  Akathisia:  No  Handed:  Right  AIMS (if indicated):  n/a  Assets:  Communication Skills Desire for Improvement Intimacy Social Support Talents/Skills    Laboratory/X-Ray Psychological Evaluation(s)  We'll have these sent from her primary Dr.   none   Assessment: AXIS I Maj. depression, recurrent severe, generalized anxiety disorder  AXIS II Deferred  AXIS III Past Medical History  Diagnosis Date  . Hyperlipidemia     takes Atorvastatin daily  . Anxiety     takes Xanax daily as needed  . Depression     takes Cymbalta daily  . Hypertension     takes Losaratn daily  . Insomnia     takes Zyprexa nightly  . Pneumonia around 2005  . History of bronchitis around  2005  . Headache(784.0)     takes Topamax nightly;last migraine has been a while  . GERD (gastroesophageal reflux disease)     takes Pantoprazole daily as needed  . Diverticulitis   . History of kidney stones   . Anemia many yrs  ago    not on any meds  . History of blood transfusion     no abnormal reaction noted  . Cataracts, bilateral     immature  . History of shingles    migraine headache, history of colon resection and hernia repair, history of right kidney tumor removal   AXIS IV minimal  AXIS V 41-50 serious symptoms   Treatment Plan/Recommendations:  Plan of Care: Medications and psychotherapy   Laboratory:  We will have these sent over from her primary physician   Psychotherapy: She tells me she really doesn't feel comfortable doing the counseling   Medications: She'll continue Cymbalta for depression, Zyprexa and Lamictal for mood stabilization and Xanax for anxiety.   Routine PRN Medications:  No  Consultations:none  Safety Concerns: She and her husband have been instructed to call if her depression or suicidal ideation worsens.   Other: He'll return in 3 months     Caitlin Spiller, MD 11/22/20161:22 PM

## 2015-05-24 ENCOUNTER — Ambulatory Visit (HOSPITAL_COMMUNITY): Payer: Self-pay | Admitting: Psychiatry

## 2015-08-15 ENCOUNTER — Ambulatory Visit (INDEPENDENT_AMBULATORY_CARE_PROVIDER_SITE_OTHER): Payer: Medicare HMO | Admitting: Psychiatry

## 2015-08-15 ENCOUNTER — Encounter (HOSPITAL_COMMUNITY): Payer: Self-pay | Admitting: Psychiatry

## 2015-08-15 VITALS — BP 112/66 | HR 95 | Ht 60.0 in | Wt 158.6 lb

## 2015-08-15 DIAGNOSIS — F411 Generalized anxiety disorder: Secondary | ICD-10-CM | POA: Diagnosis not present

## 2015-08-15 DIAGNOSIS — F331 Major depressive disorder, recurrent, moderate: Secondary | ICD-10-CM | POA: Diagnosis not present

## 2015-08-15 MED ORDER — DULOXETINE HCL 60 MG PO CPEP: 60.0000 mg | ORAL_CAPSULE | Freq: Two times a day (BID) | ORAL | Status: DC

## 2015-08-15 MED ORDER — LAMOTRIGINE 100 MG PO TABS: 100.0000 mg | ORAL_TABLET | Freq: Every day | ORAL | Status: DC

## 2015-08-15 MED ORDER — OLANZAPINE 5 MG PO TABS: 5.0000 mg | ORAL_TABLET | Freq: Every day | ORAL | Status: DC

## 2015-08-15 MED ORDER — ALPRAZOLAM 0.5 MG PO TABS: 0.5000 mg | ORAL_TABLET | Freq: Three times a day (TID) | ORAL | Status: DC | PRN

## 2015-08-15 NOTE — Progress Notes (Signed)
Patient ID: Caitlin [name], female   DOB: 1950/07/06, 65 y.o.   MRN: [id number] Patient ID: Caitlin [name], female   DOB: 1950-09-04, 65 y.o.   MRN: [id number] Patient ID: Caitlin [name], female   DOB: 05/27/1951, 65 y.o.   MRN: [id number] Patient ID: Caitlin [name], female   DOB: June 18, 1951, 65 y.o.   MRN: [id number] Patient ID: Caitlin [name], female   DOB: Jan 10, 1951, 65 y.o.   MRN: [id number] Patient ID: Caitlin [name], female   DOB: [birth year], 65 y.o.   MRN: [id number] Patient ID: Caitlin [name], female   DOB: 1951/05/07, 65 y.o.   MRN: [id number] Patient ID: Caitlin [name], female   DOB: [birth year], 65 y.o.   MRN: [id number] Patient ID: Caitlin [name], female   DOB: 1951/03/26, 65 y.o.   MRN: [id number] Patient ID: Caitlin [name], female   DOB: March 28, 1951, 65 y.o.   MRN: [id number] Patient ID: Caitlin [name], female   DOB: 12-16-1950, 65 y.o.   MRN: [id number]  Psychiatric Assessment Adult  Patient Identification:  Caitlin [name] Date of Evaluation:  08/15/2015 Chief Complaint: "Nothing interests me"  History of Chief Complaint:  The patient has a history of depression and anxiety dating back to the late 90s  Chief Complaint  Patient presents with  . Depression  . Anxiety  . Follow-up    Depression        Associated symptoms include no fatigue and no headaches.  Past medical history includes anxiety.   Anxiety Patient reports no nervous/anxious behavior.     this patient is a 65 year old married white female who lives with her husband in [place]. She has 4 children and 6 grandchildren. She worked in a [employer] until they let her go in 2005. She is self-referred.  The patient states that her depression started in the late 90s. She had to have an emergency colostomy due to diverticulosis. She had complications from the surgery and had to have a hernia repair and several other surgeries on her abdomen. She is left with a huge scar which lowered her self-confidence and self-esteem. She also suffers from migraine  headaches.  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 Since then she's really gone downhill. She's become increasingly depressed and anxious. She's been seen at [facility] since 2009 but feels like the most recent psychiatrist does not listen to her and changed all the medications that were helpful. Currently she has lots of headaches, she's tired all the time and doesn't leave the house. She feels like she is a burden to everyone around her and has passive suicidal ideation but no plan. She sleeps okay her appetite has diminished.  In 2011 she was admitted to [place] because she was depressed and psychotic. She thinks this was a reaction to a medication that she got from migraine headache. She doesn't recall the name of the medicine. She was not hospitalized since then has had little therapy  Patient returns after  3 months. She has a bad cold and cough and is on  Azithromycin. She seems tired today. However she states her mood is been stable and she denies any suicidal ideation. She is sleeping well at night. She still feels that her medications are helpful Review of Systems  Constitutional: Negative for fatigue.  Neurological: Negative for headaches.  Psychiatric/Behavioral: Positive for depression. The patient is not nervous/anxious.    chronic migraine headaches Physical Exam not done  Depressive Symptoms: depressed mood, anhedonia, psychomotor retardation, fatigue, feelings of worthlessness/guilt, difficulty concentrating, hopelessness, impaired memory, suicidal thoughts without plan, anxiety, loss of energy/fatigue, weight loss,  (Hypo) Manic Symptoms:   Elevated Mood:  No Irritable Mood:  No Grandiosity:  No Distractibility:  No Labiality of Mood:  No Delusions:  No Hallucinations:  No Impulsivity:  No Sexually Inappropriate Behavior:  No Financial Extravagance:  No Flight of Ideas:  No  Anxiety Symptoms: Excessive Worry:   yes Panic Symptoms:  Yes Agoraphobia:  Yes Obsessive Compulsive: Yes  Symptoms: None, Specific Phobias:  No Social Anxiety:  Yes  Psychotic Symptoms:  Hallucinations: No None Delusions:  No Paranoia:  No   Ideas of Reference:  No  PTSD Symptoms: Ever had a traumatic exposure:  No Had a traumatic exposure in the last month:  No Re-experiencing: No None Hypervigilance:  No Hyperarousal: No None Avoidance: Yes Foreshortened Future  Traumatic Brain Injury: No   Past Psychiatric History: Diagnosis: Maj. 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 Hyperlipidemia     takes Atorvastatin daily  . Anxiety     takes Xanax daily as needed  . Depression     takes Cymbalta daily  . Hypertension     takes Losaratn daily  . Insomnia     takes Zyprexa nightly  . Pneumonia around 2005  . History of bronchitis around 2005  . Headache(784.0)     takes Topamax nightly;last migraine has been a while  . GERD (gastroesophageal reflux disease)     takes Pantoprazole daily as needed  . Diverticulitis   . History of kidney stones   . Anemia many yrs ago    not on any meds  . History of blood transfusion     no abnormal reaction noted  . Cataracts, bilateral     immature  . History of shingles    History of Loss of Consciousness:  No Seizure History:  No Cardiac History:  No Allergies:see below   Allergies  Allergen Reactions  . Codeine     Nausea/vomiting   Current Medications:  Current Outpatient Prescriptions  Medication Sig Dispense Refill  . ALPRAZolam (XANAX) 0.5 MG tablet Take 1 tablet (0.5 mg total) by mouth 3 (three) times daily as  needed for anxiety. 90 tablet 2  . atorvastatin (LIPITOR) 10 MG tablet Take 10 mg by mouth every morning.     . butalbital-acetaminophen-caffeine (FIORICET, ESGIC) 50-325-40 MG per tablet Take 1 tablet by mouth 2 (two) times daily as needed for headache.     . DULoxetine (CYMBALTA) 60 MG capsule Take 1 capsule (60 mg total) by mouth 2 (two) times daily. 60 capsule 2  . exemestane (AROMASIN) 25 MG tablet Take 25 mg by mouth as directed.     . lamoTRIgine (LAMICTAL) 100 MG tablet Take 1 tablet (100 mg total) by mouth at bedtime. 30 tablet 2  . losartan (COZAAR) 100 MG tablet Take 100 mg by mouth every morning.     . methocarbamol (ROBAXIN) 500 MG tablet Take 1 tablet (500 mg total) by mouth every 6 (six) hours as needed for muscle spasms. 40 tablet 1  . OLANZapine (  ZYPREXA) 5 MG tablet Take 1 tablet (5 mg total) by mouth at bedtime. 30 tablet 2  . pantoprazole (PROTONIX) 40 MG tablet Take 40 mg by mouth daily.     Marland Kitchen topiramate (TOPAMAX) 100 MG tablet Take 100 mg by mouth daily.     Marland Kitchen topiramate (TOPAMAX) 50 MG tablet Take 50 mg by mouth daily.    . vitamin B-12 (CYANOCOBALAMIN) 1000 MCG tablet Take 1,000 mcg by mouth daily.     Marland Kitchen docusate sodium 100 MG CAPS Take 100 mg by mouth daily. (Patient not taking: Reported on 08/15/2015) 10 capsule 0   No current facility-administered medications for this visit.    Previous Psychotropic Medications:  Medication Dose  Clonazepam 0.5 mg bid  Cymbalta  60 mg qhs  Lamictal 100 mg qhs               Substance Abuse History in the last 12 months:none                                                                                                   Medical Consequences of Substance Abuse:n/a  Legal Consequences of Substance Abuse: n/a  Family Consequences of Substance Abuse: n/a  Blackouts:  No DT's:  No Withdrawal Symptoms:  No None  Social History: Current Place of Residence: [place], [place] Place of [place] Family Members:  Husband 4 children 6 grandchildren Marital Status:  Married Children: see above  Sons: See above  Daughters: See above Relationships: See above Education:  HS Graduate Educational Problems/Performance: Religious Beliefs/Practices: Christian History of Abuse: none Pensions consultant; former Solicitor History:  None. Legal History:none Hobbies/Interests: Used to love ceramics  Family History:   Family History  Problem Relation Age of Onset  . Depression Mother   . Alcohol abuse Father     Mental Status Examination/Evaluation: Objective:  Appearance: Well Groomed  Engineer, water::  Fair  Speech: Soft, coughing a lot   Volume:  Normal   Mood: good  Affect bright   Thought Process:  Coherent  Orientation:  Full (Time, Place, and Person)  Thought Content:  Negative  Suicidal Thoughts:  no  Homicidal Thoughts:  No  Judgement:  Good   Insight:  Good   Psychomotor Activity:  Decreased  Akathisia:  No  Handed:  Right  AIMS (if indicated):  n/a  Assets:  Communication Skills Desire for Improvement Intimacy Social Support Talents/Skills    Laboratory/X-Ray Psychological Evaluation(s)  We'll have these sent from her primary Dr.   none   Assessment: AXIS I Maj. depression, recurrent severe, generalized anxiety disorder  AXIS II Deferred  AXIS III Past Medical History  Diagnosis Date  . Hyperlipidemia     takes Atorvastatin daily  . Anxiety     takes Xanax daily as needed  . Depression     takes Cymbalta daily  . Hypertension     takes Losaratn daily  . Insomnia     takes Zyprexa nightly  . Pneumonia around 2005  . History of bronchitis around 2005  . Headache(784.0)     takes Topamax nightly;last  migraine has been a while  . GERD (gastroesophageal reflux disease)     takes Pantoprazole daily as needed  . Diverticulitis   . History of kidney stones   . Anemia many yrs ago    not on any meds  . History of blood transfusion     no abnormal  reaction noted  . Cataracts, bilateral     immature  . History of shingles    migraine headache, history of colon resection and hernia repair, history of right kidney tumor removal   AXIS IV minimal  AXIS V 41-50 serious symptoms   Treatment Plan/Recommendations:  Plan of Care: Medications and psychotherapy   Laboratory:  We will have these sent over from her primary physician   Psychotherapy: She tells me she really doesn't feel comfortable doing the counseling   Medications: She'll continue Cymbalta for depression, Zyprexa and Lamictal for mood stabilization and Xanax for anxiety.   Routine PRN Medications:  No  Consultations:none  Safety Concerns: She and her husband have been instructed to call if her depression or suicidal ideation worsens.   Other: she'll return in 3 months     [name], MD 2/22/20171:21 PM

## 2015-11-05 ENCOUNTER — Telehealth (HOSPITAL_COMMUNITY): Payer: Self-pay | Admitting: *Deleted

## 2015-11-05 ENCOUNTER — Other Ambulatory Visit (HOSPITAL_COMMUNITY): Payer: Self-pay | Admitting: Psychiatry

## 2015-11-05 NOTE — Telephone Encounter (Signed)
She should not be out of anything yet

## 2015-11-05 NOTE — Telephone Encounter (Signed)
Pt pharmacy requesting refills for pt Cymbalta, Lamictal, Olanzapine and Xanax via e-scribe. Pt medications all last filled 08-15-15 with 2 refills. Pt have appt scheduled for 11-13-15.

## 2015-11-05 NOTE — Telephone Encounter (Signed)
noted 

## 2015-11-13 ENCOUNTER — Encounter (HOSPITAL_COMMUNITY): Payer: Self-pay | Admitting: Psychiatry

## 2015-11-13 ENCOUNTER — Ambulatory Visit (INDEPENDENT_AMBULATORY_CARE_PROVIDER_SITE_OTHER): Payer: Medicare HMO | Admitting: Psychiatry

## 2015-11-13 VITALS — BP 142/81 | HR 86 | Ht 60.0 in | Wt 158.0 lb

## 2015-11-13 DIAGNOSIS — F331 Major depressive disorder, recurrent, moderate: Secondary | ICD-10-CM | POA: Diagnosis not present

## 2015-11-13 MED ORDER — DULOXETINE HCL 60 MG PO CPEP: 60.0000 mg | ORAL_CAPSULE | Freq: Two times a day (BID) | ORAL | Status: DC

## 2015-11-13 MED ORDER — ALPRAZOLAM 0.5 MG PO TABS: 0.5000 mg | ORAL_TABLET | Freq: Two times a day (BID) | ORAL | Status: DC | PRN

## 2015-11-13 MED ORDER — LAMOTRIGINE 100 MG PO TABS: 100.0000 mg | ORAL_TABLET | Freq: Every day | ORAL | Status: DC

## 2015-11-13 MED ORDER — OLANZAPINE 5 MG PO TABS: 5.0000 mg | ORAL_TABLET | Freq: Every day | ORAL | Status: DC

## 2015-11-13 NOTE — Progress Notes (Signed)
Patient ID: Caitlin [name], female   DOB: August 28, 1950, 65 y.o.   MRN: [id number] Patient ID: ZOEE FUSSELL, female   DOB: 1951/04/28, 65 y.o.   MRN: [id number] Patient ID: JILLYN ARTUSO, female   DOB: 1950/09/03, 65 y.o.   MRN: [id number] Patient ID: KENZIE EITZEN, female   DOB: [birth year], 65 y.o.   MRN: [id number] Patient ID: AHMIYAH KOSKELA, female   DOB: 10/20/1950, 65 y.o.   MRN: [id number] Patient ID: LEANNY KELDER, female   DOB: 1951-01-04, 65 y.o.   MRN: [id number] Patient ID: CAMARIE LOYE, female   DOB: 1951-03-27, 65 y.o.   MRN: [id number] Patient ID: JEN EVATT, female   DOB: October 13, 1950, 65 y.o.   MRN: [id number] Patient ID: KAILEIGH DUXBURY, female   DOB: 1951-01-31, 65 y.o.   MRN: [id number] Patient ID: ONEITA ZEHNDER, female   DOB: 06-06-1951, 65 y.o.   MRN: [id number] Patient ID: MERANDA VONDRA, female   DOB: 05-Mar-1951, 65 y.o.   MRN: [id number] Patient ID: REACE FARQUHAR, female   DOB: [birth year], 65 y.o.   MRN: [id number]  Psychiatric Assessment Adult  Patient Identification:  CAHTERINE CONNEELY Date of Evaluation:  11/13/2015 Chief Complaint: "Nothing interests me"  History of Chief Complaint:  The patient has a history of depression and anxiety dating back to the late 90s  Chief Complaint  Patient presents with  . Depression  . Anxiety  . Follow-up    Depression        Associated symptoms include no fatigue and no headaches.  Past medical history includes anxiety.   Anxiety Patient reports no nervous/anxious behavior.     this patient is a 65 year old married white female who lives with her husband in [place]. She has 4 children and 6 grandchildren. She worked in a [employer] until they let her go in 2005. She is self-referred.  The patient states that her depression started in the late 90s. She had to have an emergency colostomy due to diverticulosis. She had complications from the surgery and had to have a hernia repair and several other surgeries on her abdomen. She is left with a huge scar which lowered  her self-confidence and self-esteem. She also suffers from migraine headaches.  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 Since then she's really gone downhill. She's become increasingly depressed and anxious. She's been seen at [facility] since 2009 but feels like the most recent psychiatrist does not listen to her and changed all the medications that were helpful. Currently she has lots of headaches, she's tired all the time and doesn't leave the house. She feels like she is a burden to everyone around her and has passive suicidal ideation but no plan. She sleeps okay her appetite has diminished.  In 2011 she was admitted to [place] because she was depressed and psychotic. She thinks this was a reaction to a medication that she got from migraine headache. She doesn't recall the name of the medicine. She was not hospitalized since then has had little therapy  Patient returns after  3 months. She reports that she feels sleepy all the time. She sleeps all night and take several naps during the day. She is on several sedating medicines at night including Topamax and olanzapine. She takes Xanax first thing in the morning. I noted that the Xanax is supposed to be an as needed medicine and suggested she leave it often low she  feels very anxious and she agrees. She's very worried because she has to have a colonoscopy and hasn't had one in about 20 years. When she was at her doctor's office they found blood in her stool. I explained that everyone over 50 is supposed to have a colonoscopy every 10 years anyway Review of Systems  Constitutional: Negative for fatigue.  Neurological: Negative for headaches.  Psychiatric/Behavioral: Positive for depression. The patient is not nervous/anxious.    chronic migraine headaches Physical Exam not done  Depressive Symptoms: depressed mood, anhedonia, psychomotor retardation, fatigue, feelings of  worthlessness/guilt, difficulty concentrating, hopelessness, impaired memory, suicidal thoughts without plan, anxiety, loss of energy/fatigue, weight loss,  (Hypo) Manic Symptoms:   Elevated Mood:  No Irritable Mood:  No Grandiosity:  No Distractibility:  No Labiality of Mood:  No Delusions:  No Hallucinations:  No Impulsivity:  No Sexually Inappropriate Behavior:  No Financial Extravagance:  No Flight of Ideas:  No  Anxiety Symptoms: Excessive Worry:  yes Panic Symptoms:  Yes Agoraphobia:  Yes Obsessive Compulsive: Yes  Symptoms: None, Specific Phobias:  No Social Anxiety:  Yes  Psychotic Symptoms:  Hallucinations: No None Delusions:  No Paranoia:  No   Ideas of Reference:  No  PTSD Symptoms: Ever had a traumatic exposure:  No Had a traumatic exposure in the last month:  No Re-experiencing: No None Hypervigilance:  No Hyperarousal: No None Avoidance: Yes Foreshortened Future  Traumatic Brain Injury: No   Past Psychiatric History: Diagnosis: Maj. 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 Hyperlipidemia     takes Atorvastatin daily  . Anxiety     takes Xanax daily as needed  . Depression     takes Cymbalta daily  . Hypertension     takes Losaratn daily  . Insomnia     takes Zyprexa nightly  . Pneumonia around 2005  . History of bronchitis around 2005  . Headache(784.0)     takes Topamax nightly;last migraine has been a while  . GERD (gastroesophageal reflux disease)     takes Pantoprazole daily as needed  . Diverticulitis   . History of kidney stones   . Anemia many yrs ago    not on any meds  . History of blood  transfusion     no abnormal reaction noted  . Cataracts, bilateral     immature  . History of shingles    History of Loss of Consciousness:  No Seizure History:  No Cardiac History:  No Allergies:see below   Allergies  Allergen Reactions  . Codeine     Nausea/vomiting   Current Medications:  Current Outpatient Prescriptions  Medication Sig Dispense Refill  . ALPRAZolam (XANAX) 0.5 MG tablet Take 1 tablet (0.5 mg total) by mouth 2 (two) times daily as needed for anxiety. 60 tablet 2  . atorvastatin (LIPITOR) 10 MG tablet Take 10 mg by mouth every morning.     . butalbital-acetaminophen-caffeine (FIORICET, ESGIC) 50-325-40 MG per tablet Take 1 tablet by mouth 2 (two) times daily as needed for headache.     . DULoxetine (CYMBALTA) 60 MG capsule Take 1 capsule (60 mg total) by mouth 2 (two) times daily.  60 capsule 2  . exemestane (AROMASIN) 25 MG tablet Take 25 mg by mouth as directed.     . lamoTRIgine (LAMICTAL) 100 MG tablet Take 1 tablet (100 mg total) by mouth at bedtime. 30 tablet 2  . losartan (COZAAR) 100 MG tablet Take 100 mg by mouth every morning.     Marland Kitchen OLANZapine (ZYPREXA) 5 MG tablet Take 1 tablet (5 mg total) by mouth at bedtime. 30 tablet 2  . pantoprazole (PROTONIX) 40 MG tablet Take 40 mg by mouth daily.     Marland Kitchen topiramate (TOPAMAX) 100 MG tablet Take 100 mg by mouth daily.     Marland Kitchen topiramate (TOPAMAX) 50 MG tablet Take 50 mg by mouth daily.    . vitamin B-12 (CYANOCOBALAMIN) 1000 MCG tablet Take 1,000 mcg by mouth daily.      No current facility-administered medications for this visit.    Previous Psychotropic Medications:  Medication Dose  Clonazepam 0.5 mg bid  Cymbalta  60 mg qhs  Lamictal 100 mg qhs               Substance Abuse History in the last 12 months:none                                                                                                   Medical Consequences of Substance Abuse:n/a  Legal Consequences of Substance  Abuse: n/a  Family Consequences of Substance Abuse: n/a  Blackouts:  No DT's:  No Withdrawal Symptoms:  No None  Social History: Current Place of Residence: [place], [place] of [place] Family Members: Husband 4 children 6 grandchildren Marital Status:  Married Children: see above  Sons: See above  Daughters: See above Relationships: See above Education:  HS Graduate Educational Problems/Performance: Religious Beliefs/Practices: Christian History of Abuse: none Pensions consultant; former Solicitor History:  None. Legal History:none Hobbies/Interests: Used to love ceramics  Family History:   Family History  Problem Relation Age of Onset  . Depression Mother   . Alcohol abuse Father     Mental Status Examination/Evaluation: Objective:  Appearance: Well Groomed  Engineer, water::  Fair  Speech: Soft,  Volume:  Normal   Mood: good  AffectA little blunted   Thought Process:  Coherent  Orientation:  Full (Time, Place, and Person)  Thought Content:  Negative  Suicidal Thoughts:  no  Homicidal Thoughts:  No  Judgement:  Good   Insight:  Good   Psychomotor Activity:  Decreased  Akathisia:  No  Handed:  Right  AIMS (if indicated):  n/a  Assets:  Communication Skills Desire for Improvement Intimacy Social Support Talents/Skills    Laboratory/X-Ray Psychological Evaluation(s)  We'll have these sent from her primary Dr.   none   Assessment: AXIS I Maj. depression, recurrent severe, generalized anxiety disorder  AXIS II Deferred  AXIS III Past Medical History  Diagnosis Date  . Hyperlipidemia     takes Atorvastatin daily  . Anxiety     takes Xanax daily as needed  . Depression     takes Cymbalta daily  . Hypertension  takes Losaratn daily  . Insomnia     takes Zyprexa nightly  . Pneumonia around 2005  . History of bronchitis around 2005  . Headache(784.0)     takes Topamax nightly;last migraine has been a while  . GERD  (gastroesophageal reflux disease)     takes Pantoprazole daily as needed  . Diverticulitis   . History of kidney stones   . Anemia many yrs ago    not on any meds  . History of blood transfusion     no abnormal reaction noted  . Cataracts, bilateral     immature  . History of shingles    migraine headache, history of colon resection and hernia repair, history of right kidney tumor removal   AXIS IV minimal  AXIS V 41-50 serious symptoms   Treatment Plan/Recommendations:  Plan of Care: Medications and psychotherapy   Laboratory:  We will have these sent over from her primary physician   Psychotherapy: She tells me she really doesn't feel comfortable doing the counseling   Medications: She'll continue Cymbalta for depression, Zyprexa and Lamictal for mood stabilization and Xanax for anxiety. She has been instructed to only uses Xanax as needed because of her drowsiness   Routine PRN Medications:  No  Consultations:none  Safety Concerns: She and her husband have been instructed to call if her depression or suicidal ideation worsens.   Other: she'll return in 3 months     [name], MD 5/23/20171:22 PM

## 2015-11-21 ENCOUNTER — Other Ambulatory Visit (HOSPITAL_COMMUNITY): Payer: Self-pay | Admitting: Oncology

## 2015-11-21 DIAGNOSIS — Z1231 Encounter for screening mammogram for malignant neoplasm of breast: Secondary | ICD-10-CM

## 2015-11-21 DIAGNOSIS — J479 Bronchiectasis, uncomplicated: Secondary | ICD-10-CM | POA: Insufficient documentation

## 2015-12-10 ENCOUNTER — Ambulatory Visit (HOSPITAL_COMMUNITY)
Admission: RE | Admit: 2015-12-10 | Discharge: 2015-12-10 | Disposition: A | Discharge status: Home / Self Care | Payer: Medicare HMO | Source: Ambulatory Visit | Attending: Oncology | Admitting: Oncology

## 2015-12-10 DIAGNOSIS — Z1231 Encounter for screening mammogram for malignant neoplasm of breast: Secondary | ICD-10-CM | POA: Diagnosis present

## 2016-01-07 ENCOUNTER — Ambulatory Visit (INDEPENDENT_AMBULATORY_CARE_PROVIDER_SITE_OTHER): Payer: Medicare HMO | Admitting: Otolaryngology

## 2016-01-07 DIAGNOSIS — K1121 Acute sialoadenitis: Secondary | ICD-10-CM | POA: Diagnosis not present

## 2016-01-14 ENCOUNTER — Ambulatory Visit (INDEPENDENT_AMBULATORY_CARE_PROVIDER_SITE_OTHER): Payer: Medicare HMO | Admitting: Otolaryngology

## 2016-01-14 DIAGNOSIS — K1121 Acute sialoadenitis: Secondary | ICD-10-CM

## 2016-02-13 ENCOUNTER — Ambulatory Visit (HOSPITAL_COMMUNITY): Payer: Self-pay | Admitting: Psychiatry

## 2016-03-04 ENCOUNTER — Other Ambulatory Visit (HOSPITAL_COMMUNITY): Payer: Self-pay | Admitting: Psychiatry

## 2016-03-05 ENCOUNTER — Telehealth (HOSPITAL_COMMUNITY): Payer: Self-pay | Admitting: *Deleted

## 2016-03-05 NOTE — Telephone Encounter (Signed)
phone call from patient's husband, she need refill on all except for  Xanax.

## 2016-03-06 ENCOUNTER — Telehealth (HOSPITAL_COMMUNITY): Payer: Self-pay | Admitting: *Deleted

## 2016-03-06 NOTE — Telephone Encounter (Signed)
Pt husband called stating is out of her Xanax. Pt medication was last printed on 11-13-15 with 60 tabs 2 refills. Pt was scheduled to return on 02-13-16 but provider was out of office. Pt is rescheduled to return on 03-10-16. Called pt and spoke with pt husband. Per pt husband, they did not need refills for her Xanax. Per pt husband, he had printed script from last visit. Per pt husband, they have 120 tablets. Pt number is [phone].

## 2016-03-06 NOTE — Telephone Encounter (Signed)
Pt husband called stating is out of her Xanax. Pt medication was last printed on 11-13-15 with 60 tabs 2 refills. Pt was scheduled to return on 02-13-16 but provider was out of office. Pt is rescheduled to return on 03-10-16. Called pt and spoke with pt husband. Per pt husband, they did not need refills for her Xanax. Per pt husband, he had printed script from last visit. Per pt husband, they have 120 tablets. Pt number is [phone].

## 2016-03-10 ENCOUNTER — Encounter (HOSPITAL_COMMUNITY): Payer: Self-pay | Admitting: Psychiatry

## 2016-03-10 ENCOUNTER — Ambulatory Visit (INDEPENDENT_AMBULATORY_CARE_PROVIDER_SITE_OTHER): Payer: Medicare HMO | Admitting: Psychiatry

## 2016-03-10 VITALS — BP 108/72 | HR 92 | Ht 60.0 in | Wt 156.0 lb

## 2016-03-10 DIAGNOSIS — F331 Major depressive disorder, recurrent, moderate: Secondary | ICD-10-CM | POA: Diagnosis not present

## 2016-03-10 DIAGNOSIS — F411 Generalized anxiety disorder: Secondary | ICD-10-CM

## 2016-03-10 MED ORDER — LAMOTRIGINE 100 MG PO TABS: 100.0000 mg | ORAL_TABLET | Freq: Every day | ORAL | 2 refills | Status: DC

## 2016-03-10 MED ORDER — ALPRAZOLAM 0.5 MG PO TABS: 0.5000 mg | ORAL_TABLET | Freq: Two times a day (BID) | ORAL | 2 refills | Status: DC | PRN

## 2016-03-10 MED ORDER — OLANZAPINE 5 MG PO TABS: 5.0000 mg | ORAL_TABLET | Freq: Every day | ORAL | 2 refills | Status: DC

## 2016-03-10 MED ORDER — DULOXETINE HCL 60 MG PO CPEP: 60.0000 mg | ORAL_CAPSULE | Freq: Two times a day (BID) | ORAL | 2 refills | Status: DC

## 2016-03-10 NOTE — Progress Notes (Signed)
Patient ID: Caitlin [name], female   DOB: [birth year], 65 y.o.   MRN: [id number] Patient ID: Caitlin [name], female   DOB: [birth year], 65 y.o.   MRN: [id number] Patient ID: Caitlin [name], female   DOB: [birth year], 65 y.o.   MRN: [id number] Patient ID: Caitlin [name], female   DOB: Apr 18, 1951, 65 y.o.   MRN: [id number] Patient ID: Caitlin [name], female   DOB: 1951-03-12, 65 y.o.   MRN: [id number] Patient ID: Caitlin [name], female   DOB: [birth year], 65 y.o.   MRN: [id number] Patient ID: Caitlin [name], female   DOB: Jul 21, 1950, 65 y.o.   MRN: [id number] Patient ID: Caitlin [name], female   DOB: 04-Feb-1951, 65 y.o.   MRN: [id number] Patient ID: Caitlin [name], female   DOB: 1951/05/24, 65 y.o.   MRN: [id number] Patient ID: Caitlin [name], female   DOB: 1951/03/05, 65 y.o.   MRN: [id number] Patient ID: Caitlin [name], female   DOB: 12-01-1950, 65 y.o.   MRN: [id number] Patient ID: Caitlin [name], female   DOB: 09-28-1950, 65 y.o.   MRN: [id number]  Psychiatric Assessment Adult  Patient Identification:  [name] Date of Evaluation:  03/10/2016 Chief Complaint: "Nothing interests me"  History of Chief Complaint:  The patient has a history of depression and anxiety dating back to the late 90s  Chief Complaint  Patient presents with  . Depression  . Anxiety  . Follow-up    Depression         Associated symptoms include no fatigue and no headaches.  Past medical history includes anxiety.   Anxiety  Patient reports no nervous/anxious behavior.     this patient is a 65 year old married white female who lives with her husband in [place]. She has 4 children and 6 grandchildren. She worked in a [employer] until they let her go in 2005. She is self-referred.  The patient states that her depression started in the late 90s. She had to have an emergency colostomy due to diverticulosis. She had complications from the surgery and had to have a hernia repair and several other surgeries on her abdomen. She is left with a huge scar which lowered  her self-confidence and self-esteem. She also suffers from migraine headaches.  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 Since then she's really gone downhill. She's become increasingly depressed and anxious. She's been seen at [facility] since 2009 but feels like the most recent psychiatrist does not listen to her and changed all the medications that were helpful. Currently she has lots of headaches, she's tired all the time and doesn't leave the house. She feels like she is a burden to everyone around her and has passive suicidal ideation but no plan. She sleeps okay her appetite has diminished.  In 2011 she was admitted to [facility] because she was depressed and psychotic. She thinks this was a reaction to a medication that she got from migraine headache. She doesn't recall the name of the medicine. She was not hospitalized since then has had little therapy  Patient returns after  3 months. She is with her husband. She states her mood is good and she's not as drowsy as she was in the past. She only takes Xanax twice a day now. She's very comfortable at home and doesn't like going out but goes out about once a week with her husband to [place]. She denies that depression or anxiety is  keeping her in the house. She is sleeping well. She denies being depressed and claims that she is "content." Review of Systems  Constitutional: Negative for fatigue.  Neurological: Negative for headaches.  Psychiatric/Behavioral: Positive for depression. The patient is not nervous/anxious.    chronic migraine headaches Physical Exam not done  Depressive Symptoms: depressed mood, anhedonia, psychomotor retardation, fatigue, feelings of worthlessness/guilt, difficulty concentrating, hopelessness, impaired memory, suicidal thoughts without plan, anxiety, loss of energy/fatigue, weight loss,  (Hypo) Manic Symptoms:   Elevated Mood:  No Irritable Mood:  No Grandiosity:   No Distractibility:  No Labiality of Mood:  No Delusions:  No Hallucinations:  No Impulsivity:  No Sexually Inappropriate Behavior:  No Financial Extravagance:  No Flight of Ideas:  No  Anxiety Symptoms: Excessive Worry:  yes Panic Symptoms:  Yes Agoraphobia:  Yes Obsessive Compulsive: Yes  Symptoms: None, Specific Phobias:  No Social Anxiety:  Yes  Psychotic Symptoms:  Hallucinations: No None Delusions:  No Paranoia:  No   Ideas of Reference:  No  PTSD Symptoms: Ever had a traumatic exposure:  No Had a traumatic exposure in the last month:  No Re-experiencing: No None Hypervigilance:  No Hyperarousal: No None Avoidance: Yes Foreshortened Future  Traumatic Brain Injury: No   Past Psychiatric History: Diagnosis: Maj. 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 Anemia many yrs ago   not on any meds  . Anxiety    takes Xanax daily as needed  . Cataracts, bilateral    immature  . Depression    takes Cymbalta daily  . Diverticulitis   . GERD (gastroesophageal reflux disease)    takes Pantoprazole daily as needed  . Headache(784.0)    takes Topamax nightly;last migraine has been a while  . History of blood transfusion    no abnormal reaction noted  . History of bronchitis around 2005  . History of kidney stones   . History of shingles   . Hyperlipidemia    takes Atorvastatin daily  . Hypertension    takes Losaratn daily  . Insomnia    takes Zyprexa nightly  . Pneumonia around 2005   History of Loss of Consciousness:  No Seizure History:  No Cardiac History:  No Allergies:see below   Allergies  Allergen Reactions  .  Codeine     Nausea/vomiting   Current Medications:  Current Outpatient Prescriptions  Medication Sig Dispense Refill  . ALPRAZolam (XANAX) 0.5 MG tablet Take 1 tablet (0.5 mg total) by mouth 2 (two) times daily as needed for anxiety. 60 tablet 2  . atorvastatin (LIPITOR) 10 MG tablet Take 10 mg by mouth every morning.     . butalbital-acetaminophen-caffeine (FIORICET, ESGIC) 50-325-40 MG per tablet Take 1 tablet by mouth 2 (two) times daily as needed for headache.     . DULoxetine (CYMBALTA) 60 MG capsule Take 1 capsule (60 mg total) by mouth 2 (two) times daily. 60 capsule 2  . exemestane (AROMASIN) 25 MG tablet Take 25 mg by mouth as directed.     . lamoTRIgine (LAMICTAL) 100 MG tablet Take 1 tablet (100 mg total) by mouth at bedtime. 30 tablet 2  . OLANZapine (ZYPREXA)  5 MG tablet Take 1 tablet (5 mg total) by mouth at bedtime. 30 tablet 2  . pantoprazole (PROTONIX) 40 MG tablet Take 40 mg by mouth daily as needed (for acid reflux).     . topiramate (TOPAMAX) 100 MG tablet Take 100 mg by mouth daily.     Marland Kitchen topiramate (TOPAMAX) 50 MG tablet Take 50 mg by mouth daily.    . vitamin B-12 (CYANOCOBALAMIN) 1000 MCG tablet Take 1,000 mcg by mouth daily.      No current facility-administered medications for this visit.     Previous Psychotropic Medications:  Medication Dose  Clonazepam 0.5 mg bid  Cymbalta  60 mg qhs  Lamictal 100 mg qhs               Substance Abuse History in the last 12 months:none                                                                                                   Medical Consequences of Substance Abuse:n/a  Legal Consequences of Substance Abuse: n/a  Family Consequences of Substance Abuse: n/a  Blackouts:  No DT's:  No Withdrawal Symptoms:  No None  Social History: Current Place of Residence: [place], [place] Place of [place] Family Members: Husband 4 children 6 grandchildren Marital Status:  Married Children: see  above  Sons: See above  Daughters: See above Relationships: See above Education:  HS Graduate Educational Problems/Performance: Religious Beliefs/Practices: Christian History of Abuse: none Pensions consultant; former Solicitor History:  None. Legal History:none Hobbies/Interests: Used to love ceramics  Family History:   Family History  Problem Relation Age of Onset  . Depression Mother   . Alcohol abuse Father     Mental Status Examination/Evaluation: Objective:  Appearance: Well Groomed  Engineer, water::  Fair  Speech: Soft,  Volume:  Normal   Mood: good  AffectA little blunted   Thought Process:  Coherent  Orientation:  Full (Time, Place, and Person)  Thought Content:  Negative  Suicidal Thoughts:  no  Homicidal Thoughts:  No  Judgement:  Good   Insight:  Good   Psychomotor Activity:  Decreased  Akathisia:  No  Handed:  Right  AIMS (if indicated):  n/a  Assets:  Communication Skills Desire for Improvement Intimacy Social Support Talents/Skills    Laboratory/X-Ray Psychological Evaluation(s)  We'll have these sent from her primary Dr.   none   Assessment: AXIS I Maj. depression, recurrent severe, generalized anxiety disorder  AXIS II Deferred  AXIS III Past Medical History:  Diagnosis Date  . Anemia many yrs ago   not on any meds  . Anxiety    takes Xanax daily as needed  . Cataracts, bilateral    immature  . Depression    takes Cymbalta daily  . Diverticulitis   . GERD (gastroesophageal reflux disease)    takes Pantoprazole daily as needed  . Headache(784.0)    takes Topamax nightly;last migraine has been a while  . History of blood transfusion    no abnormal reaction noted  . History of bronchitis around 2005  .  History of kidney stones   . History of shingles   . Hyperlipidemia    takes Atorvastatin daily  . Hypertension    takes Losaratn daily  . Insomnia    takes Zyprexa nightly  . Pneumonia around 2005   migraine  headache, history of colon resection and hernia repair, history of right kidney tumor removal   AXIS IV minimal  AXIS V 41-50 serious symptoms   Treatment Plan/Recommendations:  Plan of Care: Medications and psychotherapy   Laboratory:  We will have these sent over from her primary physician   Psychotherapy: She tells me she really doesn't feel comfortable doing the counseling   Medications: She'll continue Cymbalta for depression, Zyprexa and Lamictal for mood stabilization and Xanax for anxiety at 0.5 mg twice a day   Routine PRN Medications:  No  Consultations:none  Safety Concerns: She and her husband have been instructed to call if her depression or suicidal ideation worsens.   Other: she'll return in 3 months     [name], MD 9/18/20172:01 PM

## 2016-04-01 ENCOUNTER — Encounter (HOSPITAL_COMMUNITY): Payer: Self-pay | Admitting: Hematology & Oncology

## 2016-04-01 ENCOUNTER — Encounter (HOSPITAL_COMMUNITY): Payer: Medicare HMO | Attending: Hematology & Oncology | Admitting: Hematology & Oncology

## 2016-04-01 VITALS — BP 140/69 | HR 85 | Temp 98.4°F | Resp 16 | Wt 154.6 lb

## 2016-04-01 DIAGNOSIS — D0510 Intraductal carcinoma in situ of unspecified breast: Secondary | ICD-10-CM | POA: Insufficient documentation

## 2016-04-01 DIAGNOSIS — Z23 Encounter for immunization: Secondary | ICD-10-CM

## 2016-04-01 DIAGNOSIS — N183 Chronic kidney disease, stage 3 (moderate): Secondary | ICD-10-CM

## 2016-04-01 DIAGNOSIS — C50112 Malignant neoplasm of central portion of left female breast: Secondary | ICD-10-CM | POA: Diagnosis not present

## 2016-04-01 DIAGNOSIS — D631 Anemia in chronic kidney disease: Secondary | ICD-10-CM | POA: Diagnosis not present

## 2016-04-01 DIAGNOSIS — Z79811 Long term (current) use of aromatase inhibitors: Secondary | ICD-10-CM

## 2016-04-01 DIAGNOSIS — B370 Candidal stomatitis: Secondary | ICD-10-CM

## 2016-04-01 DIAGNOSIS — R682 Dry mouth, unspecified: Secondary | ICD-10-CM

## 2016-04-01 DIAGNOSIS — N1831 Chronic kidney disease, stage 3a: Secondary | ICD-10-CM

## 2016-04-01 DIAGNOSIS — Z0000 Encounter for general adult medical examination without abnormal findings: Secondary | ICD-10-CM

## 2016-04-01 LAB — CBC WITH DIFFERENTIAL/PLATELET
BASOS ABS: 0 10*3/uL (ref 0.0–0.1)
BASOS PCT: 0 %
EOS ABS: 0.2 10*3/uL (ref 0.0–0.7)
Eosinophils Relative: 3 %
HEMATOCRIT: 33.9 % — AB (ref 36.0–46.0)
HEMOGLOBIN: 10.8 g/dL — AB (ref 12.0–15.0)
LYMPHS PCT: 24 %
Lymphs Abs: 1.5 10*3/uL (ref 0.7–4.0)
MCH: 27.2 pg (ref 26.0–34.0)
MCHC: 31.9 g/dL (ref 30.0–36.0)
MCV: 85.4 fL (ref 78.0–100.0)
Monocytes Absolute: 0.6 10*3/uL (ref 0.1–1.0)
Monocytes Relative: 9 %
NEUTROS ABS: 4 10*3/uL (ref 1.7–7.7)
Neutrophils Relative %: 64 %
Platelets: 245 10*3/uL (ref 150–400)
RBC: 3.97 MIL/uL (ref 3.87–5.11)
RDW: 15 % (ref 11.5–15.5)
WBC: 6.4 10*3/uL (ref 4.0–10.5)

## 2016-04-01 LAB — COMPREHENSIVE METABOLIC PANEL
ALBUMIN: 3.9 g/dL (ref 3.5–5.0)
ALK PHOS: 66 U/L (ref 38–126)
ALT: 16 U/L (ref 14–54)
ANION GAP: 6 (ref 5–15)
AST: 17 U/L (ref 15–41)
BILIRUBIN TOTAL: 0.4 mg/dL (ref 0.3–1.2)
BUN: 24 mg/dL — AB (ref 6–20)
CO2: 22 mmol/L (ref 22–32)
Calcium: 9.8 mg/dL (ref 8.9–10.3)
Chloride: 109 mmol/L (ref 101–111)
Creatinine, Ser: 1.6 mg/dL — ABNORMAL HIGH (ref 0.44–1.00)
GFR calc Af Amer: 38 mL/min — ABNORMAL LOW (ref >60)
GFR calc non Af Amer: 33 mL/min — ABNORMAL LOW (ref >60)
GLUCOSE: 91 mg/dL (ref 65–99)
POTASSIUM: 4 mmol/L (ref 3.5–5.1)
SODIUM: 137 mmol/L (ref 135–145)
TOTAL PROTEIN: 8.6 g/dL — AB (ref 6.5–8.1)

## 2016-04-01 MED ORDER — EXEMESTANE 25 MG PO TABS: 25.0000 mg | ORAL_TABLET | ORAL | 3 refills | Status: DC

## 2016-04-01 MED ORDER — INFLUENZA VAC SPLIT QUAD 0.5 ML IM SUSY
0.5000 mL | PREFILLED_SYRINGE | Freq: Once | INTRAMUSCULAR | Status: AC
  Administered 2016-04-01: 0.5 mL via INTRAMUSCULAR

## 2016-04-01 MED ORDER — INFLUENZA VAC SPLIT QUAD 0.5 ML IM SUSY
PREFILLED_SYRINGE | INTRAMUSCULAR | Status: AC
  Filled 2016-04-01: qty 0.5

## 2016-04-01 MED ORDER — CLOTRIMAZOLE 10 MG MT LOZG: 10.0000 mg | LOZENGE | Freq: Every day | OROMUCOSAL | 0 refills | Status: DC

## 2016-04-01 NOTE — Patient Instructions (Addendum)
[place] at [facility] Discharge Instructions  RECOMMENDATIONS MADE BY THE CONSULTANT AND ANY TEST RESULTS WILL BE SENT TO YOUR REFERRING PHYSICIAN.  You saw Dr. Whitney Muse today. Labs today. Follow up in 3 months.  Thank you for choosing [place] at [facility] to provide your oncology and hematology care.  To afford each patient quality time with our provider, please arrive at least 15 minutes before your scheduled appointment time.   Beginning January 23rd 2017 lab work for the [facility] will be done in the  Main lab at [facility] on 1st floor. If you have a lab appointment with the [facility] please come in thru the  Main Entrance and check in at the main information desk  You need to re-schedule your appointment should you arrive 10 or more minutes late.  We strive to give you quality time with our providers, and arriving late affects you and other patients whose appointments are after yours.  Also, if you no show three or more times for appointments you may be dismissed from the clinic at the providers discretion.     Again, thank you for choosing [facility].  Our hope is that these requests will decrease the amount of time that you wait before being seen by our physicians.       _____________________________________________________________  Should you have questions after your visit to [facility], please contact our office at [phone] between the hours of 8:30 a.m. and 4:30 p.m.  Voicemails left after 4:30 p.m. will not be returned until the following business day.  For prescription refill requests, have your pharmacy contact our office.         Resources For Cancer Patients and their Caregivers ? American Cancer Society: Can assist with transportation, wigs, general needs, runs Look Good Feel Better.        904-743-2077 ? Cancer Care: Provides financial assistance, online support groups,  medication/co-pay assistance.  1-800-813-HOPE (905)466-5908) ? [facility] Assists [place] cancer patients and their families through emotional , educational and financial support.  289-118-6111 ? [place] DSS Where to apply for food stamps, Medicaid and utility assistance. 973 332 9790 ? [facility]: Transportation to medical appointments. 3432712671 ? Social Security Administration: May apply for disability if have a Stage IV cancer. 681 347 9102 (214)553-7343 ? [employer], Disability and Transit Services: Assists with nutrition, care and transit needs. [place] Support Programs: @10RELATIVEDAYS @ > Cancer Support Group  2nd Tuesday of the month 1pm-2pm, Journey Room  > Creative Journey  3rd Tuesday of the month 1130am-1pm, Journey Room  > Look Good Feel Better  1st Wednesday of the month 10am-12 noon, Journey Room (Call [place] to register [phone])

## 2016-04-01 NOTE — Progress Notes (Signed)
Caitlin [name] presents today for injection per MD orders. Flu Vaccine administered IM in right Upper Arm. Administration without incident. Patient tolerated well.

## 2016-04-01 NOTE — Progress Notes (Signed)
[name]  Patient Care Team: Caitlin Chroman, MD as PCP - General (Internal Medicine)  CHIEF COMPLAINTS/PURPOSE OF CONSULTATION:    Malignant neoplasm of central portion of left breast in female, estrogen receptor positive ([place])   02/10/2014 Surgery    Left modified radical mastectomy with sentinel LN X 1      02/10/2014 Pathology Results    6.5 cm IDC, ER+ 100%, PR + 96%, Ki 67 20% HER 2 -, grade II, LVI +, negative margins 1/1 negative sentinel nodes      10/11/2014 -  Anti-estrogen oral therapy    Exemestane 25 mg daily       HISTORY OF PRESENTING ILLNESS:  Caitlin [name] 65 y.o. female is here for consultation due to left breast cancer ER+/PR+/HER2-. 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 Patient declined radiation therapy and did not see a radiation therapist until eight months after her surgery. She is on exemestane. Of note, initial biopsy showed DCIS. Patient was followed by Caitlin [name] at [facility]. Secondary to its closure she is transferring care here.   Patient had renal impairment. She is seeing a urologist at [facility]. Her next appointment is 10/17. She has spoken with Caitlin [name] about a nephrology referral but is not interested at this time.   She has been under the care of Caitlin [name]. PCP is Caitlin [name]  Today, she is accompanied by her husband. Patient says she found out she had breast cancer when she went for a mammogram for the first time in a long time. Caitlin [name] preformed her surgery. Caitlin [name] has been getting regular mammograms since her diagnosis and self-checks for lumps regularly. Last mammogram was in June of this year. She gets her mammograms at [facility].   She currently sees psychiatrist for depression and anxiety.   She takes 25 mg of exemestane every night and experiences no issues with it.    Caitlin [name] did a bone density test this year. He told Caitlin [name] that her bones were thinning and placed her  on vitamin D.   [name] states that her breathing has improved since she quit smoking. However, she has cough that will subside for day or two and then it comes back. Caitlin [name] told her that it was a smoker's cough, but Caitlin [name] feels like it is a little late to continue to have a smoker's cough because she quit smoking four years ago. She states that Caitlin [name] prescribed her Dukes mouthwash for her cough. She had a chest CT and it was normal. Per available records she has significant emphysema and bronchiectatic changes on her CT imaging.  She has had a colonoscopy. She has had colostomy for diverticulitis in the past. This has been reversed.  Patient needs refill for exemestane.   MEDICAL HISTORY:  Past Medical History:  Diagnosis Date  . Anemia many yrs ago   not on any meds  . Anxiety    takes Xanax daily as needed  . Cataracts, bilateral    immature  . Depression    takes Cymbalta daily  . Diverticulitis   . GERD (gastroesophageal reflux disease)    takes Pantoprazole daily as needed  . Headache(784.0)    takes Topamax nightly;last migraine has been a while  . History of blood transfusion    no abnormal reaction noted  . History of bronchitis around 2005  . History of kidney stones   . History of  shingles   . Hyperlipidemia    takes Atorvastatin daily  . Hypertension    takes Losaratn daily  . Insomnia    takes Zyprexa nightly  . Pneumonia around 2005    SURGICAL HISTORY: Past Surgical History:  Procedure Laterality Date  . BREAST RECONSTRUCTION WITH PLACEMENT OF TISSUE EXPANDER AND FLEX HD (ACELLULAR HYDRATED DERMIS) Left 02/10/2014   Procedure: LEFT BREAST RECONSTRUCTION WITH PLACEMENT OF TISSUE EXPANDER AND  FLEX HD (ACELLULAR HYDRATED DERMIS);  Surgeon: Crissie Reese, MD;  Location: [facility];  Service: Plastics;  Laterality: Left;  . COLON SURGERY     d/t diverticulitis  . colostomy take down    . ESOPHAGOGASTRODUODENOSCOPY    . HERNIA REPAIR  2014  . INCISIONAL HERNIA  REPAIR    . R kidney tumor removed    . SIMPLE MASTECTOMY WITH AXILLARY SENTINEL NODE BIOPSY Left 02/10/2014   Procedure: LEFT TOTAL MASTECTOMY WITH AXILLARY SENTINEL NODE BIOPSY;  Surgeon: Edward Jolly, MD;  Location: [place] [place];  Service: General;  Laterality: Left;  . TONSILLECTOMY    . TUBAL LIGATION      SOCIAL HISTORY: Social History   Social History  . Marital status: Married    Spouse name: N/A  . Number of children: N/A  . Years of education: N/A   Occupational History  . Not on file.   Social History Main Topics  . Smoking status: Former Research scientist (life sciences)  . Smokeless tobacco: Never Used     Comment: quit smoking almost 3 yrs ago  . Alcohol use No  . Drug use: No  . Sexual activity: Not Currently    Birth control/ protection: Post-menopausal   Other Topics Concern  . Not on file   Social History Narrative  . No narrative on file   Patient married 50 years. 4 children. 7 grandchildren. 1 great grandchild. Former smoker; quit 4-5 years ago She enjoys ceramics No longer has pets  FAMILY HISTORY: Family History  Problem Relation Age of Onset  . Depression Mother   . Alcohol abuse Father   Both parents deceased; mother died in her 28s; father died 44 (suicide) Sibling passed from asthma No family history of breast cancer.  ALLERGIES:  is allergic to codeine.  MEDICATIONS:  Current Outpatient Prescriptions  Medication Sig Dispense Refill  . ALPRAZolam (XANAX) 0.5 MG tablet Take 1 tablet (0.5 mg total) by mouth 2 (two) times daily as needed for anxiety. 60 tablet 2  . atorvastatin (LIPITOR) 10 MG tablet Take 10 mg by mouth every morning.     . butalbital-acetaminophen-caffeine (FIORICET, ESGIC) 50-325-40 MG per tablet Take 1 tablet by mouth 2 (two) times daily as needed for headache.     . DULoxetine (CYMBALTA) 60 MG capsule Take 1 capsule (60 mg total) by mouth 2 (two) times daily. 60 capsule 2  . exemestane (AROMASIN) 25 MG tablet Take 1 tablet (25 mg total) by  mouth as directed. 30 tablet 3  . lamoTRIgine (LAMICTAL) 100 MG tablet Take 1 tablet (100 mg total) by mouth at bedtime. 30 tablet 2  . OLANZapine (ZYPREXA) 5 MG tablet Take 1 tablet (5 mg total) by mouth at bedtime. 30 tablet 2  . pantoprazole (PROTONIX) 40 MG tablet Take 40 mg by mouth daily as needed (for acid reflux).     . topiramate (TOPAMAX) 100 MG tablet Take 100 mg by mouth daily.     Marland Kitchen topiramate (TOPAMAX) 50 MG tablet Take 50 mg by mouth daily.    . vitamin  B-12 (CYANOCOBALAMIN) 1000 MCG tablet Take 1,000 mcg by mouth daily.     . clotrimazole (MYCELEX) 10 MG troche Take 1 lozenge (10 mg total) by mouth 5 (five) times daily. 50 lozenge 0   No current facility-administered medications for this visit.     Review of Systems  Constitutional: Negative.   HENT: Negative.   Eyes: Negative.   Respiratory: Positive for cough and shortness of breath.   Cardiovascular: Negative.   Gastrointestinal: Negative.        Dry Mouth  Genitourinary: Negative.   Musculoskeletal: Positive for joint pain.  Skin: Negative.   Neurological: Negative.   Endo/Heme/Allergies: Negative.   Psychiatric/Behavioral: Negative.   All other systems reviewed and are negative. 14 point ROS was done and is otherwise as detailed above or in HPI   PHYSICAL EXAMINATION: ECOG PERFORMANCE STATUS: 1 - Symptomatic but completely ambulatory  Vitals:   04/01/16 1434  BP: 140/69  Pulse: 85  Resp: 16  Temp: 98.4 F (36.9 C)   Filed Weights   04/01/16 1434  Weight: 154 lb 9.6 oz (70.1 kg)    Physical Exam  Constitutional: She is oriented to person, place, and time and well-developed, well-nourished, and in no distress. No distress.  HENT:  Head: Normocephalic and atraumatic.  Mouth/Throat: Oropharynx is clear and moist.  Oral candidiasis Dry mucus membranes  Eyes: Conjunctivae and EOM are normal. Pupils are equal, round, and reactive to light. Right eye exhibits no discharge. Left eye exhibits no  discharge. No scleral icterus.  Neck: Normal range of motion. Neck supple. No thyromegaly present.  Cardiovascular: Normal rate, regular rhythm and normal heart sounds.   Pulmonary/Chest: Effort normal and breath sounds normal. No respiratory distress.  Abdominal: Soft. Bowel sounds are normal. She exhibits no distension and no mass. There is no tenderness. There is no rebound and no guarding.  Musculoskeletal: Normal range of motion. She exhibits no edema or tenderness.  Lymphadenopathy:    She has no cervical adenopathy.  Neurological: She is alert and oriented to person, place, and time. No cranial nerve deficit. Gait normal. Coordination normal.  Skin: Skin is warm and dry. No rash noted. She is not diaphoretic. No erythema.  Psychiatric: Mood, memory, affect and judgment normal.  Nursing note and vitals reviewed.   LABORATORY DATA:  I have reviewed the data as listed Lab Results  Component Value Date   WBC 6.4 04/01/2016   HGB 10.8 (L) 04/01/2016   HCT 33.9 (L) 04/01/2016   MCV 85.4 04/01/2016   PLT 245 04/01/2016   CMP     Component Value Date/Time   NA 137 04/01/2016 1636   K 4.0 04/01/2016 1636   CL 109 04/01/2016 1636   CO2 22 04/01/2016 1636   GLUCOSE 91 04/01/2016 1636   BUN 24 (H) 04/01/2016 1636   CREATININE 1.60 (H) 04/01/2016 1636   CALCIUM 9.8 04/01/2016 1636   PROT 8.6 (H) 04/01/2016 1636   ALBUMIN 3.9 04/01/2016 1636   AST 17 04/01/2016 1636   ALT 16 04/01/2016 1636   ALKPHOS 66 04/01/2016 1636   BILITOT 0.4 04/01/2016 1636   GFRNONAA 33 (L) 04/01/2016 1636   GFRAA 38 (L) 04/01/2016 1636     RADIOGRAPHIC STUDIES: I have personally reviewed the radiological images as listed and agreed with the findings in the report. No results found.    ASSESSMENT & PLAN:  Stage IIB carcinoma of L Breast ER+, PR+, HER 2 - Long term use of aromatase inhibitor HX R renal  oncocytoma, s/p partial nephrectomy at [facility] Stage III CKD Dry mouth Anemia Oral  candidiasis  She will continue her aromasin. I would like to obtain a copy of her DEXA. She will continue her calcium and vitamin D. She is up to date on mammography. Aromasin was refilled today.   She has a mild anemia which I suspect is secondary to her CKD. We can observe this for now. I did discuss this with her today.   Her dry mouth is very uncomfortable for her and she attributes this to medications. She notes that she tries many OTC remedies without relief. I will check sjogren's antibodies today. She is agreeable. She has oral candidiasis, I have prescribed her mycelex troches.   She will continue to follow with Caitlin [name] for her primary care needs.    ORDERS PLACED FOR THIS ENCOUNTER: Orders Placed This Encounter  Procedures  . CBC with Differential  . Comprehensive metabolic panel  . Sjogren's syndrome antibods(ssa + ssb)    MEDICATIONS PRESCRIBED THIS ENCOUNTER: Meds ordered this encounter  Medications  . exemestane (AROMASIN) 25 MG tablet    Sig: Take 1 tablet (25 mg total) by mouth as directed.    Dispense:  30 tablet    Refill:  3  . clotrimazole (MYCELEX) 10 MG troche    Sig: Take 1 lozenge (10 mg total) by mouth 5 (five) times daily.    Dispense:  50 lozenge    Refill:  0  . Influenza vac split quadrivalent PF (FLUARIX) injection 0.5 mL    Follow up with patient in 3 months.   All questions were answered. The patient knows to call the clinic with any problems, questions or concerns.  This document serves as a record of services personally performed by Ancil Linsey, MD. It was created on her behalf by Elmyra Ricks, a trained medical scribe. The creation of this record is based on the scribe's personal observations and the provider's statements to them. This document has been checked and approved by the attending provider.  I have reviewed the above documentation for accuracy and completeness and I agree with the above.  This note was electronically signed.    Molli Hazard, MD  04/06/2016 4:50 PM

## 2016-04-02 LAB — SJOGREN'S SYNDROME ANTIBODS(SSA + SSB)

## 2016-04-03 MED ORDER — ACETAMINOPHEN 325 MG PO TABS
ORAL_TABLET | ORAL | Status: AC
  Filled 2016-04-03: qty 2

## 2016-04-04 ENCOUNTER — Encounter (HOSPITAL_COMMUNITY): Payer: Self-pay | Admitting: Lab

## 2016-04-04 NOTE — Progress Notes (Unsigned)
Referral sent to Dr Gavin Pound.  Records faxed on 10/13.  They will contact patient.

## 2016-04-06 ENCOUNTER — Encounter (HOSPITAL_COMMUNITY): Payer: Self-pay | Admitting: Hematology & Oncology

## 2016-04-06 DIAGNOSIS — N183 Chronic kidney disease, stage 3 unspecified: Secondary | ICD-10-CM | POA: Insufficient documentation

## 2016-06-04 ENCOUNTER — Other Ambulatory Visit (HOSPITAL_COMMUNITY): Payer: Self-pay | Admitting: Psychiatry

## 2016-06-09 ENCOUNTER — Ambulatory Visit (INDEPENDENT_AMBULATORY_CARE_PROVIDER_SITE_OTHER): Payer: Medicare HMO | Admitting: Psychiatry

## 2016-06-09 ENCOUNTER — Encounter (HOSPITAL_COMMUNITY): Payer: Self-pay | Admitting: Psychiatry

## 2016-06-09 VITALS — BP 127/81 | HR 107 | Ht 60.0 in | Wt 150.8 lb

## 2016-06-09 DIAGNOSIS — Z79899 Other long term (current) drug therapy: Secondary | ICD-10-CM

## 2016-06-09 DIAGNOSIS — F331 Major depressive disorder, recurrent, moderate: Secondary | ICD-10-CM | POA: Diagnosis not present

## 2016-06-09 DIAGNOSIS — Z818 Family history of other mental and behavioral disorders: Secondary | ICD-10-CM

## 2016-06-09 DIAGNOSIS — Z811 Family history of alcohol abuse and dependence: Secondary | ICD-10-CM | POA: Diagnosis not present

## 2016-06-09 MED ORDER — LAMOTRIGINE 100 MG PO TABS: 100.0000 mg | ORAL_TABLET | Freq: Every day | ORAL | 2 refills | Status: DC

## 2016-06-09 MED ORDER — DULOXETINE HCL 60 MG PO CPEP: 60.0000 mg | ORAL_CAPSULE | Freq: Two times a day (BID) | ORAL | 2 refills | Status: DC

## 2016-06-09 MED ORDER — OLANZAPINE 5 MG PO TABS: 5.0000 mg | ORAL_TABLET | Freq: Every day | ORAL | 2 refills | Status: DC

## 2016-06-09 NOTE — Progress Notes (Signed)
Patient ID: Caitlin [name], female   DOB: 09/18/1950, 65 y.o.   MRN: [id number] Patient ID: Caitlin [name], female   DOB: 1951/05/23, 65 y.o.   MRN: [id number] Patient ID: Caitlin [name], female   DOB: 08/09/1950, 65 y.o.   MRN: [id number] Patient ID: Caitlin [name], female   DOB: 01-11-1951, 65 y.o.   MRN: [id number] Patient ID: Caitlin [name], female   DOB: Oct 31, 1950, 65 y.o.   MRN: [id number] Patient ID: Caitlin [name], female   DOB: 1950/11/19, 65 y.o.   MRN: [id number] Patient ID: Caitlin [name], female   DOB: 12-17-1950, 65 y.o.   MRN: [id number] Patient ID: NORALBA [name], female   DOB: 1951-02-24, 65 y.o.   MRN: [id number] Patient ID: Caitlin [name], female   DOB: Mar 08, 1951, 65 y.o.   MRN: [id number] Patient ID: Caitlin [name], female   DOB: 29-Oct-1950, 65 y.o.   MRN: [id number] Patient ID: Caitlin [name], female   DOB: 19-Feb-1951, 65 y.o.   MRN: [id number] Patient ID: Caitlin [name], female   DOB: [birth year], 65 y.o.   MRN: [id number]  Psychiatric Assessment Adult  Patient Identification:  Caitlin [name] Date of Evaluation:  06/09/2016 Chief Complaint: "Nothing interests me"  History of Chief Complaint:  The patient has a history of depression and anxiety dating back to the late 90s  Chief Complaint  Patient presents with  . Depression  . Anxiety  . Follow-up    Depression         Associated symptoms include no fatigue and no headaches.  Past medical history includes anxiety.   Anxiety  Patient reports no nervous/anxious behavior.     this patient is a 65 year old married white female who lives with her husband in [place]. She has 4 children and 6 grandchildren. She worked in a [employer] until they let her go in 2005. She is self-referred.  The patient states that her depression started in the late 90s. She had to have an emergency colostomy due to diverticulosis. She had complications from the surgery and had to have a hernia repair and several other surgeries on her abdomen. She is left with a huge scar which lowered  her self-confidence and self-esteem. She also suffers from migraine headaches.  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 Since then she's really gone downhill. She's become increasingly depressed and anxious. She's been seen at [facility] since 2009 but feels like the most recent psychiatrist does not listen to her and changed all the medications that were helpful. Currently she has lots of headaches, she's tired all the time and doesn't leave the house. She feels like she is a burden to everyone around her and has passive suicidal ideation but no plan. She sleeps okay her appetite has diminished.  In 2011 she was admitted to [facility] because she was depressed and psychotic. She thinks this was a reaction to a medication that she got from migraine headache. She doesn't recall the name of the medicine. She was not hospitalized since then has had little therapy  Patient returns after  3 months. She is with her husband. She states her mood is good and she is less anxious. She is getting out a lot right now doing Christmas shopping. Unfortunately she has lost her voice and has to speak in a whisper. She was recently diagnosed with Sjgren syndrome and has terribly dry mouth. She's been given nystatin and some other  sort of spray but nothing has helped so far. She was recently seen by rheumatologist. It's unclear whether or not her loss of voices do the Sjogren's. She still feels that her medications for depression anxiety and sleep have been very helpful Review of Systems  Constitutional: Negative for fatigue.  Neurological: Negative for headaches.  Psychiatric/Behavioral: Positive for depression. The patient is not nervous/anxious.    chronic migraine headaches Physical Exam not done  Depressive Symptoms: depressed mood, anhedonia, psychomotor retardation, fatigue, feelings of worthlessness/guilt, difficulty concentrating, hopelessness, impaired  memory, suicidal thoughts without plan, anxiety, loss of energy/fatigue, weight loss,  (Hypo) Manic Symptoms:   Elevated Mood:  No Irritable Mood:  No Grandiosity:  No Distractibility:  No Labiality of Mood:  No Delusions:  No Hallucinations:  No Impulsivity:  No Sexually Inappropriate Behavior:  No Financial Extravagance:  No Flight of Ideas:  No  Anxiety Symptoms: Excessive Worry:  yes Panic Symptoms:  Yes Agoraphobia:  Yes Obsessive Compulsive: Yes  Symptoms: None, Specific Phobias:  No Social Anxiety:  Yes  Psychotic Symptoms:  Hallucinations: No None Delusions:  No Paranoia:  No   Ideas of Reference:  No  PTSD Symptoms: Ever had a traumatic exposure:  No Had a traumatic exposure in the last month:  No Re-experiencing: No None Hypervigilance:  No Hyperarousal: No None Avoidance: Yes Foreshortened Future  Traumatic Brain Injury: No   Past Psychiatric History: Diagnosis: Maj. depression, generalized anxiety disorder   Hospitalizations: Once in 2011   Outpatient Care: At [facility] since 2009   Substance Abuse Care: n/a  Self-Mutilation: None   Suicidal Attempts: None   Violent Behaviors: None    Past Medical History: History of colon surgery, incisional hernia repair right kidney tumor removed in March 2014  Past Medical History:  Diagnosis Date  . Anemia many yrs ago   not on any meds  . Anxiety    takes Xanax daily as needed  . Cataracts, bilateral    immature  . Depression    takes Cymbalta daily  . Diverticulitis   . GERD (gastroesophageal reflux disease)    takes Pantoprazole daily as needed  . Headache(784.0)    takes Topamax nightly;last migraine has been a while  . History of blood transfusion    no abnormal reaction noted  . History of bronchitis around 2005  . History of kidney stones   . History of shingles   . Hyperlipidemia    takes Atorvastatin daily  . Hypertension    takes Losaratn daily  . Insomnia    takes Zyprexa nightly   . Pneumonia around 2005   History of Loss of Consciousness:  No Seizure History:  No Cardiac History:  No Allergies:see below   Allergies  Allergen Reactions  . Codeine     Nausea/vomiting   Current Medications:  Current Outpatient Prescriptions  Medication Sig Dispense Refill  . ALPRAZolam (XANAX) 0.5 MG tablet Take 1 tablet (0.5 mg total) by mouth 2 (two) times daily as needed for anxiety. 60 tablet 2  . atorvastatin (LIPITOR) 10 MG tablet Take 10 mg by mouth every morning.     . butalbital-acetaminophen-caffeine (FIORICET, ESGIC) 50-325-40 MG per tablet Take 1 tablet by mouth 2 (two) times daily as needed for headache.     . DULoxetine (CYMBALTA) 60 MG capsule Take 1 capsule (60 mg total) by mouth 2 (two) times daily. 60 capsule 2  . exemestane (AROMASIN) 25 MG tablet Take 1 tablet (25 mg total) by mouth as directed. [place]  tablet 3  . lamoTRIgine (LAMICTAL) 100 MG tablet Take 1 tablet (100 mg total) by mouth at bedtime. 30 tablet 2  . nystatin (MYCOSTATIN) 100000 UNIT/ML suspension     . OLANZapine (ZYPREXA) 5 MG tablet Take 1 tablet (5 mg total) by mouth at bedtime. 30 tablet 2  . pantoprazole (PROTONIX) 40 MG tablet Take 40 mg by mouth daily as needed (for acid reflux).     . topiramate (TOPAMAX) 100 MG tablet Take 100 mg by mouth daily.     Marland Kitchen topiramate (TOPAMAX) 50 MG tablet Take 50 mg by mouth daily.    . vitamin B-12 (CYANOCOBALAMIN) 1000 MCG tablet Take 1,000 mcg by mouth daily.      No current facility-administered medications for this visit.     Previous Psychotropic Medications:  Medication Dose  Clonazepam 0.5 mg bid  Cymbalta  60 mg qhs  Lamictal 100 mg qhs               Substance Abuse History in the last 12 months:none                                                                                                   Medical Consequences of Substance Abuse:n/a  Legal Consequences of Substance Abuse: n/a  Family Consequences of Substance  Abuse: n/a  Blackouts:  No DT's:  No Withdrawal Symptoms:  No None  Social History: Current Place of Residence: [place], [place] Family Members: Husband 4 children 6 grandchildren Marital Status:  Married Children: see above  Sons: See above  Daughters: See above Relationships: See above Education:  HS Graduate Educational Problems/Performance: Religious Beliefs/Practices: Christian History of Abuse: none Pensions consultant; former Solicitor History:  None. Legal History:none Hobbies/Interests: Used to love ceramics  Family History:   Family History  Problem Relation Age of Onset  . Depression Mother   . Alcohol abuse Father     Mental Status Examination/Evaluation: Objective:  Appearance: Well Groomed  Engineer, water::  Fair  Speech: Soft,  Volume:  Difficult to understand as she is speaking in a whisper   Mood: good  Affect Fairly bright   Thought Process:  Coherent  Orientation:  Full (Time, Place, and Person)  Thought Content:  Negative  Suicidal Thoughts:  no  Homicidal Thoughts:  No  Judgement:  Good   Insight:  Good   Psychomotor Activity:  Decreased  Akathisia:  No  Handed:  Right  AIMS (if indicated):  n/a  Assets:  Communication Skills Desire for Improvement Intimacy Social Support Talents/Skills    Laboratory/X-Ray Psychological Evaluation(s)  We'll have these sent from her primary Dr.   none   Assessment: AXIS I Maj. depression, recurrent severe, generalized anxiety disorder  AXIS II Deferred  AXIS III Past Medical History:  Diagnosis Date  . Anemia many yrs ago   not on any meds  . Anxiety    takes Xanax daily as needed  . Cataracts, bilateral    immature  . Depression    takes Cymbalta daily  . Diverticulitis   . GERD (gastroesophageal reflux  disease)    takes Pantoprazole daily as needed  . Headache(784.0)    takes Topamax nightly;last migraine has been a while  . History of blood transfusion     no abnormal reaction noted  . History of bronchitis around 2005  . History of kidney stones   . History of shingles   . Hyperlipidemia    takes Atorvastatin daily  . Hypertension    takes Losaratn daily  . Insomnia    takes Zyprexa nightly  . Pneumonia around 2005   migraine headache, history of colon resection and hernia repair, history of right kidney tumor removal   AXIS IV minimal  AXIS V 41-50 serious symptoms   Treatment Plan/Recommendations:  Plan of Care: Medications and psychotherapy   Laboratory:  We will have these sent over from her primary physician   Psychotherapy: She tells me she really doesn't feel comfortable doing the counseling   Medications: She'll continue Cymbalta for depression, Zyprexa and Lamictal for mood stabilization and Xanax for anxiety at 0.5 mg twice a day   Routine PRN Medications:  No  Consultations:none  Safety Concerns: She and her husband have been instructed to call if her depression or suicidal ideation worsens.   Other: she'll return in 3 months     Jeff Frieden, Neoma Laming, MD 12/18/20171:56 PM

## 2016-07-02 ENCOUNTER — Encounter (HOSPITAL_COMMUNITY): Payer: Self-pay | Admitting: Hematology & Oncology

## 2016-07-02 ENCOUNTER — Encounter (HOSPITAL_COMMUNITY): Payer: Medicare HMO | Attending: Hematology & Oncology | Admitting: Hematology & Oncology

## 2016-07-02 VITALS — BP 133/77 | HR 88 | Temp 98.6°F | Resp 18 | Ht 60.0 in | Wt 147.5 lb

## 2016-07-02 DIAGNOSIS — C50112 Malignant neoplasm of central portion of left female breast: Secondary | ICD-10-CM | POA: Diagnosis not present

## 2016-07-02 DIAGNOSIS — Z170 Estrogen receptor positive status [ER+]: Secondary | ICD-10-CM | POA: Diagnosis not present

## 2016-07-02 DIAGNOSIS — N183 Chronic kidney disease, stage 3 unspecified: Secondary | ICD-10-CM

## 2016-07-02 DIAGNOSIS — D0510 Intraductal carcinoma in situ of unspecified breast: Secondary | ICD-10-CM | POA: Insufficient documentation

## 2016-07-02 DIAGNOSIS — Z79811 Long term (current) use of aromatase inhibitors: Secondary | ICD-10-CM

## 2016-07-02 DIAGNOSIS — R682 Dry mouth, unspecified: Secondary | ICD-10-CM

## 2016-07-02 DIAGNOSIS — D649 Anemia, unspecified: Secondary | ICD-10-CM

## 2016-07-02 DIAGNOSIS — D631 Anemia in chronic kidney disease: Secondary | ICD-10-CM

## 2016-07-02 MED ORDER — EXEMESTANE 25 MG PO TABS: 25.0000 mg | ORAL_TABLET | ORAL | 3 refills | Status: DC

## 2016-07-02 NOTE — Patient Instructions (Addendum)
[place] at [facility] Discharge Instructions  RECOMMENDATIONS MADE BY THE CONSULTANT AND ANY TEST RESULTS WILL BE SENT TO YOUR REFERRING PHYSICIAN.  You were seen today by Dr. Whitney Muse. Follow up in 4 months. Refill sent to pharmacy.   Thank you for choosing [place] at [facility] to provide your oncology and hematology care.  To afford each patient quality time with our provider, please arrive at least 15 minutes before your scheduled appointment time.    If you have a lab appointment with the [place] please come in thru the  Main Entrance and check in at the main information desk  You need to re-schedule your appointment should you arrive 10 or more minutes late.  We strive to give you quality time with our providers, and arriving late affects you and other patients whose appointments are after yours.  Also, if you no show three or more times for appointments you may be dismissed from the clinic at the providers discretion.     Again, thank you for choosing [facility].  Our hope is that these requests will decrease the amount of time that you wait before being seen by our physicians.       _____________________________________________________________  Should you have questions after your visit to [facility], please contact our office at [phone] between the hours of 8:30 a.m. and 4:30 p.m.  Voicemails left after 4:30 p.m. will not be returned until the following business day.  For prescription refill requests, have your pharmacy contact our office.       Resources For Cancer Patients and their Caregivers ? American Cancer Society: Can assist with transportation, wigs, general needs, runs Look Good Feel Better.        352-163-8787 ? Cancer Care: Provides financial assistance, online support groups, medication/co-pay assistance.  1-800-813-HOPE 409 130 5609) ? [place] Assists [place] cancer patients and their families through emotional , educational and financial support.  857-881-5266 ? [place] DSS Where to apply for food stamps, Medicaid and utility assistance. 669 636 8757 ? [facility]: Transportation to medical appointments. 708-571-7413 ? Social Security Administration: May apply for disability if have a Stage IV cancer. 670-666-0262 430-398-8327 ? [employer], Disability and Transit Services: Assists with nutrition, care and transit needs. [place] Support Programs: @10RELATIVEDAYS @ > Cancer Support Group  2nd Tuesday of the month 1pm-2pm, Journey Room  > Creative Journey  3rd Tuesday of the month 1130am-1pm, Journey Room  > Look Good Feel Better  1st Wednesday of the month 10am-12 noon, Journey Room (Call [place] to register [phone])

## 2016-07-02 NOTE — Progress Notes (Signed)
[place]  PROGRESS NOTE  Patient Care Team: Glenda Chroman, MD as PCP - General (Internal Medicine)  CHIEF COMPLAINTS/PURPOSE OF CONSULTATION:    Malignant neoplasm of central portion of left breast in female, estrogen receptor positive ([place])   02/10/2014 Surgery    Left modified radical mastectomy with sentinel LN X 1      02/10/2014 Pathology Results    6.5 cm IDC, ER+ 100%, PR + 96%, Ki 67 20% HER 2 -, grade II, LVI +, negative margins 1/1 negative sentinel nodes      10/11/2014 -  Anti-estrogen oral therapy    Exemestane 25 mg daily       HISTORY OF PRESENTING ILLNESS:  Caitlin [name] 66 y.o. female is here for ongoing follow-up of a  left breast cancer ER+/PR+/HER2-. 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 Patient declined radiation therapy and did not see a radiation therapist until eight months after her surgery. She is on exemestane. Of note, initial biopsy showed DCIS. Patient was followed by Dr. Tressie Stalker at [facility], secondary to its closure she is now followed here.   Patient had renal impairment. She is seeing a urologist at [facility]. She has spoken with Dr. Tressie Stalker about a nephrology referral but is not interested at this time.   PCP is Dr. Woody Seller  Today, she is accompanied by her husband. I have reviewed the labs with the patient.   At her initial visit she noted a very dry mouth, she was referred to Gavin Pound when sjogren's labs were positive. Her husband notes that she has diagnosed her with Sjogren's syndrome. She is currently on Nystatin, she goes to see Dr. Trudie Reed again in a few weeks.   She is still hoarse. Her husband says she sleeps at night with her mouth open. She is congested and must cough to clear it. Her PCP gave her some antibiotics, but they haven't helped.  She still has no appetite. This is chronic.   Denies fever, diarrhea, or constipation. She takes her exemestane daily. Last mammogram was in  June. She denies any problems with her breast or chest wall.   MEDICAL HISTORY:  Past Medical History:  Diagnosis Date  . Anemia many yrs ago   not on any meds  . Anxiety    takes Xanax daily as needed  . Cataracts, bilateral    immature  . Depression    takes Cymbalta daily  . Diverticulitis   . GERD (gastroesophageal reflux disease)    takes Pantoprazole daily as needed  . Headache(784.0)    takes Topamax nightly;last migraine has been a while  . History of blood transfusion    no abnormal reaction noted  . History of bronchitis around 2005  . History of kidney stones   . History of shingles   . Hyperlipidemia    takes Atorvastatin daily  . Hypertension    takes Losaratn daily  . Insomnia    takes Zyprexa nightly  . Pneumonia around 2005    SURGICAL HISTORY: Past Surgical History:  Procedure Laterality Date  . BREAST RECONSTRUCTION WITH PLACEMENT OF TISSUE EXPANDER AND FLEX HD (ACELLULAR HYDRATED DERMIS) Left 02/10/2014   Procedure: LEFT BREAST RECONSTRUCTION WITH PLACEMENT OF TISSUE EXPANDER AND  FLEX HD (ACELLULAR HYDRATED DERMIS);  Surgeon: Crissie Reese, MD;  Location: [place];  Service: Plastics;  Laterality: Left;  . COLON SURGERY     d/t diverticulitis  . colostomy take down    .  ESOPHAGOGASTRODUODENOSCOPY    . HERNIA REPAIR  2014  . INCISIONAL HERNIA REPAIR    . R kidney tumor removed    . SIMPLE MASTECTOMY WITH AXILLARY SENTINEL NODE BIOPSY Left 02/10/2014   Procedure: LEFT TOTAL MASTECTOMY WITH AXILLARY SENTINEL NODE BIOPSY;  Surgeon: Edward Jolly, MD;  Location: [place];  Service: General;  Laterality: Left;  . TONSILLECTOMY    . TUBAL LIGATION      SOCIAL HISTORY: Social History   Social History  . Marital status: Married    Spouse name: N/A  . Number of children: N/A  . Years of education: N/A   Occupational History  . Not on file.   Social History Main Topics  . Smoking status: Former Research scientist (life sciences)  . Smokeless tobacco: Never Used     Comment:  quit smoking almost 3 yrs ago  . Alcohol use No  . Drug use: No  . Sexual activity: Not Currently    Birth control/ protection: Post-menopausal   Other Topics Concern  . Not on file   Social History Narrative  . No narrative on file   Patient married 50 years. 4 children. 7 grandchildren. 1 great grandchild. Former smoker; quit 4-5 years ago She enjoys ceramics No longer has pets  FAMILY HISTORY: Family History  Problem Relation Age of Onset  . Depression Mother   . Alcohol abuse Father   Both parents deceased; mother died in her 50s; father died 85 (suicide) Sibling passed from asthma No family history of breast cancer.  ALLERGIES:  is allergic to codeine.  MEDICATIONS:  Current Outpatient Prescriptions  Medication Sig Dispense Refill  . ALPRAZolam (XANAX) 0.5 MG tablet Take 1 tablet (0.5 mg total) by mouth 2 (two) times daily as needed for anxiety. 60 tablet 2  . atorvastatin (LIPITOR) 10 MG tablet Take 10 mg by mouth every morning.     . DULoxetine (CYMBALTA) 60 MG capsule Take 1 capsule (60 mg total) by mouth 2 (two) times daily. 60 capsule 2  . exemestane (AROMASIN) 25 MG tablet Take 1 tablet (25 mg total) by mouth as directed. 30 tablet 3  . lamoTRIgine (LAMICTAL) 100 MG tablet Take 1 tablet (100 mg total) by mouth at bedtime. 30 tablet 2  . OLANZapine (ZYPREXA) 5 MG tablet Take 1 tablet (5 mg total) by mouth at bedtime. 30 tablet 2  . pantoprazole (PROTONIX) 40 MG tablet Take 40 mg by mouth daily as needed (for acid reflux).     . topiramate (TOPAMAX) 100 MG tablet Take 100 mg by mouth daily.     Marland Kitchen topiramate (TOPAMAX) 50 MG tablet Take 50 mg by mouth daily.    . vitamin B-12 (CYANOCOBALAMIN) 1000 MCG tablet Take 1,000 mcg by mouth daily.      No current facility-administered medications for this visit.     Review of Systems  Constitutional: Negative.  Negative for fever.       Loss of appetite.   HENT: Positive for congestion.        Pos hoarseness   Eyes:  Negative.   Respiratory: Positive for sputum production.   Cardiovascular: Negative.   Gastrointestinal: Negative.  Negative for constipation and diarrhea.  Genitourinary: Negative.   Musculoskeletal: Negative.   Skin: Negative.   Neurological: Negative.   Endo/Heme/Allergies: Negative.   Psychiatric/Behavioral: Negative.   All other systems reviewed and are negative. 14 point ROS was done and is otherwise as detailed above or in HPI   PHYSICAL EXAMINATION: ECOG PERFORMANCE STATUS: 1 -  Symptomatic but completely ambulatory  Vitals:   07/02/16 1359  BP: 133/77  Pulse: 88  Resp: 18  Temp: 98.6 F (37 C)   Filed Weights   07/02/16 1359  Weight: 147 lb 8 oz (66.9 kg)     Physical Exam  Constitutional: She is oriented to person, place, and time and well-developed, well-nourished, and in no distress. No distress.  Pt was able to get on exam table without assistance. Wears glasses.   HENT:  Head: Normocephalic and atraumatic.  Mouth/Throat: Oropharynx is clear and moist.  Eyes: Conjunctivae and EOM are normal. Pupils are equal, round, and reactive to light. Right eye exhibits no discharge. Left eye exhibits no discharge. No scleral icterus.  Neck: Normal range of motion. Neck supple. No thyromegaly present.  Cardiovascular: Normal rate, regular rhythm and normal heart sounds.   Pulmonary/Chest: Effort normal and breath sounds normal. No respiratory distress.  Abdominal: Soft. Bowel sounds are normal. She exhibits no distension and no mass. There is no tenderness. There is no rebound and no guarding.  Musculoskeletal: Normal range of motion. She exhibits no edema or tenderness.  Lymphadenopathy:    She has no cervical adenopathy.  Neurological: She is alert and oriented to person, place, and time. No cranial nerve deficit. Gait normal. Coordination normal.  Skin: Skin is warm and dry. No rash noted. She is not diaphoretic. No erythema.  Psychiatric: Mood, memory, affect and  judgment normal.  Nursing note and vitals reviewed.   LABORATORY DATA:  I have reviewed the data as listed Lab Results  Component Value Date   WBC 6.4 04/01/2016   HGB 10.8 (L) 04/01/2016   HCT 33.9 (L) 04/01/2016   MCV 85.4 04/01/2016   PLT 245 04/01/2016   CMP     Component Value Date/Time   NA 137 04/01/2016 1636   K 4.0 04/01/2016 1636   CL 109 04/01/2016 1636   CO2 22 04/01/2016 1636   GLUCOSE 91 04/01/2016 1636   BUN 24 (H) 04/01/2016 1636   CREATININE 1.60 (H) 04/01/2016 1636   CALCIUM 9.8 04/01/2016 1636   PROT 8.6 (H) 04/01/2016 1636   ALBUMIN 3.9 04/01/2016 1636   AST 17 04/01/2016 1636   ALT 16 04/01/2016 1636   ALKPHOS 66 04/01/2016 1636   BILITOT 0.4 04/01/2016 1636   GFRNONAA 33 (L) 04/01/2016 1636   GFRAA 38 (L) 04/01/2016 1636     RADIOGRAPHIC STUDIES: I have personally reviewed the radiological images as listed and agreed with the findings in the report.  Study Result   CLINICAL DATA:  Subsequent encounter for breast cancer with chronic nonproductive cough.  EXAM: CT CHEST WITHOUT CONTRAST  TECHNIQUE: Multidetector CT imaging of the chest was performed following the standard protocol without IV contrast.  COMPARISON:  None.  FINDINGS: Mediastinum / Lymph Nodes: 7 mm right axillary lymph node is upper normal for size. No left axillary lymphadenopathy although scattered small axillary and subpectoral lymph nodes are evident on the left. Thyroid gland is unremarkable. No mediastinal or hilar lymphadenopathy. Heart size is normal. Trace pericardial effusion noted. Coronary artery calcification is noted.  Lungs / Pleura: Moderate changes of paraseptal and centrilobular emphysema noted bilaterally. Peripheral hazy opacity in the right middle and lower lobes and probably atelectatic given its relatively linear non focal configuration. Peripheral non focal infection/inflammation considered less likely. Left lung is clear. No pleural  effusion.  MSK / Soft Tissues: Bone windows reveal no worrisome lytic or sclerotic osseous lesions.  Upper Abdomen: Tiny calcified  gallstones noted. No adrenal nodule or mass. Cortical scarring noted in the incompletely visualized right kidney.  IMPRESSION: 1. Changes of emphysema bilaterally. 2. Peripheral interstitial and alveolar opacity in the right middle and lower lobes is a fairly linear configuration suggesting compressive atelectatic change. Infection/inflammation considered less likely. 3. Cholelithiasis.   Electronically Signed   By: Misty Stanley M.D.   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 Stage III CKD Dry mouth Anemia Oral candidiasis Sjogrens  She will continue her aromasin. She will continue her calcium and vitamin D. She is up to date on mammography. I still don't have a copy of her DEXA, will request again.  Aromasin was refilled today.   She has a mild anemia which I suspect is secondary to her CKD. We can observe this for now. Labs were not repeated today.   She has seen Gavin Pound MD for consultation and has been diagnosed with sjogrens.   She will continue to follow with Dr. Woody Seller for her primary care needs.   Follow up with patient in 3 months.   MEDICATIONS PRESCRIBED THIS ENCOUNTER: Meds ordered this encounter  Medications  . exemestane (AROMASIN) 25 MG tablet    Sig: Take 1 tablet (25 mg total) by mouth as directed.    Dispense:  30 tablet    Refill:  3   All questions were answered. The patient knows to call the clinic with any problems, questions or concerns.  This document serves as a record of services personally performed by Ancil Linsey, MD. It was created on her behalf by Martinique Casey, a trained medical scribe. The creation of this record is based on the scribe's personal  observations and the provider's statements to them. This document has been checked and approved by the attending provider.  I have reviewed the above documentation for accuracy and completeness and I agree with the above.  This note was electronically signed.   Molli Hazard, MD  07/04/2016 5:14 PM

## 2016-07-04 ENCOUNTER — Encounter (HOSPITAL_COMMUNITY): Payer: Self-pay | Admitting: Hematology & Oncology

## 2016-09-02 ENCOUNTER — Encounter (HOSPITAL_COMMUNITY): Payer: Self-pay | Admitting: Psychiatry

## 2016-09-02 ENCOUNTER — Ambulatory Visit (INDEPENDENT_AMBULATORY_CARE_PROVIDER_SITE_OTHER): Payer: Medicare HMO | Admitting: Psychiatry

## 2016-09-02 VITALS — BP 145/78 | HR 84 | Ht 60.0 in | Wt 148.8 lb

## 2016-09-02 DIAGNOSIS — F331 Major depressive disorder, recurrent, moderate: Secondary | ICD-10-CM | POA: Diagnosis not present

## 2016-09-02 DIAGNOSIS — Z818 Family history of other mental and behavioral disorders: Secondary | ICD-10-CM

## 2016-09-02 DIAGNOSIS — Z811 Family history of alcohol abuse and dependence: Secondary | ICD-10-CM

## 2016-09-02 MED ORDER — LAMOTRIGINE 100 MG PO TABS: 100.0000 mg | ORAL_TABLET | Freq: Every day | ORAL | 2 refills | Status: DC

## 2016-09-02 MED ORDER — DULOXETINE HCL 60 MG PO CPEP: 60.0000 mg | ORAL_CAPSULE | Freq: Two times a day (BID) | ORAL | 2 refills | Status: DC

## 2016-09-02 MED ORDER — OLANZAPINE 5 MG PO TABS: 5.0000 mg | ORAL_TABLET | Freq: Every day | ORAL | 2 refills | Status: DC

## 2016-09-02 MED ORDER — ALPRAZOLAM 0.5 MG PO TABS: 0.5000 mg | ORAL_TABLET | Freq: Two times a day (BID) | ORAL | 2 refills | Status: DC | PRN

## 2016-09-02 NOTE — Progress Notes (Signed)
Patient ID: Caitlin [name], female   DOB: [birth year], 66 y.o.   MRN: [id number] Patient ID: Caitlin [name], female   DOB: [birth year], 66 y.o.   MRN: [id number] Patient ID: Caitlin [name], female   DOB: August 01, 1950, 66 y.o.   MRN: [id number] Patient ID: Caitlin [name], female   DOB: [birth year], 66 y.o.   MRN: [id number] Patient ID: Caitlin [name], female   DOB: 04-04-1951, 66 y.o.   MRN: [id number] Patient ID: Caitlin [name], female   DOB: November 26, 1950, 66 y.o.   MRN: [id number] Patient ID: Caitlin [name], female   DOB: [birth year], 66 y.o.   MRN: [id number] Patient ID: Caitlin [name], female   DOB: 11/17/1950, 66 y.o.   MRN: [id number] Patient ID: SMT. LODER, female   DOB: 03/14/1951, 66 y.o.   MRN: [id number] Patient ID: Caitlin [name], female   DOB: 31-Mar-1951, 66 y.o.   MRN: [id number] Patient ID: Caitlin [name], female   DOB: 1950/11/05, 66 y.o.   MRN: [id number] Patient ID: Caitlin [name], female   DOB: [birth year], 66 y.o.   MRN: [id number]  Psychiatric Assessment Adult  Patient Identification:  Caitlin [name] Date of Evaluation:  09/02/2016 Chief Complaint: "Nothing interests me"  History of Chief Complaint:  The patient has a history of depression and anxiety dating back to the late 90s  Chief Complaint  Patient presents with  . Depression  . Anxiety  . Follow-up    Depression         Associated symptoms include no fatigue and no headaches.  Past medical history includes anxiety.   Anxiety  Patient reports no nervous/anxious behavior.     this patient is a 66 year old married white female who lives with her husband in [place]. She has 4 children and 6 grandchildren. She worked in a [employer] until they let her go in 2005. She is self-referred.  The patient states that her depression started in the late 90s. She had to have an emergency colostomy due to diverticulosis. She had complications from the surgery and had to have a hernia repair and several other surgeries on her abdomen. She is left with a huge scar which lowered  her self-confidence and self-esteem. She also suffers from migraine headaches.  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 Since then she's really gone downhill. She's become increasingly depressed and anxious. She's been seen at [facility] since 2009 but feels like the most recent psychiatrist does not listen to her and changed all the medications that were helpful. Currently she has lots of headaches, she's tired all the time and doesn't leave the house. She feels like she is a burden to everyone around her and has passive suicidal ideation but no plan. She sleeps okay her appetite has diminished.  In 2011 she was admitted to [facility] because she was depressed and psychotic. She thinks this was a reaction to a medication that she got from migraine headache. She doesn't recall the name of the medicine. She was not hospitalized since then has had little therapy  Patient returns after  3 months. She is with her husband. She states her mood is fairly good. She was diagnosed with Sjgren syndrome a few months ago and has a follow-up with her rheumatologist in may. She is on no medications for this disorder. She asked about trying rexulti because she said on TV but she is already on olanzapine and her husband thinks  this is really helped her mood and she cannot really be on both. She still stays somewhat drowsy during the day but overall states that her mood is stable and doesn't really want to change anything Review of Systems  Constitutional: Negative for fatigue.  Neurological: Negative for headaches.  Psychiatric/Behavioral: Positive for depression. The patient is not nervous/anxious.    chronic migraine headaches Physical Exam not done  Depressive Symptoms: depressed mood, anhedonia, psychomotor retardation, fatigue, feelings of worthlessness/guilt, difficulty concentrating, hopelessness, impaired memory, suicidal thoughts without plan, anxiety, loss  of energy/fatigue, weight loss,  (Hypo) Manic Symptoms:   Elevated Mood:  No Irritable Mood:  No Grandiosity:  No Distractibility:  No Labiality of Mood:  No Delusions:  No Hallucinations:  No Impulsivity:  No Sexually Inappropriate Behavior:  No Financial Extravagance:  No Flight of Ideas:  No  Anxiety Symptoms: Excessive Worry:  yes Panic Symptoms:  Yes Agoraphobia:  Yes Obsessive Compulsive: Yes  Symptoms: None, Specific Phobias:  No Social Anxiety:  Yes  Psychotic Symptoms:  Hallucinations: No None Delusions:  No Paranoia:  No   Ideas of Reference:  No  PTSD Symptoms: Ever had a traumatic exposure:  No Had a traumatic exposure in the last month:  No Re-experiencing: No None Hypervigilance:  No Hyperarousal: No None Avoidance: Yes Foreshortened Future  Traumatic Brain Injury: No   Past Psychiatric History: Diagnosis: Maj. depression, generalized anxiety disorder   Hospitalizations: Once in 2011   Outpatient Care: At [facility] since 2009   Substance Abuse Care: n/a  Self-Mutilation: None   Suicidal Attempts: None   Violent Behaviors: None    Past Medical History: History of colon surgery, incisional hernia repair right kidney tumor removed in March 2014  Past Medical History:  Diagnosis Date  . Anemia many yrs ago   not on any meds  . Anxiety    takes Xanax daily as needed  . Cataracts, bilateral    immature  . Depression    takes Cymbalta daily  . Diverticulitis   . GERD (gastroesophageal reflux disease)    takes Pantoprazole daily as needed  . Headache(784.0)    takes Topamax nightly;last migraine has been a while  . History of blood transfusion    no abnormal reaction noted  . History of bronchitis around 2005  . History of kidney stones   . History of shingles   . Hyperlipidemia    takes Atorvastatin daily  . Hypertension    takes Losaratn daily  . Insomnia    takes Zyprexa nightly  . Pneumonia around 2005   History of Loss of  Consciousness:  No Seizure History:  No Cardiac History:  No Allergies:see below   Allergies  Allergen Reactions  . Codeine     Nausea/vomiting   Current Medications:  Current Outpatient Prescriptions  Medication Sig Dispense Refill  . ALPRAZolam (XANAX) 0.5 MG tablet Take 1 tablet (0.5 mg total) by mouth 2 (two) times daily as needed for anxiety. 60 tablet 2  . atorvastatin (LIPITOR) 10 MG tablet Take 10 mg by mouth every morning.     . DULoxetine (CYMBALTA) 60 MG capsule Take 1 capsule (60 mg total) by mouth 2 (two) times daily. 60 capsule 2  . exemestane (AROMASIN) 25 MG tablet Take 1 tablet (25 mg total) by mouth as directed. 30 tablet 3  . lamoTRIgine (LAMICTAL) 100 MG tablet Take 1 tablet (100 mg total) by mouth at bedtime. 30 tablet 2  . OLANZapine (ZYPREXA) 5 MG tablet Take 1  tablet (5 mg total) by mouth at bedtime. 30 tablet 2  . pantoprazole (PROTONIX) 40 MG tablet Take 40 mg by mouth daily as needed (for acid reflux).     . topiramate (TOPAMAX) 100 MG tablet Take 100 mg by mouth daily.     Marland Kitchen topiramate (TOPAMAX) 50 MG tablet Take 50 mg by mouth daily.    . vitamin B-12 (CYANOCOBALAMIN) 1000 MCG tablet Take 1,000 mcg by mouth daily.      No current facility-administered medications for this visit.     Previous Psychotropic Medications:  Medication Dose  Clonazepam 0.5 mg bid  Cymbalta  60 mg qhs  Lamictal 100 mg qhs               Substance Abuse History in the last 12 months:none                                                                                                   Medical Consequences of Substance Abuse:n/a  Legal Consequences of Substance Abuse: n/a  Family Consequences of Substance Abuse: n/a  Blackouts:  No DT's:  No Withdrawal Symptoms:  No None  Social History: Current Place of Residence: [place], [place] Place of [place] Family Members: Husband 4 children 6 grandchildren Marital Status:  Married Children: see  above  Sons: See above  Daughters: See above Relationships: See above Education:  HS Graduate Educational Problems/Performance: Religious Beliefs/Practices: Christian History of Abuse: none Pensions consultant; former Solicitor History:  None. Legal History:none Hobbies/Interests: Used to love ceramics  Family History:   Family History  Problem Relation Age of Onset  . Depression Mother   . Alcohol abuse Father     Mental Status Examination/Evaluation: Objective:  Appearance: Well Groomed  Engineer, water::  Fair  Speech: Soft,  Volume:  soft  Mood: good  Affect Fairly bright   Thought Process:  Coherent  Orientation:  Full (Time, Place, and Person)  Thought Content:  Negative  Suicidal Thoughts:  no  Homicidal Thoughts:  No  Judgement:  Good   Insight:  Good   Psychomotor Activity:  Decreased  Akathisia:  No  Handed:  Right  AIMS (if indicated):  n/a  Assets:  Communication Skills Desire for Improvement Intimacy Social Support Talents/Skills    Laboratory/X-Ray Psychological Evaluation(s)  We'll have these sent from her primary Dr.   none   Assessment: AXIS I Maj. depression, recurrent severe, generalized anxiety disorder  AXIS II Deferred  AXIS III Past Medical History:  Diagnosis Date  . Anemia many yrs ago   not on any meds  . Anxiety    takes Xanax daily as needed  . Cataracts, bilateral    immature  . Depression    takes Cymbalta daily  . Diverticulitis   . GERD (gastroesophageal reflux disease)    takes Pantoprazole daily as needed  . Headache(784.0)    takes Topamax nightly;last migraine has been a while  . History of blood transfusion    no abnormal reaction noted  . History of bronchitis around 2005  . History of kidney stones   .  History of shingles   . Hyperlipidemia    takes Atorvastatin daily  . Hypertension    takes Losaratn daily  . Insomnia    takes Zyprexa nightly  . Pneumonia around 2005   migraine  headache, history of colon resection and hernia repair, history of right kidney tumor removal   AXIS IV minimal  AXIS V 41-50 serious symptoms   Treatment Plan/Recommendations:  Plan of Care: Medications and psychotherapy   Laboratory:    Psychotherapy: She tells me she really doesn't feel comfortable doing the counseling   Medications: She'll continue Cymbalta for depression, Zyprexa and Lamictal for mood stabilization and Xanax for anxiety at 0.5 mg twice a day   Routine PRN Medications:  No  Consultations:none  Safety Concerns: She and her husband have been instructed to call if her depression or suicidal ideation worsens.   Other: she'll return in 3 months     [name], MD 3/13/20182:09 PM     Patient ID: [name], female   DOB: May 05, 1951, 66 y.o.   MRN: [id number]

## 2016-10-02 ENCOUNTER — Other Ambulatory Visit (HOSPITAL_COMMUNITY): Payer: Self-pay | Admitting: Hematology & Oncology

## 2016-10-02 DIAGNOSIS — Z170 Estrogen receptor positive status [ER+]: Principal | ICD-10-CM

## 2016-10-02 DIAGNOSIS — C50112 Malignant neoplasm of central portion of left female breast: Secondary | ICD-10-CM

## 2016-10-29 ENCOUNTER — Encounter (HOSPITAL_COMMUNITY): Payer: Medicare HMO | Attending: Oncology | Admitting: Oncology

## 2016-10-29 ENCOUNTER — Encounter (HOSPITAL_COMMUNITY): Payer: Self-pay | Admitting: Oncology

## 2016-10-29 VITALS — BP 145/76 | HR 94 | Temp 97.9°F | Resp 16 | Wt 149.8 lb

## 2016-10-29 DIAGNOSIS — N183 Chronic kidney disease, stage 3 (moderate): Secondary | ICD-10-CM | POA: Diagnosis not present

## 2016-10-29 DIAGNOSIS — C50112 Malignant neoplasm of central portion of left female breast: Secondary | ICD-10-CM | POA: Diagnosis not present

## 2016-10-29 DIAGNOSIS — Z170 Estrogen receptor positive status [ER+]: Secondary | ICD-10-CM | POA: Diagnosis not present

## 2016-10-29 MED ORDER — EXEMESTANE 25 MG PO TABS: 25.0000 mg | ORAL_TABLET | Freq: Every day | ORAL | 2 refills | Status: DC

## 2016-10-29 NOTE — Progress Notes (Signed)
[facility]  PROGRESS NOTE  Patient Care Team: Glenda Chroman, MD as PCP - General (Internal Medicine)  CHIEF COMPLAINTS/PURPOSE OF CONSULTATION:    Malignant neoplasm of central portion of left breast in female, estrogen receptor positive ([facility])   02/10/2014 Surgery    Left modified radical mastectomy with sentinel LN X 1      02/10/2014 Pathology Results    6.5 cm IDC, ER+ 100%, PR + 96%, Ki 67 20% HER 2 -, grade II, LVI +, negative margins 1/1 negative sentinel nodes      10/11/2014 -  Anti-estrogen oral therapy    Exemestane 25 mg daily       HISTORY OF PRESENTING ILLNESS:  Caitlin [name] 66 y.o. female is here for ongoing follow-up of a  left breast cancer ER+/PR+/HER2-. 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 Patient declined radiation therapy and did not see a radiation therapist until eight months after her surgery. She is on exemestane. Of note, initial biopsy showed DCIS. Patient was followed by Dr. Tressie Stalker at [facility], secondary to its closure she is now followed here.   Patient previously had renal impairment. She is seeing a urologist at [facility]. She has spoken with Dr. Tressie Stalker about a nephrology referral but is not interested at this time.   PCP is Dr. Woody Seller.  Today, she is accompanied by her husband. The patient reports she is not experiencing any problems with Aromasin; she is taking this regularly. She reports she has a mammogram in June coming up soon, but is not sure if it is scheduled. She reports her most recent DEXA scan was last year and it was normal.  She denies chest pain, shortness of breath, or abdominal pain. She requests a refill of her Aromasin 25 mg. Overall the patient is without complaint.   MEDICAL HISTORY:  Past Medical History:  Diagnosis Date  . Anemia many yrs ago   not on any meds  . Anxiety    takes Xanax daily as needed  . Cataracts, bilateral    immature  . Depression    takes  Cymbalta daily  . Diverticulitis   . GERD (gastroesophageal reflux disease)    takes Pantoprazole daily as needed  . Headache(784.0)    takes Topamax nightly;last migraine has been a while  . History of blood transfusion    no abnormal reaction noted  . History of bronchitis around 2005  . History of kidney stones   . History of shingles   . Hyperlipidemia    takes Atorvastatin daily  . Hypertension    takes Losaratn daily  . Insomnia    takes Zyprexa nightly  . Pneumonia around 2005    SURGICAL HISTORY: Past Surgical History:  Procedure Laterality Date  . BREAST RECONSTRUCTION WITH PLACEMENT OF TISSUE EXPANDER AND FLEX HD (ACELLULAR HYDRATED DERMIS) Left 02/10/2014   Procedure: LEFT BREAST RECONSTRUCTION WITH PLACEMENT OF TISSUE EXPANDER AND  FLEX HD (ACELLULAR HYDRATED DERMIS);  Surgeon: Crissie Reese, MD;  Location: [facility];  Service: Plastics;  Laterality: Left;  . COLON SURGERY     d/t diverticulitis  . colostomy take down    . ESOPHAGOGASTRODUODENOSCOPY    . HERNIA REPAIR  2014  . INCISIONAL HERNIA REPAIR    . R kidney tumor removed    . SIMPLE MASTECTOMY WITH AXILLARY SENTINEL NODE BIOPSY Left 02/10/2014   Procedure: LEFT TOTAL MASTECTOMY WITH AXILLARY SENTINEL NODE BIOPSY;  Surgeon: Edward Jolly,  MD;  Location: [facility];  Service: General;  Laterality: Left;  . TONSILLECTOMY    . TUBAL LIGATION      SOCIAL HISTORY: Social History   Social History  . Marital status: Married    Spouse name: N/A  . Number of children: N/A  . Years of education: N/A   Occupational History  . Not on file.   Social History Main Topics  . Smoking status: Former Research scientist (life sciences)  . Smokeless tobacco: Never Used     Comment: quit smoking almost 3 yrs ago  . Alcohol use No  . Drug use: No  . Sexual activity: Not Currently    Birth control/ protection: Post-menopausal   Other Topics Concern  . Not on file   Social History Narrative  . No narrative on file   Patient married 50 years.  4 children. 7 grandchildren. 1 great grandchild. Former smoker; quit 4-5 years ago She enjoys ceramics No longer has pets  FAMILY HISTORY: Family History  Problem Relation Age of Onset  . Depression Mother   . Alcohol abuse Father   Both parents deceased; mother died in her 41s; father died 63 (suicide) Sibling passed from asthma No family history of breast cancer.  ALLERGIES:  is allergic to codeine.  MEDICATIONS:  Current Outpatient Prescriptions  Medication Sig Dispense Refill  . ALPRAZolam (XANAX) 0.5 MG tablet Take 1 tablet (0.5 mg total) by mouth 2 (two) times daily as needed for anxiety. 60 tablet 2  . atorvastatin (LIPITOR) 10 MG tablet Take 10 mg by mouth every morning.     . DULoxetine (CYMBALTA) 60 MG capsule Take 1 capsule (60 mg total) by mouth 2 (two) times daily. 60 capsule 2  . exemestane (AROMASIN) 25 MG tablet TAKE ONE TABLET BY MOUTH AS DIRECTED. 30 tablet 5  . lamoTRIgine (LAMICTAL) 100 MG tablet Take 1 tablet (100 mg total) by mouth at bedtime. 30 tablet 2  . OLANZapine (ZYPREXA) 5 MG tablet Take 1 tablet (5 mg total) by mouth at bedtime. 30 tablet 2  . pantoprazole (PROTONIX) 40 MG tablet Take 40 mg by mouth daily as needed (for acid reflux).     . topiramate (TOPAMAX) 100 MG tablet Take 100 mg by mouth daily.     Marland Kitchen topiramate (TOPAMAX) 50 MG tablet Take 50 mg by mouth daily.    . vitamin B-12 (CYANOCOBALAMIN) 1000 MCG tablet Take 1,000 mcg by mouth daily.      No current facility-administered medications for this visit.     Review of Systems  Constitutional: Negative.  Negative for fever.  Eyes: Negative.   Respiratory: Negative for shortness of breath.   Cardiovascular: Negative.  Negative for chest pain.  Gastrointestinal: Negative.  Negative for abdominal pain.  Genitourinary: Negative.   Musculoskeletal: Negative.   Skin: Negative.   Neurological: Negative.   Endo/Heme/Allergies: Negative.   Psychiatric/Behavioral: Negative.   All other systems  reviewed and are negative. 14 point ROS was done and is otherwise as detailed above or in HPI   PHYSICAL EXAMINATION: ECOG PERFORMANCE STATUS: 1 - Symptomatic but completely ambulatory  Vitals:   10/29/16 1419  BP: (!) 145/76  Pulse: 94  Resp: 16  Temp: 97.9 F (36.6 C)    Physical Exam  Constitutional: She is oriented to person, place, and time and well-developed, well-nourished, and in no distress.  Pt was able to get on exam table without assistance. Wears glasses.   HENT:  Head: Normocephalic and atraumatic.  Mouth/Throat: Oropharynx is clear  and moist.  Eyes: Conjunctivae and EOM are normal. Pupils are equal, round, and reactive to light.  Neck: Normal range of motion. Neck supple.  Cardiovascular: Normal rate, regular rhythm and normal heart sounds.   Pulmonary/Chest: Effort normal and breath sounds normal. No respiratory distress. She has no wheezes. She has no rales. She exhibits no tenderness. Right breast exhibits no mass, no nipple discharge, no skin change and no tenderness. Left breast exhibits no mass, no skin change and no tenderness.    Abdominal: Soft. Bowel sounds are normal. She exhibits no distension and no mass. There is no tenderness. There is no rebound and no guarding.  Musculoskeletal: Normal range of motion. She exhibits no edema.  Neurological: She is alert and oriented to person, place, and time. No cranial nerve deficit. Gait normal. Coordination normal.  Skin: Skin is warm and dry. No rash noted. No erythema.  Psychiatric: Mood, memory, affect and judgment normal.  Nursing note and vitals reviewed.   LABORATORY DATA:  I have reviewed the data as listed Lab Results  Component Value Date   WBC 6.4 04/01/2016   HGB 10.8 (L) 04/01/2016   HCT 33.9 (L) 04/01/2016   MCV 85.4 04/01/2016   PLT 245 04/01/2016   CMP     Component Value Date/Time   NA 137 04/01/2016 1636   K 4.0 04/01/2016 1636   CL 109 04/01/2016 1636   CO2 22 04/01/2016 1636    GLUCOSE 91 04/01/2016 1636   BUN 24 (H) 04/01/2016 1636   CREATININE 1.60 (H) 04/01/2016 1636   CALCIUM 9.8 04/01/2016 1636   PROT 8.6 (H) 04/01/2016 1636   ALBUMIN 3.9 04/01/2016 1636   AST 17 04/01/2016 1636   ALT 16 04/01/2016 1636   ALKPHOS 66 04/01/2016 1636   BILITOT 0.4 04/01/2016 1636   GFRNONAA 33 (L) 04/01/2016 1636   GFRAA 38 (L) 04/01/2016 1636     RADIOGRAPHIC STUDIES: I have personally reviewed the radiological images as listed and agreed with the findings in the report.  Study Result   CLINICAL DATA:  Subsequent encounter for breast cancer with chronic nonproductive cough.  EXAM: CT CHEST WITHOUT CONTRAST  TECHNIQUE: Multidetector CT imaging of the chest was performed following the standard protocol without IV contrast.  COMPARISON:  None.  FINDINGS: Mediastinum / Lymph Nodes: 7 mm right axillary lymph node is upper normal for size. No left axillary lymphadenopathy although scattered small axillary and subpectoral lymph nodes are evident on the left. Thyroid gland is unremarkable. No mediastinal or hilar lymphadenopathy. Heart size is normal. Trace pericardial effusion noted. Coronary artery calcification is noted.  Lungs / Pleura: Moderate changes of paraseptal and centrilobular emphysema noted bilaterally. Peripheral hazy opacity in the right middle and lower lobes and probably atelectatic given its relatively linear non focal configuration. Peripheral non focal infection/inflammation considered less likely. Left lung is clear. No pleural effusion.  MSK / Soft Tissues: Bone windows reveal no worrisome lytic or sclerotic osseous lesions.  Upper Abdomen: Tiny calcified gallstones noted. No adrenal nodule or mass. Cortical scarring noted in the incompletely visualized right kidney.  IMPRESSION: 1. Changes of emphysema bilaterally. 2. Peripheral interstitial and alveolar opacity in the right middle and lower lobes is a fairly linear  configuration suggesting compressive atelectatic change. Infection/inflammation considered less likely. 3. Cholelithiasis.   Electronically Signed   By: Misty Stanley M.D.   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 - Refill Aromasin today. Continue this medication as directed. - Routine screening right breast mammogram ordered for June 2018. - RTC for follow up in 6 months.  MEDICATIONS PRESCRIBED THIS ENCOUNTER: Meds ordered this encounter  Medications  . exemestane (AROMASIN) 25 MG tablet    Sig: Take 1 tablet (25 mg total) by mouth daily after breakfast.    Dispense:  90 tablet    Refill:  2    This prescription was filled on 10/02/2016. Any refills authorized will be placed on file.   All questions were answered. The patient knows to call the clinic with any problems, questions or concerns.  This document serves as a record of services personally performed by Twana First, MD. It was created on her behalf by Maryla Morrow, a trained medical scribe. The creation of this record is based on the scribe's personal observations and the provider's statements to them. This document has been checked and approved by the attending provider.  I have reviewed the above documentation for accuracy and completeness and I agree with the above.  This note was electronically signed by:  Twana First, MD 10/29/2016 2:06 PM

## 2016-10-29 NOTE — Patient Instructions (Signed)
[place] at [facility] Discharge Instructions  RECOMMENDATIONS MADE BY THE CONSULTANT AND ANY TEST RESULTS WILL BE SENT TO YOUR REFERRING PHYSICIAN.  You were seen today by Dr. Twana First Follow up in 6 months We will schedule your mammogram in 1 month   Thank you for choosing [place] at [facility] to provide your oncology and hematology care.  To afford each patient quality time with our provider, please arrive at least 15 minutes before your scheduled appointment time.    If you have a lab appointment with the [facility] please come in thru the  Main Entrance and check in at the main information desk  You need to re-schedule your appointment should you arrive 10 or more minutes late.  We strive to give you quality time with our providers, and arriving late affects you and other patients whose appointments are after yours.  Also, if you no show three or more times for appointments you may be dismissed from the clinic at the providers discretion.     Again, thank you for choosing [facility].  Our hope is that these requests will decrease the amount of time that you wait before being seen by our physicians.       _____________________________________________________________  Should you have questions after your visit to [facility], please contact our office at [phone] between the hours of 8:30 a.m. and 4:30 p.m.  Voicemails left after 4:30 p.m. will not be returned until the following business day.  For prescription refill requests, have your pharmacy contact our office.       Resources For Cancer Patients and their Caregivers ? American Cancer Society: Can assist with transportation, wigs, general needs, runs Look Good Feel Better.        (956)297-1416 ? Cancer Care: Provides financial assistance, online support groups, medication/co-pay assistance.  1-800-813-HOPE 702-256-2335) ? [place] Assists [place] cancer patients and their families through emotional , educational and financial support.  825-086-5639 ? [place] DSS Where to apply for food stamps, Medicaid and utility assistance. 920-395-0630 ? [facility]: Transportation to medical appointments. 801-052-5118 ? Social Security Administration: May apply for disability if have a Stage IV cancer. 716-515-4987 (786) 545-2855 ? [employer], Disability and Transit Services: Assists with nutrition, care and transit needs. [place] Support Programs: @10RELATIVEDAYS @ > Cancer Support Group  2nd Tuesday of the month 1pm-2pm, Journey Room  > Creative Journey  3rd Tuesday of the month 1130am-1pm, Journey Room  > Look Good Feel Better  1st Wednesday of the month 10am-12 noon, Journey Room (Call [place] to register [phone])

## 2016-11-27 ENCOUNTER — Other Ambulatory Visit (HOSPITAL_COMMUNITY): Payer: Self-pay | Admitting: Psychiatry

## 2016-12-01 ENCOUNTER — Other Ambulatory Visit (HOSPITAL_COMMUNITY): Payer: Self-pay | Admitting: Oncology

## 2016-12-01 ENCOUNTER — Ambulatory Visit (HOSPITAL_COMMUNITY): Payer: Self-pay

## 2016-12-01 ENCOUNTER — Ambulatory Visit (HOSPITAL_COMMUNITY)
Admission: RE | Admit: 2016-12-01 | Discharge: 2016-12-01 | Disposition: A | Discharge status: Home / Self Care | Payer: Medicare HMO | Source: Ambulatory Visit | Attending: Oncology | Admitting: Oncology

## 2016-12-01 DIAGNOSIS — Z1231 Encounter for screening mammogram for malignant neoplasm of breast: Secondary | ICD-10-CM | POA: Diagnosis present

## 2016-12-03 ENCOUNTER — Ambulatory Visit (INDEPENDENT_AMBULATORY_CARE_PROVIDER_SITE_OTHER): Payer: Medicare HMO | Admitting: Psychiatry

## 2016-12-03 ENCOUNTER — Encounter (HOSPITAL_COMMUNITY): Payer: Self-pay | Admitting: Psychiatry

## 2016-12-03 VITALS — BP 118/78 | HR 87 | Wt 150.2 lb

## 2016-12-03 DIAGNOSIS — F332 Major depressive disorder, recurrent severe without psychotic features: Secondary | ICD-10-CM

## 2016-12-03 DIAGNOSIS — F411 Generalized anxiety disorder: Secondary | ICD-10-CM | POA: Diagnosis not present

## 2016-12-03 DIAGNOSIS — Z79899 Other long term (current) drug therapy: Secondary | ICD-10-CM | POA: Diagnosis not present

## 2016-12-03 DIAGNOSIS — F331 Major depressive disorder, recurrent, moderate: Secondary | ICD-10-CM

## 2016-12-03 MED ORDER — DULOXETINE HCL 60 MG PO CPEP: 60.0000 mg | ORAL_CAPSULE | Freq: Two times a day (BID) | ORAL | 2 refills | Status: DC

## 2016-12-03 MED ORDER — OLANZAPINE 5 MG PO TABS: 5.0000 mg | ORAL_TABLET | Freq: Every day | ORAL | 2 refills | Status: DC

## 2016-12-03 MED ORDER — ALPRAZOLAM 0.5 MG PO TABS: 0.5000 mg | ORAL_TABLET | Freq: Two times a day (BID) | ORAL | 2 refills | Status: DC | PRN

## 2016-12-03 MED ORDER — LAMOTRIGINE 100 MG PO TABS: 100.0000 mg | ORAL_TABLET | Freq: Every day | ORAL | 2 refills | Status: DC

## 2016-12-03 NOTE — Progress Notes (Signed)
Patient ID: Caitlin [name], female   DOB: November 26, 1950, 66 y.o.   MRN: [id number] Patient ID: Caitlin [name], female   DOB: 08/27/1950, 66 y.o.   MRN: [id number] Patient ID: Caitlin [name], female   DOB: 05-31-1951, 66 y.o.   MRN: [id number] Patient ID: Caitlin [name], female   DOB: 07/14/1950, 66 y.o.   MRN: [id number] Patient ID: Caitlin [name], female   DOB: [birth year], 66 y.o.   MRN: [id number] Patient ID: Caitlin [name], female   DOB: [birth year], 66 y.o.   MRN: [id number] Patient ID: Caitlin [name], female   DOB: March 06, 1951, 66 y.o.   MRN: [id number] Patient ID: Caitlin [name], female   DOB: August 22, 1950, 66 y.o.   MRN: [id number] Patient ID: Caitlin [name], female   DOB: February 25, 1951, 66 y.o.   MRN: [id number] Patient ID: SELINE [name], female   DOB: 1951/01/21, 66 y.o.   MRN: [id number] Patient ID: Caitlin [name], female   DOB: Sep 30, 1950, 66 y.o.   MRN: [id number] Patient ID: Caitlin [name], female   DOB: 06-May-1951, 66 y.o.   MRN: [id number]  Psychiatric Assessment Adult  Patient Identification:  Caitlin [name] Date of Evaluation:  12/03/2016 Chief Complaint: "Nothing interests me"  History of Chief Complaint:  The patient has a history of depression and anxiety dating back to the late 90s  Chief Complaint  Patient presents with  . Follow-up  . Medication Refill  . Depression  . Anxiety    Depression         Associated symptoms include no fatigue and no headaches.  Past medical history includes anxiety.   Anxiety  Patient reports no nervous/anxious behavior.    Medication Refill  Pertinent negatives include no fatigue or headaches.   this patient is a 66 year old married white female who lives with her husband in [place]. She has 4 children and 6 grandchildren. She worked in a [employer] until they let her go in 2005. She is self-referred.  The patient states that her depression started in the late 90s. She had to have an emergency colostomy due to diverticulosis. She had complications from the surgery and had to have a  hernia repair and several other surgeries on her abdomen. She is left with a huge scar which lowered her self-confidence and self-esteem. She also suffers from migraine headaches.  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 Since then she's really gone downhill. She's become increasingly depressed and anxious. She's been seen at [facility] since 2009 but feels like the most recent psychiatrist does not listen to her and changed all the medications that were helpful. Currently she has lots of headaches, she's tired all the time and doesn't leave the house. She feels like she is a burden to everyone around her and has passive suicidal ideation but no plan. She sleeps okay her appetite has diminished.  In 2011 she was admitted to [facility] because she was depressed and psychotic. She thinks this was a reaction to a medication that she got from migraine headache. She doesn't recall the name of the medicine. She was not hospitalized since then has had little therapy  Patient returns after  3 months. She is with her husband. She states he is very worried. Her brother-in-law has lung cancer. Her daughter-in-law has breast cancer and just went through a double mastectomy and chemotherapy. She's very worried about both of them. She's cheerful today and can't seem to  let go of the worries even though she knows it won't help either of these people. Her husband tries really hard to get her involved in other activities but she just won't. She is drowsy through the day and we have moved most of her medication at bedtime but she takes Cymbalta in the morning and I suggested she take 1 with dinner and 1 at bedtime. Everything else is already at bedtime. Review of Systems  Constitutional: Negative for fatigue.  Neurological: Negative for headaches.  Psychiatric/Behavioral: Positive for depression. The patient is not nervous/anxious.    chronic migraine headaches Physical Exam not  done  Depressive Symptoms: depressed mood, anhedonia, psychomotor retardation, fatigue, feelings of worthlessness/guilt, difficulty concentrating, hopelessness, impaired memory, suicidal thoughts without plan, anxiety, loss of energy/fatigue, weight loss,  (Hypo) Manic Symptoms:   Elevated Mood:  No Irritable Mood:  No Grandiosity:  No Distractibility:  No Labiality of Mood:  No Delusions:  No Hallucinations:  No Impulsivity:  No Sexually Inappropriate Behavior:  No Financial Extravagance:  No Flight of Ideas:  No  Anxiety Symptoms: Excessive Worry:  yes Panic Symptoms:  Yes Agoraphobia:  Yes Obsessive Compulsive: Yes  Symptoms: None, Specific Phobias:  No Social Anxiety:  Yes  Psychotic Symptoms:  Hallucinations: No None Delusions:  No Paranoia:  No   Ideas of Reference:  No  PTSD Symptoms: Ever had a traumatic exposure:  No Had a traumatic exposure in the last month:  No Re-experiencing: No None Hypervigilance:  No Hyperarousal: No None Avoidance: Yes Foreshortened Future  Traumatic Brain Injury: No   Past Psychiatric History: Diagnosis: Maj. 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 Anemia many yrs ago   not on any meds  . Anxiety    takes Xanax daily as needed  . Cataracts, bilateral    immature  . Depression    takes Cymbalta daily  . Diverticulitis   . GERD (gastroesophageal reflux disease)    takes Pantoprazole daily as needed  . Headache(784.0)    takes Topamax nightly;last migraine has been a while  . History of blood transfusion    no abnormal reaction noted  . History of bronchitis around 2005  .  History of kidney stones   . History of shingles   . Hyperlipidemia    takes Atorvastatin daily  . Hypertension    takes Losaratn daily  . Insomnia    takes Zyprexa nightly  . Pneumonia around 2005   History of Loss of Consciousness:  No Seizure History:  No Cardiac History:  No Allergies:see below   Allergies  Allergen Reactions  . Codeine     Nausea/vomiting   Current Medications:  Current Outpatient Prescriptions  Medication Sig Dispense Refill  . ALPRAZolam (XANAX) 0.5 MG tablet Take 1 tablet (0.5 mg total) by mouth 2 (two) times daily as needed for anxiety. 60 tablet 2  . atorvastatin (LIPITOR) 10 MG tablet Take 10 mg by mouth every morning.     . DULoxetine (CYMBALTA) 60 MG capsule Take 1 capsule (60 mg total) by mouth 2 (two) times daily. 60 capsule 2  . exemestane (AROMASIN) 25 MG tablet Take 1 tablet (25 mg  total) by mouth daily after breakfast. 90 tablet 2  . lamoTRIgine (LAMICTAL) 100 MG tablet Take 1 tablet (100 mg total) by mouth at bedtime. 30 tablet 2  . OLANZapine (ZYPREXA) 5 MG tablet Take 1 tablet (5 mg total) by mouth at bedtime. 30 tablet 2  . pantoprazole (PROTONIX) 40 MG tablet Take 40 mg by mouth daily as needed (for acid reflux).     . topiramate (TOPAMAX) 100 MG tablet Take 100 mg by mouth daily.     Marland Kitchen topiramate (TOPAMAX) 50 MG tablet Take 50 mg by mouth daily.    . vitamin B-12 (CYANOCOBALAMIN) 1000 MCG tablet Take 1,000 mcg by mouth daily.      No current facility-administered medications for this visit.     Previous Psychotropic Medications:  Medication Dose  Clonazepam 0.5 mg bid  Cymbalta  60 mg qhs  Lamictal 100 mg qhs               Substance Abuse History in the last 12 months:none                                                                                                   Medical Consequences of Substance Abuse:n/a  Legal Consequences of Substance Abuse: n/a  Family Consequences of Substance Abuse:  n/a  Blackouts:  No DT's:  No Withdrawal Symptoms:  No None  Social History: Current Place of Residence: [place], [place] Family Members: Husband 4 children 6 grandchildren Marital Status:  Married Children: see above  Sons: See above  Daughters: See above Relationships: See above Education:  HS Graduate Educational Problems/Performance: Religious Beliefs/Practices: Christian History of Abuse: none Pensions consultant; former Solicitor History:  None. Legal History:none Hobbies/Interests: Used to love ceramics  Family History:   Family History  Problem Relation Age of Onset  . Depression Mother   . Alcohol abuse Father     Mental Status Examination/Evaluation: Objective:  Appearance: Well Groomed  Engineer, water::  Fair  Speech: Soft,  Volume:  soft  Mood:Depressed   Affect Dysphoric and tearful   Thought Process:  Coherent  Orientation:  Full (Time, Place, and Person)  Thought Content:  Negative  Suicidal Thoughts:  no  Homicidal Thoughts:  No  Judgement:  Good   Insight:  Good   Psychomotor Activity:  Decreased  Akathisia:  No  Handed:  Right  AIMS (if indicated):  n/a  Assets:  Communication Skills Desire for Improvement Intimacy Social Support Talents/Skills    Laboratory/X-Ray Psychological Evaluation(s)  We'll have these sent from her primary Dr.   none   Assessment: AXIS I Maj. depression, recurrent severe, generalized anxiety disorder  AXIS II Deferred  AXIS III Past Medical History:  Diagnosis Date  . Anemia many yrs ago   not on any meds  . Anxiety    takes Xanax daily as needed  . Cataracts, bilateral    immature  . Depression    takes Cymbalta daily  . Diverticulitis   . GERD (gastroesophageal reflux disease)    takes Pantoprazole daily as needed  . Headache(784.0)  takes Topamax nightly;last migraine has been a while  . History of blood transfusion    no abnormal reaction noted  . History of  bronchitis around 2005  . History of kidney stones   . History of shingles   . Hyperlipidemia    takes Atorvastatin daily  . Hypertension    takes Losaratn daily  . Insomnia    takes Zyprexa nightly  . Pneumonia around 2005   migraine headache, history of colon resection and hernia repair, history of right kidney tumor removal   AXIS IV minimal  AXIS V 41-50 serious symptoms   Treatment Plan/Recommendations:  Plan of Care: Medications and psychotherapy   Laboratory:    Psychotherapy: She tells me she really doesn't feel comfortable doing the counseling   Medications: She'll continue Cymbalta for depression, Zyprexa and Lamictal for mood stabilization and Xanax for anxiety at 0.5 mg twice a day . Will move her Cymbalta to 60 mg at dinner and 60 mg at bedtime   Routine PRN Medications:  No  Consultations:none  Safety Concerns: She and her husband have been instructed to call if her depression or suicidal ideation worsens.   Other: she'll return in 3 months     [name], MD 6/13/20182:04 PM     Patient ID: Caitlin [name], female   DOB: Sep 06, 1950, 66 y.o.   MRN: [id number]

## 2017-03-05 ENCOUNTER — Ambulatory Visit (INDEPENDENT_AMBULATORY_CARE_PROVIDER_SITE_OTHER): Payer: Medicare HMO | Admitting: Psychiatry

## 2017-03-05 ENCOUNTER — Encounter (HOSPITAL_COMMUNITY): Payer: Self-pay | Admitting: Psychiatry

## 2017-03-05 VITALS — BP 138/75 | HR 78 | Ht 60.0 in | Wt 152.0 lb

## 2017-03-05 DIAGNOSIS — F331 Major depressive disorder, recurrent, moderate: Secondary | ICD-10-CM | POA: Diagnosis not present

## 2017-03-05 DIAGNOSIS — F411 Generalized anxiety disorder: Secondary | ICD-10-CM | POA: Diagnosis not present

## 2017-03-05 DIAGNOSIS — Z6379 Other stressful life events affecting family and household: Secondary | ICD-10-CM | POA: Diagnosis not present

## 2017-03-05 DIAGNOSIS — Z933 Colostomy status: Secondary | ICD-10-CM | POA: Diagnosis not present

## 2017-03-05 DIAGNOSIS — Z811 Family history of alcohol abuse and dependence: Secondary | ICD-10-CM | POA: Diagnosis not present

## 2017-03-05 DIAGNOSIS — Z818 Family history of other mental and behavioral disorders: Secondary | ICD-10-CM | POA: Diagnosis not present

## 2017-03-05 MED ORDER — OLANZAPINE 5 MG PO TABS: 5.0000 mg | ORAL_TABLET | Freq: Every day | ORAL | 2 refills | Status: DC

## 2017-03-05 MED ORDER — DULOXETINE HCL 60 MG PO CPEP: 60.0000 mg | ORAL_CAPSULE | Freq: Two times a day (BID) | ORAL | 2 refills | Status: DC

## 2017-03-05 MED ORDER — ALPRAZOLAM 0.5 MG PO TABS: 0.5000 mg | ORAL_TABLET | Freq: Two times a day (BID) | ORAL | 2 refills | Status: DC | PRN

## 2017-03-05 MED ORDER — LAMOTRIGINE 100 MG PO TABS: 100.0000 mg | ORAL_TABLET | Freq: Every day | ORAL | 2 refills | Status: DC

## 2017-03-05 NOTE — Progress Notes (Signed)
Patient ID: Caitlin [name], female   DOB: August 06, 1950, 66 y.o.   MRN: [id number] Patient ID: Caitlin [name], female   DOB: 1950/07/07, 66 y.o.   MRN: [id number] Patient ID: Caitlin [name], female   DOB: 08-07-1950, 66 y.o.   MRN: [id number] Patient ID: Caitlin [name], female   DOB: 1951/03/19, 66 y.o.   MRN: [id number] Patient ID: Caitlin [name], female   DOB: 12/11/1950, 66 y.o.   MRN: [id number] Patient ID: Caitlin [name], female   DOB: 14-Jan-1951, 66 y.o.   MRN: [id number] Patient ID: Caitlin [name], female   DOB: 03/24/1951, 66 y.o.   MRN: [id number] Patient ID: Caitlin [name], female   DOB: Sep 15, 1950, 66 y.o.   MRN: [id number] Patient ID: Caitlin [name], female   DOB: 13-Jun-1951, 66 y.o.   MRN: [id number] Patient ID: Caitlin [name], female   DOB: 1951-05-07, 66 y.o.   MRN: [id number] Patient ID: Caitlin [name], female   DOB: [birth year], 66 y.o.   MRN: [id number] Patient ID: Caitlin [name], female   DOB: Jul 05, 1950, 66 y.o.   MRN: [id number]  Psychiatric Assessment Adult  Patient Identification:  Caitlin [name] Date of Evaluation:  03/05/2017 Chief Complaint: "Nothing interests me"  History of Chief Complaint:  The patient has a history of depression and anxiety dating back to the late 90s  Chief Complaint  Patient presents with  . Depression  . Anxiety  . Follow-up    Medication Refill  Pertinent negatives include no fatigue or headaches.  Depression         Associated symptoms include no fatigue and no headaches.  Past medical history includes anxiety.   Anxiety  Patient reports no nervous/anxious behavior.     this patient is a 66 year old married white female who lives with her husband in [place]. She has 4 children and 6 grandchildren. She worked in a [employer] until they let her go in 2005. She is self-referred.  The patient states that her depression started in the late 90s. She had to have an emergency colostomy due to diverticulosis. She had complications from the surgery and had to have a hernia repair and several  other surgeries on her abdomen. She is left with a huge scar which lowered her self-confidence and self-esteem. She also suffers from migraine headaches.  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 Since then she's really gone downhill. She's become increasingly depressed and anxious. She's been seen at [facility] since 2009 but feels like the most recent psychiatrist does not listen to her and changed all the medications that were helpful. Currently she has lots of headaches, she's tired all the time and doesn't leave the house. She feels like she is a burden to everyone around her and has passive suicidal ideation but no plan. She sleeps okay her appetite has diminished.  In 2011 she was admitted to [facility] because she was depressed and psychotic. She thinks this was a reaction to a medication that she got from migraine headache. She doesn't recall the name of the medicine. She was not hospitalized since then has had little therapy  Patient returns after  3 months. She is with her husband. She states that now she is upset with her 42 year old granddaughter. Apparently the granddaughter got married abruptly. She had hoped that she would have a church wedding and the whole family would be involved with the girl is pretty much stopped talking to  everyone in the family. She and her granddaughter used to be very close and this is really hurt her feelings. I explained that probably she just needs some time to herself with her new husband and eventually will return to talk to the family again. Overall however her mood is pretty good his sleep is variable she's not had any further psychotic symptoms. She still feels that her medications are helpful Review of Systems  Constitutional: Negative for fatigue.  Neurological: Negative for headaches.  Psychiatric/Behavioral: Positive for depression. The patient is not nervous/anxious.    chronic migraine headaches Physical Exam  not done  Depressive Symptoms: depressed mood, anhedonia, psychomotor retardation, fatigue, feelings of worthlessness/guilt, difficulty concentrating, hopelessness, impaired memory, suicidal thoughts without plan, anxiety, loss of energy/fatigue, weight loss,  (Hypo) Manic Symptoms:   Elevated Mood:  No Irritable Mood:  No Grandiosity:  No Distractibility:  No Labiality of Mood:  No Delusions:  No Hallucinations:  No Impulsivity:  No Sexually Inappropriate Behavior:  No Financial Extravagance:  No Flight of Ideas:  No  Anxiety Symptoms: Excessive Worry:  yes Panic Symptoms:  Yes Agoraphobia:  Yes Obsessive Compulsive: Yes  Symptoms: None, Specific Phobias:  No Social Anxiety:  Yes  Psychotic Symptoms:  Hallucinations: No None Delusions:  No Paranoia:  No   Ideas of Reference:  No  PTSD Symptoms: Ever had a traumatic exposure:  No Had a traumatic exposure in the last month:  No Re-experiencing: No None Hypervigilance:  No Hyperarousal: No None Avoidance: Yes Foreshortened Future  Traumatic Brain Injury: No   Past Psychiatric History: Diagnosis: Maj. 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 Anemia many yrs ago   not on any meds  . Anxiety    takes Xanax daily as needed  . Cataracts, bilateral    immature  . Depression    takes Cymbalta daily  . Diverticulitis   . GERD (gastroesophageal reflux disease)    takes Pantoprazole daily as needed  . Headache(784.0)    takes Topamax nightly;last migraine has been a while  . History of blood transfusion    no abnormal reaction noted  . History of bronchitis around 2005   . History of kidney stones   . History of shingles   . Hyperlipidemia    takes Atorvastatin daily  . Hypertension    takes Losaratn daily  . Insomnia    takes Zyprexa nightly  . Pneumonia around 2005   History of Loss of Consciousness:  No Seizure History:  No Cardiac History:  No Allergies:see below   Allergies  Allergen Reactions  . Codeine     Nausea/vomiting   Current Medications:  Current Outpatient Prescriptions  Medication Sig Dispense Refill  . ALPRAZolam (XANAX) 0.5 MG tablet Take 1 tablet (0.5 mg total) by mouth 2 (two) times daily as needed for anxiety. 60 tablet 2  . atorvastatin (LIPITOR) 10 MG tablet Take 10 mg by mouth every morning.     . DULoxetine (CYMBALTA) 60 MG capsule Take 1 capsule (60 mg total) by mouth 2 (two) times daily. 60 capsule 2  . exemestane (AROMASIN) 25 MG tablet Take 1 tablet (25 mg total) by  mouth daily after breakfast. 90 tablet 2  . lamoTRIgine (LAMICTAL) 100 MG tablet Take 1 tablet (100 mg total) by mouth at bedtime. 30 tablet 2  . OLANZapine (ZYPREXA) 5 MG tablet Take 1 tablet (5 mg total) by mouth at bedtime. 30 tablet 2  . pantoprazole (PROTONIX) 40 MG tablet Take 40 mg by mouth daily as needed (for acid reflux).     . topiramate (TOPAMAX) 100 MG tablet Take 100 mg by mouth daily.     Marland Kitchen topiramate (TOPAMAX) 50 MG tablet Take 50 mg by mouth daily.    . vitamin B-12 (CYANOCOBALAMIN) 1000 MCG tablet Take 1,000 mcg by mouth daily.      No current facility-administered medications for this visit.     Previous Psychotropic Medications:  Medication Dose  Clonazepam 0.5 mg bid  Cymbalta  60 mg qhs  Lamictal 100 mg qhs               Substance Abuse History in the last 12 months:none                                                                                                   Medical Consequences of Substance Abuse:n/a  Legal Consequences of Substance Abuse: n/a  Family Consequences of Substance Abuse:  n/a  Blackouts:  No DT's:  No Withdrawal Symptoms:  No None  Social History: Current Place of Residence: [place], [place] Place of [place] Family Members: Husband 4 children 6 grandchildren Marital Status:  Married Children: see above  Sons: See above  Daughters: See above Relationships: See above Education:  HS Graduate Educational Problems/Performance: Religious Beliefs/Practices: Christian History of Abuse: none Pensions consultant; former Solicitor History:  None. Legal History:none Hobbies/Interests: Used to love ceramics  Family History:   Family History  Problem Relation Age of Onset  . Depression Mother   . Alcohol abuse Father     Mental Status Examination/Evaluation: Objective:  Appearance: Well Groomed  Engineer, water::  Fair  Speech: Soft,  Volume:  soft  Mood:Dysphoric   Affect A little down but brighter than last time   Thought Process:  Coherent  Orientation:  Full (Time, Place, and Person)  Thought Content:  Negative  Suicidal Thoughts:  no  Homicidal Thoughts:  No  Judgement:  Good   Insight:  Good   Psychomotor Activity:  Decreased  Akathisia:  No  Handed:  Right  AIMS (if indicated):  n/a  Assets:  Communication Skills Desire for Improvement Intimacy Social Support Talents/Skills    Laboratory/X-Ray Psychological Evaluation(s)  We'll have these sent from her primary Dr.   none   Assessment: AXIS I Maj. depression, recurrent severe, generalized anxiety disorder  AXIS II Deferred  AXIS III Past Medical History:  Diagnosis Date  . Anemia many yrs ago   not on any meds  . Anxiety    takes Xanax daily as needed  . Cataracts, bilateral    immature  . Depression    takes Cymbalta daily  . Diverticulitis   . GERD (gastroesophageal reflux disease)    takes Pantoprazole daily as needed  .  Headache(784.0)    takes Topamax nightly;last migraine has been a while  . History of blood transfusion    no abnormal reaction  noted  . History of bronchitis around 2005  . History of kidney stones   . History of shingles   . Hyperlipidemia    takes Atorvastatin daily  . Hypertension    takes Losaratn daily  . Insomnia    takes Zyprexa nightly  . Pneumonia around 2005   migraine headache, history of colon resection and hernia repair, history of right kidney tumor removal   AXIS IV minimal  AXIS V 41-50 serious symptoms   Treatment Plan/Recommendations:  Plan of Care: Medications and psychotherapy   Laboratory:    Psychotherapy: She tells me she really doesn't feel comfortable doing the counseling   Medications: She'll continue Cymbalta for depression, Zyprexa and Lamictal for mood stabilization and Xanax for anxiety at 0.5 mg twice a day . She will continue Cymbalta to 60 mg at dinner and 60 mg at bedtime   Routine PRN Medications:  No  Consultations:none  Safety Concerns: She and her husband have been instructed to call if her depression or suicidal ideation worsens.   Other: she'll return in 3 months     [name], MD 9/13/20181:55 PM     Patient ID: Caitlin [name], female   DOB: 1951-06-03, 66 y.o.   MRN: [id number]

## 2017-05-01 ENCOUNTER — Encounter (HOSPITAL_COMMUNITY): Payer: Self-pay | Admitting: Oncology

## 2017-05-01 ENCOUNTER — Encounter (HOSPITAL_COMMUNITY): Payer: Medicare HMO | Attending: Oncology | Admitting: Oncology

## 2017-05-01 DIAGNOSIS — Z79811 Long term (current) use of aromatase inhibitors: Secondary | ICD-10-CM

## 2017-05-01 DIAGNOSIS — C50112 Malignant neoplasm of central portion of left female breast: Secondary | ICD-10-CM | POA: Diagnosis not present

## 2017-05-01 DIAGNOSIS — Z9012 Acquired absence of left breast and nipple: Secondary | ICD-10-CM | POA: Diagnosis not present

## 2017-05-01 DIAGNOSIS — N1831 Chronic kidney disease, stage 3a: Secondary | ICD-10-CM

## 2017-05-01 DIAGNOSIS — Z170 Estrogen receptor positive status [ER+]: Secondary | ICD-10-CM | POA: Diagnosis not present

## 2017-05-01 DIAGNOSIS — D631 Anemia in chronic kidney disease: Secondary | ICD-10-CM

## 2017-05-01 DIAGNOSIS — D0510 Intraductal carcinoma in situ of unspecified breast: Secondary | ICD-10-CM

## 2017-05-01 DIAGNOSIS — N183 Chronic kidney disease, stage 3 (moderate): Principal | ICD-10-CM

## 2017-05-01 MED ORDER — EXEMESTANE 25 MG PO TABS: 25.0000 mg | ORAL_TABLET | Freq: Every day | ORAL | 2 refills | Status: DC

## 2017-05-01 NOTE — Progress Notes (Signed)
[place]  PROGRESS NOTE  Patient Care Team: Glenda Chroman, MD as PCP - General (Internal Medicine)  CHIEF COMPLAINTS/PURPOSE OF CONSULTATION:    Malignant neoplasm of central portion of left female breast ([place])   02/10/2014 Surgery    Left modified radical mastectomy with sentinel LN X 1      02/10/2014 Pathology Results    6.5 cm IDC, ER+ 100%, PR + 96%, Ki 67 20% HER 2 -, grade II, LVI +, negative margins 1/1 negative sentinel nodes      10/11/2014 -  Anti-estrogen oral therapy    Exemestane 25 mg daily       HISTORY OF PRESENTING ILLNESS:  Caitlin [name] 66 y.o. female is here for ongoing follow-up of a  left breast cancer ER+/PR+/HER2-. 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 Patient declined radiation therapy and did not see a radiation therapist until eight months after her surgery. She is on exemestane. Of note, initial biopsy showed DCIS. Patient was followed by Dr. Tressie Stalker at [facility], secondary to its closure she is now followed here.   Patient previously had renal impairment. She is seeing a urologist at [facility]. She has spoken with Dr. Tressie Stalker about a nephrology referral but is not interested at this time.   PCP is Dr. Woody Seller.  Today, she is accompanied by her husband. The patient reports she is not experiencing any problems with Aromasin; she is taking this regularly. She reports she has a mammogram in June coming up soon, but is not sure if it is scheduled. She reports her most recent DEXA scan was last year and it was normal.  She denies chest pain, shortness of breath, or abdominal pain. She requests a refill of her Aromasin 25 mg. Overall the patient is without complaint. According to patient's he had a mammogram done recently in June of 2018 and has been reported to be negative.  I do not have record available will try to get report back for review. Patient is tolerating Aromasin very well taking calcium and  vitamin D.  No bony pains.  MEDICAL HISTORY:  Past Medical History:  Diagnosis Date  . Anemia many yrs ago   not on any meds  . Anxiety    takes Xanax daily as needed  . Cataracts, bilateral    immature  . Depression    takes Cymbalta daily  . Diverticulitis   . GERD (gastroesophageal reflux disease)    takes Pantoprazole daily as needed  . Headache(784.0)    takes Topamax nightly;last migraine has been a while  . History of blood transfusion    no abnormal reaction noted  . History of bronchitis around 2005  . History of kidney stones   . History of shingles   . Hyperlipidemia    takes Atorvastatin daily  . Hypertension    takes Losaratn daily  . Insomnia    takes Zyprexa nightly  . Pneumonia around 2005    SURGICAL HISTORY: Past Surgical History:  Procedure Laterality Date  . COLON SURGERY     d/t diverticulitis  . colostomy take down    . ESOPHAGOGASTRODUODENOSCOPY    . HERNIA REPAIR  2014  . INCISIONAL HERNIA REPAIR    . R kidney tumor removed    . TONSILLECTOMY    . TUBAL LIGATION      SOCIAL HISTORY: Social History   Socioeconomic History  . Marital status: Married    Spouse name:  Not on file  . Number of children: Not on file  . Years of education: Not on file  . Highest education level: Not on file  Social Needs  . Financial resource strain: Not on file  . Food insecurity - worry: Not on file  . Food insecurity - inability: Not on file  . Transportation needs - medical: Not on file  . Transportation needs - non-medical: Not on file  Occupational History  . Not on file  Tobacco Use  . Smoking status: Former Research scientist (life sciences)  . Smokeless tobacco: Never Used  . Tobacco comment: quit smoking almost 3 yrs ago  Substance and Sexual Activity  . Alcohol use: No  . Drug use: No  . Sexual activity: Not Currently    Birth control/protection: Post-menopausal  Other Topics Concern  . Not on file  Social History Narrative  . Not on file   Patient married  50 years. 4 children. 7 grandchildren. 1 great grandchild. Former smoker; quit 4-5 years ago She enjoys ceramics No longer has pets  FAMILY HISTORY: Family History  Problem Relation Age of Onset  . Depression Mother   . Alcohol abuse Father   Both parents deceased; mother died in her 39s; father died 56 (suicide) Sibling passed from asthma No family history of breast cancer.  ALLERGIES:  is allergic to codeine.  MEDICATIONS:  Current Outpatient Medications  Medication Sig Dispense Refill  . ALPRAZolam (XANAX) 0.5 MG tablet Take 1 tablet (0.5 mg total) by mouth 2 (two) times daily as needed for anxiety. 60 tablet 2  . atorvastatin (LIPITOR) 10 MG tablet Take 10 mg by mouth every morning.     . DULoxetine (CYMBALTA) 60 MG capsule Take 1 capsule (60 mg total) by mouth 2 (two) times daily. 60 capsule 2  . exemestane (AROMASIN) 25 MG tablet Take 1 tablet (25 mg total) by mouth daily after breakfast. 90 tablet 2  . lamoTRIgine (LAMICTAL) 100 MG tablet Take 1 tablet (100 mg total) by mouth at bedtime. 30 tablet 2  . OLANZapine (ZYPREXA) 5 MG tablet Take 1 tablet (5 mg total) by mouth at bedtime. 30 tablet 2  . pantoprazole (PROTONIX) 40 MG tablet Take 40 mg by mouth daily as needed (for acid reflux).     . topiramate (TOPAMAX) 100 MG tablet Take 100 mg by mouth daily.     Marland Kitchen topiramate (TOPAMAX) 50 MG tablet Take 50 mg by mouth daily.    . vitamin B-12 (CYANOCOBALAMIN) 1000 MCG tablet Take 1,000 mcg by mouth daily.      No current facility-administered medications for this visit.     Review of Systems  Constitutional: Negative.  Negative for fever.  Eyes: Negative.   Respiratory: Negative for shortness of breath.   Cardiovascular: Negative.  Negative for chest pain.  Gastrointestinal: Negative.  Negative for abdominal pain.  Genitourinary: Negative.   Musculoskeletal: Negative.   Skin: Negative.   Neurological: Negative.   Endo/Heme/Allergies: Negative.   Psychiatric/Behavioral:  Negative.   All other systems reviewed and are negative. 14 point ROS was done and is otherwise as detailed above or in HPI   PHYSICAL EXAMINATION: ECOG PERFORMANCE STATUS: 1 - Symptomatic but completely ambulatory  Vitals:   05/01/17 1450  BP: (P) 138/82  Pulse: (P) 92  Resp: (P) 16  Temp: (P) 98 F (36.7 C)  SpO2: (P) 97%    Physical Exam  Constitutional: She is oriented to person, place, and time and well-developed, well-nourished, and in no distress.  Pt was able to get on exam table without assistance. Wears glasses.   HENT:  Head: Normocephalic and atraumatic.  Mouth/Throat: Oropharynx is clear and moist.  Eyes: Conjunctivae and EOM are normal. Pupils are equal, round, and reactive to light.  Neck: Normal range of motion. Neck supple.  Cardiovascular: Normal rate, regular rhythm and normal heart sounds.  Pulmonary/Chest: Effort normal and breath sounds normal. No respiratory distress. She has no wheezes. She has no rales. She exhibits no tenderness. Right breast exhibits no mass, no nipple discharge, no skin change and no tenderness. Left breast exhibits no mass, no skin change and no tenderness.    Abdominal: Soft. Bowel sounds are normal. She exhibits no distension and no mass. There is no tenderness. There is no rebound and no guarding.  Musculoskeletal: Normal range of motion. She exhibits no edema.  Neurological: She is alert and oriented to person, place, and time. No cranial nerve deficit. Gait normal. Coordination normal.  Skin: Skin is warm and dry. No rash noted. No erythema.  Psychiatric: Mood, memory, affect and judgment normal.  Nursing note and vitals reviewed.   LABORATORY DATA:  I have reviewed the data as listed Lab Results  Component Value Date   WBC 6.4 04/01/2016   HGB 10.8 (L) 04/01/2016   HCT 33.9 (L) 04/01/2016   MCV 85.4 04/01/2016   PLT 245 04/01/2016   CMP     Component Value Date/Time   NA 137 04/01/2016 1636   K 4.0 04/01/2016  1636   CL 109 04/01/2016 1636   CO2 22 04/01/2016 1636   GLUCOSE 91 04/01/2016 1636   BUN 24 (H) 04/01/2016 1636   CREATININE 1.60 (H) 04/01/2016 1636   CALCIUM 9.8 04/01/2016 1636   PROT 8.6 (H) 04/01/2016 1636   ALBUMIN 3.9 04/01/2016 1636   AST 17 04/01/2016 1636   ALT 16 04/01/2016 1636   ALKPHOS 66 04/01/2016 1636   BILITOT 0.4 04/01/2016 1636   GFRNONAA 33 (L) 04/01/2016 1636   GFRAA 38 (L) 04/01/2016 1636     RADIOGRAPHIC STUDIES: I have personally reviewed the radiological images as listed and agreed with the findings in the report.  Study Result   CLINICAL DATA:  Subsequent encounter for breast cancer with chronic nonproductive cough.  EXAM: CT CHEST WITHOUT CONTRAST  TECHNIQUE: Multidetector CT imaging of the chest was performed following the standard protocol without IV contrast.  COMPARISON:  None.  FINDINGS: Mediastinum / Lymph Nodes: 7 mm right axillary lymph node is upper normal for size. No left axillary lymphadenopathy although scattered small axillary and subpectoral lymph nodes are evident on the left. Thyroid gland is unremarkable. No mediastinal or hilar lymphadenopathy. Heart size is normal. Trace pericardial effusion noted. Coronary artery calcification is noted.  Lungs / Pleura: Moderate changes of paraseptal and centrilobular emphysema noted bilaterally. Peripheral hazy opacity in the right middle and lower lobes and probably atelectatic given its relatively linear non focal configuration. Peripheral non focal infection/inflammation considered less likely. Left lung is clear. No pleural effusion.  MSK / Soft Tissues: Bone windows reveal no worrisome lytic or sclerotic osseous lesions.  Upper Abdomen: Tiny calcified gallstones noted. No adrenal nodule or mass. Cortical scarring noted in the incompletely visualized right kidney.  IMPRESSION: 1. Changes of emphysema bilaterally. 2. Peripheral interstitial and alveolar opacity in  the right middle and lower lobes is a fairly linear configuration suggesting compressive atelectatic change. Infection/inflammation considered less likely. 3. Cholelithiasis.   Electronically Signed   By: Misty Stanley  M.D.   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 - Refill Aromasin today. Continue this medication as directed. - Routine screening right breast mammogram ordered for June 2018. - RTC for follow up in 6 months. Mammogram was done in June of 2018 and reported to be negative Mammogram June of 2018 reported to be BI-RADS 1 Continue Aromasin for 6 more months with CBC and comprehensive panel Repeat blood count at present time by primary care physician   MEDICATIONS PRESCRIBED [place] All questions were answered. The patient knows to call the clinic with any problems, questions or concerns.  This document serves as a record of services personally performed by Twana First, MD. It was created on her behalf by Maryla Morrow, a trained medical scribe. The creation of this record is based on the scribe's personal observations and the provider's statements to them. This document has been checked and approved by the attending provider.  I have reviewed the above documentation for accuracy and completeness and I agree with the above.  This note was electronically signed by: Forest Gleason. MD

## 2017-05-01 NOTE — Patient Instructions (Signed)
[place] at [facility] Discharge Instructions  RECOMMENDATIONS MADE BY THE CONSULTANT AND ANY TEST RESULTS WILL BE SENT TO YOUR REFERRING PHYSICIAN.  Seen by Dr. Oliva Bustard Follow up in 6 months  Thank you for choosing [place] at [facility] to provide your oncology and hematology care.  To afford each patient quality time with our provider, please arrive at least 15 minutes before your scheduled appointment time.    If you have a lab appointment with the [facility] please come in thru the  Main Entrance and check in at the main information desk  You need to re-schedule your appointment should you arrive 10 or more minutes late.  We strive to give you quality time with our providers, and arriving late affects you and other patients whose appointments are after yours.  Also, if you no show three or more times for appointments you may be dismissed from the clinic at the providers discretion.     Again, thank you for choosing [facility].  Our hope is that these requests will decrease the amount of time that you wait before being seen by our physicians.       _____________________________________________________________  Should you have questions after your visit to [facility], please contact our office at [phone] between the hours of 8:30 a.m. and 4:30 p.m.  Voicemails left after 4:30 p.m. will not be returned until the following business day.  For prescription refill requests, have your pharmacy contact our office.       Resources For Cancer Patients and their Caregivers ? American Cancer Society: Can assist with transportation, wigs, general needs, runs Look Good Feel Better.        205-347-5218 ? Cancer Care: Provides financial assistance, online support groups, medication/co-pay assistance.  1-800-813-HOPE (702)705-0436) ? [facility] Assists [place] cancer patients and their  families through emotional , educational and financial support.  (517)553-0668 ? [place] DSS Where to apply for food stamps, Medicaid and utility assistance. 484-552-5314 ? [facility]: Transportation to medical appointments. (905)347-1439 ? Social Security Administration: May apply for disability if have a Stage IV cancer. (717)677-2860 815-866-0180 ? [employer], Disability and Transit Services: Assists with nutrition, care and transit needs. [facility] Support Programs: @10RELATIVEDAYS @ > Cancer Support Group  2nd Tuesday of the month 1pm-2pm, Journey Room  > Creative Journey  3rd Tuesday of the month 1130am-1pm, Journey Room  > Look Good Feel Better  1st Wednesday of the month 10am-12 noon, Journey Room (Call [place] to register [phone])

## 2017-05-28 ENCOUNTER — Other Ambulatory Visit (HOSPITAL_COMMUNITY): Payer: Self-pay | Admitting: Psychiatry

## 2017-06-04 ENCOUNTER — Encounter (HOSPITAL_COMMUNITY): Payer: Self-pay | Admitting: Psychiatry

## 2017-06-04 ENCOUNTER — Ambulatory Visit (HOSPITAL_COMMUNITY): Payer: Medicare HMO | Admitting: Psychiatry

## 2017-06-04 VITALS — BP 130/76 | HR 75 | Ht 60.0 in | Wt 152.0 lb

## 2017-06-04 DIAGNOSIS — Z87891 Personal history of nicotine dependence: Secondary | ICD-10-CM

## 2017-06-04 DIAGNOSIS — Z811 Family history of alcohol abuse and dependence: Secondary | ICD-10-CM | POA: Diagnosis not present

## 2017-06-04 DIAGNOSIS — Z818 Family history of other mental and behavioral disorders: Secondary | ICD-10-CM | POA: Diagnosis not present

## 2017-06-04 DIAGNOSIS — F331 Major depressive disorder, recurrent, moderate: Secondary | ICD-10-CM | POA: Diagnosis not present

## 2017-06-04 MED ORDER — ALPRAZOLAM 0.5 MG PO TABS: 0.5000 mg | ORAL_TABLET | Freq: Two times a day (BID) | ORAL | 2 refills | Status: DC | PRN

## 2017-06-04 MED ORDER — DULOXETINE HCL 60 MG PO CPEP: 60.0000 mg | ORAL_CAPSULE | Freq: Two times a day (BID) | ORAL | 2 refills | Status: DC

## 2017-06-04 MED ORDER — LAMOTRIGINE 100 MG PO TABS: 100.0000 mg | ORAL_TABLET | Freq: Every day | ORAL | 2 refills | Status: DC

## 2017-06-04 MED ORDER — OLANZAPINE 5 MG PO TABS: 5.0000 mg | ORAL_TABLET | Freq: Every day | ORAL | 2 refills | Status: DC

## 2017-06-04 NOTE — Progress Notes (Signed)
BH MD/PA/NP OP Progress Note  06/04/2017 2:28 PM Caitlin [name]  MRN:  [id number]  Chief Complaint:  Chief Complaint    Depression; Anxiety; Follow-up     HPI:  this patient is a 66 year old married white female who lives with her husband in [place]. She has 4 children and 6 grandchildren. She worked in a [employer] until they let her go in 2005. She is self-referred.  The patient states that her depression started in the late 90s. She had to have an emergency colostomy due to diverticulosis. She had complications from the surgery and had to have a hernia repair and several other surgeries on her abdomen. She is left with a huge scar which lowered her self-confidence and self-esteem. She also suffers from migraine headaches.  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 Since then she's really gone downhill. She's become increasingly depressed and anxious. She's been seen at [facility] since 2009 but feels like the most recent psychiatrist does not listen to her and changed all the medications that were helpful. Currently she has lots of headaches, she's tired all the time and doesn't leave the house. She feels like she is a burden to everyone around her and has passive suicidal ideation but no plan. She sleeps okay her appetite has diminished.  In 2011 she was admitted to [facility] because she was depressed and psychotic. She thinks this was a reaction to a medication that she got from migraine headache. She doesn't recall the name of the medicine. She was not hospitalized since then has had little therapy  The patient returns after 3 months with her husband.  Overall she is doing well.  However right now she has laryngitis and cannot say much.  She states that her mood is good and she has been enjoying her Christmas shopping she denies any psychotic symptoms and she is sleeping well.  Her energy is fairly good. Visit Diagnosis:    ICD-10-CM   1. Major  depressive disorder, recurrent episode, moderate (HCC) F33.1     Past Psychiatric History: One past psychiatric hospitalization, primarily outpatient treatment for depression  Past Medical History:  Past Medical History:  Diagnosis Date  . Anemia many yrs ago   not on any meds  . Anxiety    takes Xanax daily as needed  . Cataracts, bilateral    immature  . Depression    takes Cymbalta daily  . Diverticulitis   . GERD (gastroesophageal reflux disease)    takes Pantoprazole daily as needed  . Headache(784.0)    takes Topamax nightly;last migraine has been a while  . History of blood transfusion    no abnormal reaction noted  . History of bronchitis around 2005  . History of kidney stones   . History of shingles   . Hyperlipidemia    takes Atorvastatin daily  . Hypertension    takes Losaratn daily  . Insomnia    takes Zyprexa nightly  . Pneumonia around 2005    Past Surgical History:  Procedure Laterality Date  . BREAST RECONSTRUCTION WITH PLACEMENT OF TISSUE EXPANDER AND FLEX HD (ACELLULAR HYDRATED DERMIS) Left 02/10/2014   Procedure: LEFT BREAST RECONSTRUCTION WITH PLACEMENT OF TISSUE EXPANDER AND  FLEX HD (ACELLULAR HYDRATED DERMIS);  Surgeon: Crissie Reese, MD;  Location: [place];  Service: Plastics;  Laterality: Left;  . COLON SURGERY     d/t diverticulitis  . colostomy take down    . ESOPHAGOGASTRODUODENOSCOPY    .  HERNIA REPAIR  2014  . INCISIONAL HERNIA REPAIR    . R kidney tumor removed    . SIMPLE MASTECTOMY WITH AXILLARY SENTINEL NODE BIOPSY Left 02/10/2014   Procedure: LEFT TOTAL MASTECTOMY WITH AXILLARY SENTINEL NODE BIOPSY;  Surgeon: Edward Jolly, MD;  Location: [facility];  Service: General;  Laterality: Left;  . TONSILLECTOMY    . TUBAL LIGATION      Family Psychiatric History: See below  Family History:  Family History  Problem Relation Age of Onset  . Depression Mother   . Alcohol abuse Father     Social History:  Social History    Socioeconomic History  . Marital status: Married    Spouse name: None  . Number of children: None  . Years of education: None  . Highest education level: None  Social Needs  . Financial resource strain: None  . Food insecurity - worry: None  . Food insecurity - inability: None  . Transportation needs - medical: None  . Transportation needs - non-medical: None  Occupational History  . None  Tobacco Use  . Smoking status: Former Research scientist (life sciences)  . Smokeless tobacco: Never Used  . Tobacco comment: quit smoking almost 3 yrs ago  Substance and Sexual Activity  . Alcohol use: No  . Drug use: No  . Sexual activity: Not Currently    Birth control/protection: Post-menopausal  Other Topics Concern  . None  Social History Narrative  . None    Allergies:  Allergies  Allergen Reactions  . Codeine     Nausea/vomiting    Metabolic Disorder Labs: No results found for: HGBA1C, MPG No results found for: PROLACTIN No results found for: CHOL, TRIG, HDL, CHOLHDL, VLDL, LDLCALC No results found for: TSH  Therapeutic Level Labs: No results found for: LITHIUM No results found for: VALPROATE No components found for:  CBMZ  Current Medications: Current Outpatient Medications  Medication Sig Dispense Refill  . ALPRAZolam (XANAX) 0.5 MG tablet Take 1 tablet (0.5 mg total) by mouth 2 (two) times daily as needed for anxiety. 60 tablet 2  . atorvastatin (LIPITOR) 10 MG tablet Take 10 mg by mouth every morning.     . DULoxetine (CYMBALTA) 60 MG capsule Take 1 capsule (60 mg total) by mouth 2 (two) times daily. 60 capsule 2  . exemestane (AROMASIN) 25 MG tablet Take 1 tablet (25 mg total) daily after breakfast by mouth. 90 tablet 2  . lamoTRIgine (LAMICTAL) 100 MG tablet Take 1 tablet (100 mg total) by mouth at bedtime. 30 tablet 2  . OLANZapine (ZYPREXA) 5 MG tablet Take 1 tablet (5 mg total) by mouth at bedtime. 30 tablet 2  . pantoprazole (PROTONIX) 40 MG tablet Take 40 mg by mouth daily as  needed (for acid reflux).     . topiramate (TOPAMAX) 100 MG tablet Take 100 mg by mouth daily.     Marland Kitchen topiramate (TOPAMAX) 50 MG tablet Take 50 mg by mouth daily.    . vitamin B-12 (CYANOCOBALAMIN) 1000 MCG tablet Take 1,000 mcg by mouth daily.      No current facility-administered medications for this visit.      Musculoskeletal: Strength & Muscle Tone: within normal limits Gait & Station: normal Patient leans: N/A  Psychiatric Specialty Exam: Review of Systems  HENT: Positive for congestion and sore throat.   All other systems reviewed and are negative.   Blood pressure 130/76, pulse 75, height 5' (1.524 m), weight 152 lb (68.9 kg), SpO2 96 %.Body mass index  is 29.69 kg/m.  General Appearance: Casual and Fairly Groomed  Eye Contact:  Good  Speech:  Clear and Coherent  Volume:  Decreased  Mood:  Euthymic  Affect:  Congruent  Thought Process:  Goal Directed  Orientation:  Full (Time, Place, and Person)  Thought Content: WDL   Suicidal Thoughts:  No  Homicidal Thoughts:  No  Memory:  Immediate;   Good Recent;   Good Remote;   Good  Judgement:  Fair  Insight:  Fair  Psychomotor Activity:  Normal  Concentration:  Concentration: Good and Attention Span: Good  Recall:  Good  Fund of Knowledge: Good  Language: Good  Akathisia:  No  Handed:  Right  AIMS (if indicated): not done  Assets:  Communication Skills Desire for Improvement Resilience Social Support  ADL's:  Intact  Cognition: WNL  Sleep:  Good   Screenings:   Assessment and Plan: This patient is a 66 year old female with a history of depression and anxiety.  She is doing well on her current medication so she will continue Cymbalta 60 mg twice a day for depression, olanzapine 5 mg daily for augmentation, Lamictal 100 mg daily for mood stabilization and Xanax 0.5 mg twice a day as needed for anxiety.  She will return to see me in 3 months   [name], MD 06/04/2017, 2:28 PM

## 2017-08-24 ENCOUNTER — Other Ambulatory Visit (HOSPITAL_COMMUNITY): Payer: Self-pay | Admitting: Psychiatry

## 2017-09-02 ENCOUNTER — Encounter (HOSPITAL_COMMUNITY): Payer: Self-pay | Admitting: Psychiatry

## 2017-09-02 ENCOUNTER — Ambulatory Visit (HOSPITAL_COMMUNITY): Payer: Medicare HMO | Admitting: Psychiatry

## 2017-09-02 VITALS — BP 155/96 | HR 83 | Ht 60.0 in | Wt 155.0 lb

## 2017-09-02 DIAGNOSIS — F419 Anxiety disorder, unspecified: Secondary | ICD-10-CM | POA: Diagnosis not present

## 2017-09-02 DIAGNOSIS — R51 Headache: Secondary | ICD-10-CM

## 2017-09-02 DIAGNOSIS — Z818 Family history of other mental and behavioral disorders: Secondary | ICD-10-CM | POA: Diagnosis not present

## 2017-09-02 DIAGNOSIS — Z87891 Personal history of nicotine dependence: Secondary | ICD-10-CM

## 2017-09-02 DIAGNOSIS — F331 Major depressive disorder, recurrent, moderate: Secondary | ICD-10-CM

## 2017-09-02 DIAGNOSIS — Z811 Family history of alcohol abuse and dependence: Secondary | ICD-10-CM

## 2017-09-02 MED ORDER — DULOXETINE HCL 60 MG PO CPEP: 60.0000 mg | ORAL_CAPSULE | Freq: Two times a day (BID) | ORAL | 2 refills | Status: DC

## 2017-09-02 MED ORDER — LAMOTRIGINE 100 MG PO TABS: 100.0000 mg | ORAL_TABLET | Freq: Every day | ORAL | 2 refills | Status: DC

## 2017-09-02 MED ORDER — ALPRAZOLAM 0.5 MG PO TABS: 0.5000 mg | ORAL_TABLET | Freq: Two times a day (BID) | ORAL | 2 refills | Status: DC | PRN

## 2017-09-02 MED ORDER — OLANZAPINE 5 MG PO TABS: 5.0000 mg | ORAL_TABLET | Freq: Every day | ORAL | 2 refills | Status: DC

## 2017-09-02 NOTE — Progress Notes (Signed)
BH MD/PA/NP OP Progress Note  09/02/2017 2:43 PM Caitlin [name]  MRN:  [id number]  Chief Complaint:  Chief Complaint    Depression; Anxiety; Follow-up     HPI: s patient is a 67 year old married white female who lives with her husband in [place]. She has 4 children and 6 grandchildren. She worked in a [employer] until they let her go in 2005. She is self-referred.  The patient states that her depression started in the late 90s. She had to have an emergency colostomy due to diverticulosis. She had complications from the surgery and had to have a hernia repair and several other surgeries on her abdomen. She is left with a huge scar which lowered her self-confidence and self-esteem. She also suffers from migraine headaches.  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 Since then she's really gone downhill. She's become increasingly depressed and anxious. She's been seen at [facility] since 2009 but feels like the most recent psychiatrist does not listen to her and changed all the medications that were helpful. Currently she has lots of headaches, she's tired all the time and doesn't leave the house. She feels like she is a burden to everyone around her and has passive suicidal ideation but no plan. She sleeps okay her appetite has diminished.  In 2011 she was admitted to [facility] because she was depressed and psychotic. She thinks this was a reaction to a medication that she got from migraine headache. She doesn't recall the name of the medicine. She was not hospitalized since then has had little therapy  The patient returns after 3 months.  She is generally been doing pretty well.  She states somewhat drowsy during the day but the only thing that she takes in the morning that might be causing drowsiness is Xanax.  I told her to try to leave it off if she can but if not she can take it if she gets anxious.  Everything else she is taking primarily at bedtime.  She  still having trouble with frequent headaches although the frequency is reduced a little bit with the Topamax.  She has been sad about her brother-in-law dying recently but her mood has generally been stable and she denies being seriously depressed or having any psychotic symptoms.  She denies suicidal ideation Visit Diagnosis:    ICD-10-CM   1. Major depressive disorder, recurrent episode, moderate (HCC) F33.1     Past Psychiatric History: One past psychiatric hospitalization, primarily outpatient treatment for depression  Past Medical History:  Past Medical History:  Diagnosis Date  . Anemia many yrs ago   not on any meds  . Anxiety    takes Xanax daily as needed  . Cataracts, bilateral    immature  . Depression    takes Cymbalta daily  . Diverticulitis   . GERD (gastroesophageal reflux disease)    takes Pantoprazole daily as needed  . Headache(784.0)    takes Topamax nightly;last migraine has been a while  . History of blood transfusion    no abnormal reaction noted  . History of bronchitis around 2005  . History of kidney stones   . History of shingles   . Hyperlipidemia    takes Atorvastatin daily  . Hypertension    takes Losaratn daily  . Insomnia    takes Zyprexa nightly  . Pneumonia around 2005    Past Surgical History:  Procedure Laterality Date  . BREAST RECONSTRUCTION WITH PLACEMENT  OF TISSUE EXPANDER AND FLEX HD (ACELLULAR HYDRATED DERMIS) Left 02/10/2014   Procedure: LEFT BREAST RECONSTRUCTION WITH PLACEMENT OF TISSUE EXPANDER AND  FLEX HD (ACELLULAR HYDRATED DERMIS);  Surgeon: Crissie Reese, MD;  Location: [facility];  Service: Plastics;  Laterality: Left;  . COLON SURGERY     d/t diverticulitis  . colostomy take down    . ESOPHAGOGASTRODUODENOSCOPY    . HERNIA REPAIR  2014  . INCISIONAL HERNIA REPAIR    . R kidney tumor removed    . SIMPLE MASTECTOMY WITH AXILLARY SENTINEL NODE BIOPSY Left 02/10/2014   Procedure: LEFT TOTAL MASTECTOMY WITH AXILLARY SENTINEL NODE  BIOPSY;  Surgeon: Edward Jolly, MD;  Location: [place];  Service: General;  Laterality: Left;  . TONSILLECTOMY    . TUBAL LIGATION      Family Psychiatric History: See below  Family History:  Family History  Problem Relation Age of Onset  . Depression Mother   . Alcohol abuse Father     Social History:  Social History   Socioeconomic History  . Marital status: Married    Spouse name: None  . Number of children: None  . Years of education: None  . Highest education level: None  Social Needs  . Financial resource strain: None  . Food insecurity - worry: None  . Food insecurity - inability: None  . Transportation needs - medical: None  . Transportation needs - non-medical: None  Occupational History  . None  Tobacco Use  . Smoking status: Former Research scientist (life sciences)  . Smokeless tobacco: Never Used  . Tobacco comment: quit smoking almost 3 yrs ago  Substance and Sexual Activity  . Alcohol use: No  . Drug use: No  . Sexual activity: Not Currently    Birth control/protection: Post-menopausal  Other Topics Concern  . None  Social History Narrative  . None    Allergies:  Allergies  Allergen Reactions  . Codeine     Nausea/vomiting    Metabolic Disorder Labs: No results found for: HGBA1C, MPG No results found for: PROLACTIN No results found for: CHOL, TRIG, HDL, CHOLHDL, VLDL, LDLCALC No results found for: TSH  Therapeutic Level Labs: No results found for: LITHIUM No results found for: VALPROATE No components found for:  CBMZ  Current Medications: Current Outpatient Medications  Medication Sig Dispense Refill  . ALPRAZolam (XANAX) 0.5 MG tablet Take 1 tablet (0.5 mg total) by mouth 2 (two) times daily as needed for anxiety. 60 tablet 2  . atorvastatin (LIPITOR) 10 MG tablet Take 10 mg by mouth every morning.     . DULoxetine (CYMBALTA) 60 MG capsule Take 1 capsule (60 mg total) by mouth 2 (two) times daily. 60 capsule 2  . exemestane (AROMASIN) 25 MG tablet Take 1  tablet (25 mg total) daily after breakfast by mouth. 90 tablet 2  . lamoTRIgine (LAMICTAL) 100 MG tablet Take 1 tablet (100 mg total) by mouth at bedtime. 30 tablet 2  . OLANZapine (ZYPREXA) 5 MG tablet Take 1 tablet (5 mg total) by mouth at bedtime. 30 tablet 2  . pantoprazole (PROTONIX) 40 MG tablet Take 40 mg by mouth daily as needed (for acid reflux).     . topiramate (TOPAMAX) 100 MG tablet Take 100 mg by mouth daily.     Marland Kitchen topiramate (TOPAMAX) 50 MG tablet Take 50 mg by mouth daily.    . vitamin B-12 (CYANOCOBALAMIN) 1000 MCG tablet Take 1,000 mcg by mouth daily.      No current facility-administered medications  for this visit.      Musculoskeletal: Strength & Muscle Tone: within normal limits Gait & Station: normal Patient leans: N/A  Psychiatric Specialty Exam: Review of Systems  Neurological: Positive for headaches.  All other systems reviewed and are negative.   Blood pressure (!) 155/96, pulse 83, height 5' (1.524 m), weight 155 lb (70.3 kg), SpO2 100 %.Body mass index is 30.27 kg/m.  General Appearance: Casual and Fairly Groomed  Eye Contact:  Good  Speech:  Clear and Coherent  Volume:  Normal  Mood:  Euthymic  Affect:  Congruent  Thought Process:  Goal Directed  Orientation:  Full (Time, Place, and Person)  Thought Content: WDL   Suicidal Thoughts:  No  Homicidal Thoughts:  No  Memory:  Immediate;   Good Recent;   Good Remote;   NA  Judgement:  Good  Insight:  Fair  Psychomotor Activity:  Decreased  Concentration:  Concentration: Fair and Attention Span: Fair  Recall:  Good  Fund of Knowledge: Good  Language: Good  Akathisia:  No  Handed:  Right  AIMS (if indicated): not done  Assets:  Communication Skills Desire for Improvement Resilience Social Support Talents/Skills  ADL's:  Intact  Cognition: Normal  Sleep:  Good   Screenings:   Assessment and Plan: This patient is a 67 year old female with a history of depression and anxiety.  She is  generally doing well on her current regimen.  She will continue Cymbalta 60 mg twice a day for depression, Lamictal 100 mg at bedtime for mood stabilization, olanzapine 5 mg at bedtime also for mood stabilization and Xanax 0.5 mg twice a day as needed for anxiety.  She will return to see me in 3 months   [name], MD 09/02/2017, 2:43 PM

## 2017-10-21 ENCOUNTER — Other Ambulatory Visit (HOSPITAL_COMMUNITY): Payer: Self-pay

## 2017-10-21 DIAGNOSIS — N183 Chronic kidney disease, stage 3 unspecified: Secondary | ICD-10-CM

## 2017-10-21 DIAGNOSIS — Z170 Estrogen receptor positive status [ER+]: Principal | ICD-10-CM

## 2017-10-21 DIAGNOSIS — D631 Anemia in chronic kidney disease: Secondary | ICD-10-CM

## 2017-10-21 DIAGNOSIS — C50112 Malignant neoplasm of central portion of left female breast: Secondary | ICD-10-CM

## 2017-10-22 ENCOUNTER — Other Ambulatory Visit (HOSPITAL_COMMUNITY): Payer: Self-pay

## 2017-10-28 ENCOUNTER — Other Ambulatory Visit (HOSPITAL_COMMUNITY): Payer: Self-pay | Admitting: Internal Medicine

## 2017-10-28 DIAGNOSIS — D0510 Intraductal carcinoma in situ of unspecified breast: Secondary | ICD-10-CM

## 2017-10-29 ENCOUNTER — Ambulatory Visit (HOSPITAL_COMMUNITY): Payer: Self-pay | Admitting: Internal Medicine

## 2017-11-19 ENCOUNTER — Other Ambulatory Visit (HOSPITAL_COMMUNITY): Payer: Self-pay | Admitting: Psychiatry

## 2017-11-24 DIAGNOSIS — F329 Major depressive disorder, single episode, unspecified: Secondary | ICD-10-CM | POA: Diagnosis not present

## 2017-11-24 DIAGNOSIS — I10 Essential (primary) hypertension: Secondary | ICD-10-CM | POA: Diagnosis not present

## 2017-11-24 DIAGNOSIS — E7800 Pure hypercholesterolemia, unspecified: Secondary | ICD-10-CM | POA: Diagnosis not present

## 2017-12-03 ENCOUNTER — Ambulatory Visit (HOSPITAL_COMMUNITY): Payer: Medicare HMO | Admitting: Psychiatry

## 2017-12-03 ENCOUNTER — Encounter (HOSPITAL_COMMUNITY): Payer: Self-pay | Admitting: Psychiatry

## 2017-12-03 VITALS — BP 133/83 | HR 76 | Ht 60.0 in | Wt 152.0 lb

## 2017-12-03 DIAGNOSIS — R51 Headache: Secondary | ICD-10-CM

## 2017-12-03 DIAGNOSIS — F419 Anxiety disorder, unspecified: Secondary | ICD-10-CM | POA: Diagnosis not present

## 2017-12-03 DIAGNOSIS — Z87891 Personal history of nicotine dependence: Secondary | ICD-10-CM | POA: Diagnosis not present

## 2017-12-03 DIAGNOSIS — Z811 Family history of alcohol abuse and dependence: Secondary | ICD-10-CM

## 2017-12-03 DIAGNOSIS — Z818 Family history of other mental and behavioral disorders: Secondary | ICD-10-CM | POA: Diagnosis not present

## 2017-12-03 DIAGNOSIS — F331 Major depressive disorder, recurrent, moderate: Secondary | ICD-10-CM

## 2017-12-03 MED ORDER — DULOXETINE HCL 60 MG PO CPEP: 60.0000 mg | ORAL_CAPSULE | Freq: Two times a day (BID) | ORAL | 2 refills | Status: DC

## 2017-12-03 MED ORDER — LAMOTRIGINE 100 MG PO TABS: 100.0000 mg | ORAL_TABLET | Freq: Every day | ORAL | 2 refills | Status: DC

## 2017-12-03 MED ORDER — ALPRAZOLAM 0.5 MG PO TABS: 0.5000 mg | ORAL_TABLET | Freq: Two times a day (BID) | ORAL | 2 refills | Status: DC | PRN

## 2017-12-03 MED ORDER — OLANZAPINE 5 MG PO TABS: 5.0000 mg | ORAL_TABLET | Freq: Every day | ORAL | 2 refills | Status: DC

## 2017-12-03 NOTE — Progress Notes (Signed)
BH MD/PA/NP OP Progress Note  12/03/2017 2:08 PM Caitlin [name]  MRN:  [id number]  Chief Complaint:  Chief Complaint    Depression; Anxiety; Follow-up     HPI: patient is a 67 year old married white female who lives with her husband in [place]. She has 4 children and 6 grandchildren. She worked in a [employer] until they let her go in 2005. She is self-referred.  The patient states that her depression started in the late 90s. She had to have an emergency colostomy due to diverticulosis. She had complications from the surgery and had to have a hernia repair and several other surgeries on her abdomen. She is left with a huge scar which lowered her self-confidence and self-esteem. She also suffers from migraine headaches.  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 Since then she's really gone downhill. She's become increasingly depressed and anxious. She's been seen at [facility] since 2009 but feels like the most recent psychiatrist does not listen to her and changed all the medications that were helpful. Currently she has lots of headaches, she's tired all the time and doesn't leave the house. She feels like she is a burden to everyone around her and has passive suicidal ideation but no plan. She sleeps okay her appetite has diminished.  In 2011 she was admitted to [place] because she was depressed and psychotic. She thinks this was a reaction to a medication that she got from migraine headache. She doesn't recall the name of the medicine. She was not hospitalized since then has had little therapy  The patient has been return after 3 months.  They tell me that the patient was doing quite well until about 6 weeks ago.  At that time their eldest son who is 38 found out he has esophageal cancer that is spread into his body.  He is now getting chemotherapy and seems to be handling it pretty well.  He is very devout and works part-time as a Company secretary and seems to  be excepting the prognosis.  The patient however is struggling with this and is very tearful today.  She was trying to only take one Xanax a day but this is not working because she is very anxious.  I told her it is okay to take Xanax 0.5 mg twice a day.  She takes it at night because it helps her sleep.  She is trying to stay positive but it has been difficult.  Her husband and other family members are very supportive and she still feels that her medications are helpful Visit Diagnosis:    ICD-10-CM   1. Major depressive disorder, recurrent episode, moderate (HCC) F33.1     Past Psychiatric History: One past psychiatric hospitalization, primary outpatient treatment for depression  Past Medical History:  Past Medical History:  Diagnosis Date  . Anemia many yrs ago   not on any meds  . Anxiety    takes Xanax daily as needed  . Cataracts, bilateral    immature  . Depression    takes Cymbalta daily  . Diverticulitis   . GERD (gastroesophageal reflux disease)    takes Pantoprazole daily as needed  . Headache(784.0)    takes Topamax nightly;last migraine has been a while  . History of blood transfusion    no abnormal reaction noted  . History of bronchitis around 2005  . History of kidney stones   . History of shingles   . Hyperlipidemia  takes Atorvastatin daily  . Hypertension    takes Losaratn daily  . Insomnia    takes Zyprexa nightly  . Pneumonia around 2005    Past Surgical History:  Procedure Laterality Date  . BREAST RECONSTRUCTION WITH PLACEMENT OF TISSUE EXPANDER AND FLEX HD (ACELLULAR HYDRATED DERMIS) Left 02/10/2014   Procedure: LEFT BREAST RECONSTRUCTION WITH PLACEMENT OF TISSUE EXPANDER AND  FLEX HD (ACELLULAR HYDRATED DERMIS);  Surgeon: Crissie Reese, MD;  Location: [place];  Service: Plastics;  Laterality: Left;  . COLON SURGERY     d/t diverticulitis  . colostomy take down    . ESOPHAGOGASTRODUODENOSCOPY    . HERNIA REPAIR  2014  . INCISIONAL HERNIA REPAIR     . R kidney tumor removed    . SIMPLE MASTECTOMY WITH AXILLARY SENTINEL NODE BIOPSY Left 02/10/2014   Procedure: LEFT TOTAL MASTECTOMY WITH AXILLARY SENTINEL NODE BIOPSY;  Surgeon: Edward Jolly, MD;  Location: [place];  Service: General;  Laterality: Left;  . TONSILLECTOMY    . TUBAL LIGATION      Family Psychiatric History: See below  Family History:  Family History  Problem Relation Age of Onset  . Depression Mother   . Alcohol abuse Father     Social History:  Social History   Socioeconomic History  . Marital status: Married    Spouse name: Not on file  . Number of children: Not on file  . Years of education: Not on file  . Highest education level: Not on file  Occupational History  . Not on file  Social Needs  . Financial resource strain: Not on file  . Food insecurity:    Worry: Not on file    Inability: Not on file  . Transportation needs:    Medical: Not on file    Non-medical: Not on file  Tobacco Use  . Smoking status: Former Research scientist (life sciences)  . Smokeless tobacco: Never Used  . Tobacco comment: quit smoking almost 3 yrs ago  Substance and Sexual Activity  . Alcohol use: No  . Drug use: No  . Sexual activity: Not Currently    Birth control/protection: Post-menopausal  Lifestyle  . Physical activity:    Days per week: Not on file    Minutes per session: Not on file  . Stress: Not on file  Relationships  . Social connections:    Talks on phone: Not on file    Gets together: Not on file    Attends religious service: Not on file    Active member of club or organization: Not on file    Attends meetings of clubs or organizations: Not on file    Relationship status: Not on file  Other Topics Concern  . Not on file  Social History Narrative  . Not on file    Allergies:  Allergies  Allergen Reactions  . Codeine     Nausea/vomiting    Metabolic Disorder Labs: No results found for: HGBA1C, MPG No results found for: PROLACTIN No results found for: CHOL,  TRIG, HDL, CHOLHDL, VLDL, LDLCALC No results found for: TSH  Therapeutic Level Labs: No results found for: LITHIUM No results found for: VALPROATE No components found for:  CBMZ  Current Medications: Current Outpatient Medications  Medication Sig Dispense Refill  . ALPRAZolam (XANAX) 0.5 MG tablet Take 1 tablet (0.5 mg total) by mouth 2 (two) times daily as needed for anxiety. 60 tablet 2  . atorvastatin (LIPITOR) 10 MG tablet Take 10 mg by mouth every morning.     Marland Kitchen  DULoxetine (CYMBALTA) 60 MG capsule Take 1 capsule (60 mg total) by mouth 2 (two) times daily. 60 capsule 2  . exemestane (AROMASIN) 25 MG tablet Take 1 tablet (25 mg total) daily after breakfast by mouth. 90 tablet 2  . lamoTRIgine (LAMICTAL) 100 MG tablet Take 1 tablet (100 mg total) by mouth at bedtime. 30 tablet 2  . OLANZapine (ZYPREXA) 5 MG tablet Take 1 tablet (5 mg total) by mouth at bedtime. 30 tablet 2  . pantoprazole (PROTONIX) 40 MG tablet Take 40 mg by mouth daily as needed (for acid reflux).     . topiramate (TOPAMAX) 100 MG tablet Take 100 mg by mouth daily.     Marland Kitchen topiramate (TOPAMAX) 50 MG tablet Take 50 mg by mouth daily.    . vitamin B-12 (CYANOCOBALAMIN) 1000 MCG tablet Take 1,000 mcg by mouth daily.      No current facility-administered medications for this visit.      Musculoskeletal: Strength & Muscle Tone: within normal limits Gait & Station: normal Patient leans: N/A  Psychiatric Specialty Exam: Review of Systems  Neurological: Positive for headaches.  Psychiatric/Behavioral: Positive for depression.  All other systems reviewed and are negative.   Blood pressure 133/83, pulse 76, height 5' (1.524 m), weight 152 lb (68.9 kg), SpO2 98 %.Body mass index is 29.69 kg/m.  General Appearance: Casual, Neat and Well Groomed  Eye Contact:  Fair  Speech:  Clear and Coherent  Volume:  Decreased  Mood:  Anxious and Dysphoric  Affect:  Constricted and Tearful  Thought Process:  Goal Directed   Orientation:  Full (Time, Place, and Person)  Thought Content: Rumination   Suicidal Thoughts:  No  Homicidal Thoughts:  No  Memory:  Immediate;   Good Recent;   Good Remote;   Good  Judgement:  Good  Insight:  Fair  Psychomotor Activity:  Decreased  Concentration:  Concentration: Good and Attention Span: Good  Recall:  Good  Fund of Knowledge: Good  Language: Good  Akathisia:  No  Handed:  Right  AIMS (if indicated): not done  Assets:  Communication Skills Desire for Improvement Social Support Talents/Skills Transportation  ADL's:  Intact  Cognition: WNL  Sleep:  Good   Screenings:   Assessment and Plan: This patient is a 67 year old female with a history of depression and anxiety.  She was doing well until she found out that her older son has cancer.  I urged her to take Xanax 0.5 mg twice a day to help the anxiety.  She will continue Cymbalta 60 mg twice a day for depression Lamictal 100 mg at bedtime for mood stabilization and olanzapine 5 mg at bedtime also for mood stabilization.  She will return to see me in 3 months or call sooner if needed   [name], MD 12/03/2017, 2:08 PM

## 2017-12-03 NOTE — Progress Notes (Signed)
5

## 2017-12-07 ENCOUNTER — Ambulatory Visit (HOSPITAL_COMMUNITY)
Admission: RE | Admit: 2017-12-07 | Discharge: 2017-12-07 | Disposition: A | Discharge status: Home / Self Care | Payer: Medicare HMO | Source: Ambulatory Visit | Attending: Internal Medicine | Admitting: Internal Medicine

## 2017-12-07 ENCOUNTER — Encounter (HOSPITAL_COMMUNITY): Payer: Self-pay

## 2017-12-07 DIAGNOSIS — Z1231 Encounter for screening mammogram for malignant neoplasm of breast: Secondary | ICD-10-CM | POA: Insufficient documentation

## 2017-12-07 DIAGNOSIS — D0510 Intraductal carcinoma in situ of unspecified breast: Secondary | ICD-10-CM

## 2017-12-07 HISTORY — DX: Malignant neoplasm of unspecified site of unspecified female breast: C50.919

## 2017-12-08 DIAGNOSIS — E2839 Other primary ovarian failure: Secondary | ICD-10-CM | POA: Diagnosis not present

## 2017-12-10 ENCOUNTER — Inpatient Hospital Stay (HOSPITAL_COMMUNITY): Payer: Medicare HMO | Attending: Internal Medicine | Admitting: Internal Medicine

## 2017-12-10 ENCOUNTER — Other Ambulatory Visit: Payer: Self-pay

## 2017-12-10 ENCOUNTER — Encounter (HOSPITAL_COMMUNITY): Payer: Self-pay | Admitting: Internal Medicine

## 2017-12-10 DIAGNOSIS — Z79811 Long term (current) use of aromatase inhibitors: Secondary | ICD-10-CM | POA: Diagnosis not present

## 2017-12-10 DIAGNOSIS — M3500 Sicca syndrome, unspecified: Secondary | ICD-10-CM | POA: Diagnosis not present

## 2017-12-10 DIAGNOSIS — N183 Chronic kidney disease, stage 3 (moderate): Secondary | ICD-10-CM | POA: Insufficient documentation

## 2017-12-10 DIAGNOSIS — Z170 Estrogen receptor positive status [ER+]: Secondary | ICD-10-CM | POA: Diagnosis not present

## 2017-12-10 DIAGNOSIS — I129 Hypertensive chronic kidney disease with stage 1 through stage 4 chronic kidney disease, or unspecified chronic kidney disease: Secondary | ICD-10-CM | POA: Insufficient documentation

## 2017-12-10 DIAGNOSIS — C50112 Malignant neoplasm of central portion of left female breast: Secondary | ICD-10-CM | POA: Insufficient documentation

## 2017-12-10 MED ORDER — EXEMESTANE 25 MG PO TABS: 25.0000 mg | ORAL_TABLET | Freq: Every day | ORAL | 4 refills | Status: DC

## 2017-12-10 NOTE — Progress Notes (Signed)
Diagnosis Malignant neoplasm of central portion of left breast in female, estrogen receptor positive ([place]) - Plan: exemestane (AROMASIN) 25 MG tablet  Staging Cancer Staging No matching staging information was found for the patient.  Assessment and Plan: 1.  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  She has been on Aromasin since 2016 and is planned for 5-10 yrs of therapy.  Will determine if BCI testing was done and if not will request evaluation.  Right screening mammogram that was done 12/07/2017 reviewed with pt and was negative.  She is recommended for repeat right screening mammogram imaging in 11/2018.  She needs refills on Aromasin and Rx is sent.  She will be seen for follow-up in 6 months.    2.   R renal oncocytoma.  Pt is s/p partial nephrectomy at [facility].  Pt should continue to follow-up at [facility] as recommended.   3.  CKD.  Outside labs done 10/22/2017 reviewed with pt and shows WBC 6.5 HB 12.8 and plts 240,000.  Cr 1.54.  K+ is  4.8.  Pt should follow-up with nephrology as directed.    4.  HTN.  BP is 135/82.  Follow-up with PCP.    5.  [place].  Follow-up with PCP as directed.    Interval History:   Historical data reviewed from note dated 11/18/2016.  Pt formerly followed by Dr. Talbert Cage.  Pt is a 67 y.o. female with left breast cancer ER+/PR+/HER2-. 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 Patient declined radiation therapy and did not see a radiation therapist until eight months after her surgery. She is on exemestane since 09/2014.  Initial biopsy showed DCIS. Patient was followed by Dr. Tressie Stalker at [facility], secondary to its closure she is now followed here.   Current Status:  Pt is seen today for follow-up.  She is here to go over recent mammogram.  She needs refills on Aromasin.      Malignant neoplasm of central portion of left female breast ([place])   02/10/2014 Surgery    Left modified radical mastectomy with sentinel LN X 1      02/10/2014 Pathology Results    6.5 cm IDC, ER+ 100%, PR + 96%, Ki 67 20% HER 2 -, grade II, LVI +, negative margins 1/1 negative sentinel nodes      10/11/2014 -  Anti-estrogen oral therapy    Exemestane 25 mg daily        Problem List Patient Active Problem List   Diagnosis Date Noted  . Chronic renal impairment, stage 3 (moderate) (HCC) [N18.3] 04/06/2016  . Bronchiectasis without complication ([place]) [Q30.0] 11/21/2015  . Cyst of right kidney [N28.1] 11/22/2014  . Emphysema lung ([place]) [J43.9] 11/22/2014  . Normocytic anemia [D64.9] 11/22/2014  . 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 Anxiety and depression [F41.9, F32.9] 10/11/2014  . Diverticulitis of colon with perforation [K57.20] 10/11/2014  . Hx of hernia repair [id number], Z87.19] 10/11/2014  . Smoking greater than 40 pack years [F17.210] 10/11/2014  . Malignant neoplasm of central portion of left female breast ([place]) [C50.112] 02/10/2014  . Major depressive disorder [F32.9] 03/24/2013  . Chronic anemia [D64.9] 09/02/2012  . Anxiety disorder [F41.9] 08/31/2012  . GERD (gastroesophageal reflux disease) [K21.9] 08/31/2012  . Hypertension [I10] 08/31/2012  . Obesity [E66.9] 07/30/2012  . Renal mass [N28.89] 07/30/2012    Past Medical History Past Medical History:  Diagnosis Date  . Anemia many yrs ago  not on any meds  . Anxiety    takes Xanax daily as needed  . Breast cancer ([place])   . Cataracts, bilateral    immature  . Depression    takes Cymbalta daily  . Diverticulitis   . GERD (gastroesophageal reflux disease)    takes Pantoprazole daily as needed  . Headache(784.0)    takes Topamax nightly;last migraine has been a while  . History of blood transfusion    no abnormal reaction noted  . History of bronchitis around 2005  . History of kidney stones   . History of shingles   . Hyperlipidemia    takes Atorvastatin daily  . Hypertension    takes Losaratn daily   . Insomnia    takes Zyprexa nightly  . Pneumonia around 2005    Past Surgical History Past Surgical History:  Procedure Laterality Date  . BREAST RECONSTRUCTION WITH PLACEMENT OF TISSUE EXPANDER AND FLEX HD (ACELLULAR HYDRATED DERMIS) Left 02/10/2014   Procedure: LEFT BREAST RECONSTRUCTION WITH PLACEMENT OF TISSUE EXPANDER AND  FLEX HD (ACELLULAR HYDRATED DERMIS);  Surgeon: Crissie Reese, MD;  Location: [place];  Service: Plastics;  Laterality: Left;  . COLON SURGERY     d/t diverticulitis  . colostomy take down    . ESOPHAGOGASTRODUODENOSCOPY    . HERNIA REPAIR  2014  . INCISIONAL HERNIA REPAIR    . MASTECTOMY Left   . R kidney tumor removed    . SIMPLE MASTECTOMY WITH AXILLARY SENTINEL NODE BIOPSY Left 02/10/2014   Procedure: LEFT TOTAL MASTECTOMY WITH AXILLARY SENTINEL NODE BIOPSY;  Surgeon: Edward Jolly, MD;  Location: [place];  Service: General;  Laterality: Left;  . TONSILLECTOMY    . TUBAL LIGATION      Family History Family History  Problem Relation Age of Onset  . Depression Mother   . Alcohol abuse Father      Social History  reports that she has quit smoking. She has never used smokeless tobacco. She reports that she does not drink alcohol or use drugs.  Medications  Current Outpatient Medications:  .  ALPRAZolam (XANAX) 0.5 MG tablet, Take 1 tablet (0.5 mg total) by mouth 2 (two) times daily as needed for anxiety., Disp: 60 tablet, Rfl: 2 .  atorvastatin (LIPITOR) 10 MG tablet, Take 10 mg by mouth every morning. , Disp: , Rfl:  .  cetirizine (ZYRTEC) 10 MG tablet, Take 10 mg by mouth at bedtime., Disp: , Rfl: 12 .  DULoxetine (CYMBALTA) 60 MG capsule, Take 1 capsule (60 mg total) by mouth 2 (two) times daily., Disp: 60 capsule, Rfl: 2 .  exemestane (AROMASIN) 25 MG tablet, Take 1 tablet (25 mg total) by mouth daily after breakfast., Disp: 90 tablet, Rfl: 4 .  lamoTRIgine (LAMICTAL) 100 MG tablet, Take 1 tablet (100 mg total) by mouth at bedtime., Disp: 30  tablet, Rfl: 2 .  OLANZapine (ZYPREXA) 5 MG tablet, Take 1 tablet (5 mg total) by mouth at bedtime., Disp: 30 tablet, Rfl: 2 .  pantoprazole (PROTONIX) 40 MG tablet, Take 40 mg by mouth daily as needed (for acid reflux). , Disp: , Rfl:  .  topiramate (TOPAMAX) 100 MG tablet, Take 100 mg by mouth daily. , Disp: , Rfl:  .  topiramate (TOPAMAX) 50 MG tablet, Take 50 mg by mouth daily., Disp: , Rfl:  .  vitamin B-12 (CYANOCOBALAMIN) 1000 MCG tablet, Take 1,000 mcg by mouth daily. , Disp: , Rfl:   Allergies Codeine  Review of Systems Review of  Systems - Oncology ROS as per HPI otherwise 12 point ROS is negative.   Physical Exam  Vitals Wt Readings from Last 3 Encounters:  05/01/17 (P) 156 lb (70.8 kg)  10/29/16 149 lb 12.8 oz (67.9 kg)  07/02/16 147 lb 8 oz (66.9 kg)   Temp Readings from Last 3 Encounters:  05/01/17 (P) 98 F (36.7 C) ((P) Oral)  10/29/16 97.9 F (36.6 C) (Oral)  07/02/16 98.6 F (37 C) (Oral)   BP Readings from Last 3 Encounters:  05/01/17 (P) 138/82  10/29/16 (!) 145/76  07/02/16 133/77   Pulse Readings from Last 3 Encounters:  05/01/17 (P) 92  10/29/16 94  07/02/16 88    Constitutional: Well-developed, well-nourished, and in no distress.   HENT:Head: Normocephalic and atraumatic.  Mouth/Throat: No oropharyngeal exudate. Mucosa moist. Eyes: Pupils are equal, round, and reactive to light. Conjunctivae are normal. No scleral icterus.  Neck: Normal range of motion. Neck supple. No JVD present.  Cardiovascular: Normal rate, regular rhythm and normal heart sounds.  Exam reveals no gallop and no friction rub.   No murmur heard. Pulmonary/Chest: Effort normal and breath sounds normal. No respiratory distress. No wheezes.No rales.  Abdominal: Soft. Bowel sounds are normal. No distension. There is no tenderness. There is no guarding.  Musculoskeletal: No edema or tenderness.  Lymphadenopathy: No cervical, axillary or supraclavicular adenopathy.  Neurological:  Alert and oriented to person, place, and time. No cranial nerve deficit.  Skin: Skin is warm and dry. No rash noted. No erythema. No pallor.  Psychiatric: Affect and judgment normal.  Bilateral breast exam:  Chaperone present.  Left mastectomy with reconstruction.  Right breast shows no dominant masses.    Labs No visits with results within 3 Day(s) from this visit.  Latest known visit with results is:  Office Visit on 04/01/2016  Component Date Value Ref Range Status  . WBC 04/01/2016 6.4  4.0 - 10.5 K/uL Final  . RBC 04/01/2016 3.97  3.87 - 5.11 MIL/uL Final  . Hemoglobin 04/01/2016 10.8* 12.0 - 15.0 g/dL Final  . HCT 04/01/2016 33.9* 36.0 - 46.0 % Final  . MCV 04/01/2016 85.4  78.0 - 100.0 fL Final  . MCH 04/01/2016 27.2  26.0 - 34.0 pg Final  . MCHC 04/01/2016 31.9  30.0 - 36.0 g/dL Final  . RDW 04/01/2016 15.0  11.5 - 15.5 % Final  . Platelets 04/01/2016 245  150 - 400 K/uL Final  . Neutrophils Relative % 04/01/2016 64  % Final  . Neutro Abs 04/01/2016 4.0  1.7 - 7.7 K/uL Final  . Lymphocytes Relative 04/01/2016 24  % Final  . Lymphs Abs 04/01/2016 1.5  0.7 - 4.0 K/uL Final  . Monocytes Relative 04/01/2016 9  % Final  . Monocytes Absolute 04/01/2016 0.6  0.1 - 1.0 K/uL Final  . Eosinophils Relative 04/01/2016 3  % Final  . Eosinophils Absolute 04/01/2016 0.2  0.0 - 0.7 K/uL Final  . Basophils Relative 04/01/2016 0  % Final  . Basophils Absolute 04/01/2016 0.0  0.0 - 0.1 K/uL Final  . Sodium 04/01/2016 137  135 - 145 mmol/L Final  . Potassium 04/01/2016 4.0  3.5 - 5.1 mmol/L Final  . Chloride 04/01/2016 109  101 - 111 mmol/L Final  . CO2 04/01/2016 22  22 - 32 mmol/L Final  . Glucose, Bld 04/01/2016 91  65 - 99 mg/dL Final  . BUN 04/01/2016 24* 6 - 20 mg/dL Final  . Creatinine, Ser 04/01/2016 1.60* 0.44 - 1.00 mg/dL Final  .  Calcium 04/01/2016 9.8  8.9 - 10.3 mg/dL Final  . Total Protein 04/01/2016 8.6* 6.5 - 8.1 g/dL Final  . Albumin 04/01/2016 3.9  3.5 - 5.0 g/dL Final  .  AST 04/01/2016 17  15 - 41 U/L Final  . ALT 04/01/2016 16  14 - 54 U/L Final  . Alkaline Phosphatase 04/01/2016 66  38 - 126 U/L Final  . Total Bilirubin 04/01/2016 0.4  0.3 - 1.2 mg/dL Final  . GFR calc non Af Amer 04/01/2016 33* >60 mL/min Final  . GFR calc Af Amer 04/01/2016 38* >60 mL/min Final   Comment: (NOTE) The eGFR has been calculated using the CKD EPI equation. This calculation has not been validated in all clinical situations. eGFR's persistently <60 mL/min signify possible Chronic Kidney Disease.   . Anion gap 04/01/2016 6  5 - 15 Final  . SSA ([place]) (ENA) Antibody, IgG 04/01/2016 >8.0* 0.0 - 0.9 AI Final  . SSB ([place]) (ENA) Antibody, IgG 04/01/2016 >8.0* 0.0 - 0.9 AI Final   Comment: (NOTE) Performed At: [facility] [address] [place], [place] [id number] Lindon Romp MD QU:[id number]      Pathology No orders of the defined types were placed in this encounter.      Zoila Shutter MD

## 2017-12-30 DIAGNOSIS — Z6830 Body mass index (BMI) 30.0-30.9, adult: Secondary | ICD-10-CM | POA: Diagnosis not present

## 2017-12-30 DIAGNOSIS — Z299 Encounter for prophylactic measures, unspecified: Secondary | ICD-10-CM | POA: Diagnosis not present

## 2017-12-30 DIAGNOSIS — C50919 Malignant neoplasm of unspecified site of unspecified female breast: Secondary | ICD-10-CM | POA: Diagnosis not present

## 2017-12-30 DIAGNOSIS — I10 Essential (primary) hypertension: Secondary | ICD-10-CM | POA: Diagnosis not present

## 2017-12-30 DIAGNOSIS — M3500 Sicca syndrome, unspecified: Secondary | ICD-10-CM | POA: Diagnosis not present

## 2017-12-30 DIAGNOSIS — M8580 Other specified disorders of bone density and structure, unspecified site: Secondary | ICD-10-CM | POA: Diagnosis not present

## 2018-01-14 DIAGNOSIS — I10 Essential (primary) hypertension: Secondary | ICD-10-CM | POA: Diagnosis not present

## 2018-01-14 DIAGNOSIS — F329 Major depressive disorder, single episode, unspecified: Secondary | ICD-10-CM | POA: Diagnosis not present

## 2018-01-14 DIAGNOSIS — E7800 Pure hypercholesterolemia, unspecified: Secondary | ICD-10-CM | POA: Diagnosis not present

## 2018-01-26 DIAGNOSIS — I10 Essential (primary) hypertension: Secondary | ICD-10-CM | POA: Diagnosis not present

## 2018-01-26 DIAGNOSIS — M3500 Sicca syndrome, unspecified: Secondary | ICD-10-CM | POA: Diagnosis not present

## 2018-01-26 DIAGNOSIS — C50919 Malignant neoplasm of unspecified site of unspecified female breast: Secondary | ICD-10-CM | POA: Diagnosis not present

## 2018-01-26 DIAGNOSIS — Z6830 Body mass index (BMI) 30.0-30.9, adult: Secondary | ICD-10-CM | POA: Diagnosis not present

## 2018-01-26 DIAGNOSIS — E7800 Pure hypercholesterolemia, unspecified: Secondary | ICD-10-CM | POA: Diagnosis not present

## 2018-01-26 DIAGNOSIS — Z299 Encounter for prophylactic measures, unspecified: Secondary | ICD-10-CM | POA: Diagnosis not present

## 2018-02-12 DIAGNOSIS — F329 Major depressive disorder, single episode, unspecified: Secondary | ICD-10-CM | POA: Diagnosis not present

## 2018-02-12 DIAGNOSIS — E7800 Pure hypercholesterolemia, unspecified: Secondary | ICD-10-CM | POA: Diagnosis not present

## 2018-02-12 DIAGNOSIS — I10 Essential (primary) hypertension: Secondary | ICD-10-CM | POA: Diagnosis not present

## 2018-02-24 ENCOUNTER — Other Ambulatory Visit (HOSPITAL_COMMUNITY): Payer: Self-pay | Admitting: Psychiatry

## 2018-03-08 ENCOUNTER — Encounter (HOSPITAL_COMMUNITY): Payer: Self-pay | Admitting: Psychiatry

## 2018-03-08 ENCOUNTER — Ambulatory Visit (HOSPITAL_COMMUNITY): Payer: Medicare HMO | Admitting: Psychiatry

## 2018-03-08 VITALS — BP 136/82 | HR 92 | Ht 60.0 in | Wt 157.0 lb

## 2018-03-08 DIAGNOSIS — F331 Major depressive disorder, recurrent, moderate: Secondary | ICD-10-CM

## 2018-03-08 DIAGNOSIS — Z811 Family history of alcohol abuse and dependence: Secondary | ICD-10-CM | POA: Diagnosis not present

## 2018-03-08 DIAGNOSIS — Z87891 Personal history of nicotine dependence: Secondary | ICD-10-CM

## 2018-03-08 DIAGNOSIS — Z818 Family history of other mental and behavioral disorders: Secondary | ICD-10-CM | POA: Diagnosis not present

## 2018-03-08 MED ORDER — LAMOTRIGINE 100 MG PO TABS: 100.0000 mg | ORAL_TABLET | Freq: Every day | ORAL | 2 refills | Status: DC

## 2018-03-08 MED ORDER — OLANZAPINE 5 MG PO TABS: 5.0000 mg | ORAL_TABLET | Freq: Every day | ORAL | 2 refills | Status: DC

## 2018-03-08 MED ORDER — ALPRAZOLAM 0.5 MG PO TABS: 0.5000 mg | ORAL_TABLET | Freq: Two times a day (BID) | ORAL | 2 refills | Status: DC | PRN

## 2018-03-08 MED ORDER — DULOXETINE HCL 60 MG PO CPEP: 60.0000 mg | ORAL_CAPSULE | Freq: Two times a day (BID) | ORAL | 2 refills | Status: DC

## 2018-03-08 NOTE — Progress Notes (Signed)
[place] MD/PA/NP OP Progress Note  03/08/2018 1:20 PM Caitlin [name]  MRN:  [id number]  Chief Complaint:  Chief Complaint    Depression; Anxiety; Follow-up     HPI: patient is a 67 year old married white female who lives with her husband in [place]. She has 4 children and 6 grandchildren. She worked in a [employer] until they let her go in 2005. She is self-referred.  The patient states that her depression started in the late 90s. She had to have an emergency colostomy due to diverticulosis. She had complications from the surgery and had to have a hernia repair and several other surgeries on her abdomen. She is left with a huge scar which lowered her self-confidence and self-esteem. She also suffers from migraine headaches.  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 Since then she's really gone downhill. She's become increasingly depressed and anxious. She's been seen at [facility] since 2009 but feels like the most recent psychiatrist does not listen to her and changed all the medications that were helpful. Currently she has lots of headaches, she's tired all the time and doesn't leave the house. She feels like she is a burden to everyone around her and has passive suicidal ideation but no plan. She sleeps okay her appetite has diminished.  In 2011 she was admitted to [facility] because she was depressed and psychotic. She thinks this was a reaction to a medication that she got from migraine headache. She doesn't recall the name of the medicine. She was not hospitalized since then has had little therapy  The patient returns after 3 months.  Last time she and her husband told me that their 15 year old son was diagnosed with esophageal cancer that had metastasized.  He is undergoing treatment and the tumor has shrunk.  It does not sound that his cancer is curable.  Nevertheless they are trying to keep a positive attitude.  They state that their son is a Company secretary and  he is very upbeat and positive.  The patient in general is sleeping well her energy is good.  Sometimes she awakens early.  However she naps a lot during the day.  She denies any symptoms of depression or anxiety.  She rarely has to take 2 Xanax a day and only uses 1 most of the time. Visit Diagnosis:    ICD-10-CM   1. Major depressive disorder, recurrent episode, moderate (HCC) F33.1     Past Psychiatric History: One past psychiatric hospitalization, primarily outpatient treatment for depression  Past Medical History:  Past Medical History:  Diagnosis Date  . Anemia many yrs ago   not on any meds  . Anxiety    takes Xanax daily as needed  . Breast cancer ([place])   . Cataracts, bilateral    immature  . Depression    takes Cymbalta daily  . Diverticulitis   . GERD (gastroesophageal reflux disease)    takes Pantoprazole daily as needed  . Headache(784.0)    takes Topamax nightly;last migraine has been a while  . History of blood transfusion    no abnormal reaction noted  . History of bronchitis around 2005  . History of kidney stones   . History of shingles   . Hyperlipidemia    takes Atorvastatin daily  . Hypertension    takes Losaratn daily  . Insomnia    takes Zyprexa nightly  . Pneumonia around 2005    Past Surgical History:  Procedure Laterality  Date  . BREAST RECONSTRUCTION WITH PLACEMENT OF TISSUE EXPANDER AND FLEX HD (ACELLULAR HYDRATED DERMIS) Left 02/10/2014   Procedure: LEFT BREAST RECONSTRUCTION WITH PLACEMENT OF TISSUE EXPANDER AND  FLEX HD (ACELLULAR HYDRATED DERMIS);  Surgeon: Crissie Reese, MD;  Location: [facility];  Service: Plastics;  Laterality: Left;  . COLON SURGERY     d/t diverticulitis  . colostomy take down    . ESOPHAGOGASTRODUODENOSCOPY    . HERNIA REPAIR  2014  . INCISIONAL HERNIA REPAIR    . MASTECTOMY Left   . R kidney tumor removed    . SIMPLE MASTECTOMY WITH AXILLARY SENTINEL NODE BIOPSY Left 02/10/2014   Procedure: LEFT TOTAL MASTECTOMY WITH  AXILLARY SENTINEL NODE BIOPSY;  Surgeon: Edward Jolly, MD;  Location: [facility];  Service: General;  Laterality: Left;  . TONSILLECTOMY    . TUBAL LIGATION      Family Psychiatric History: See below  Family History:  Family History  Problem Relation Age of Onset  . Depression Mother   . Alcohol abuse Father     Social History:  Social History   Socioeconomic History  . Marital status: Married    Spouse name: Not on file  . Number of children: Not on file  . Years of education: Not on file  . Highest education level: Not on file  Occupational History  . Not on file  Social Needs  . Financial resource strain: Not on file  . Food insecurity:    Worry: Not on file    Inability: Not on file  . Transportation needs:    Medical: Not on file    Non-medical: Not on file  Tobacco Use  . Smoking status: Former Research scientist (life sciences)  . Smokeless tobacco: Never Used  . Tobacco comment: quit smoking almost 3 yrs ago  Substance and Sexual Activity  . Alcohol use: No  . Drug use: No  . Sexual activity: Not Currently    Birth control/protection: Post-menopausal  Lifestyle  . Physical activity:    Days per week: Not on file    Minutes per session: Not on file  . Stress: Not on file  Relationships  . Social connections:    Talks on phone: Not on file    Gets together: Not on file    Attends religious service: Not on file    Active member of club or organization: Not on file    Attends meetings of clubs or organizations: Not on file    Relationship status: Not on file  Other Topics Concern  . Not on file  Social History Narrative  . Not on file    Allergies:  Allergies  Allergen Reactions  . Codeine     Nausea/vomiting    Metabolic Disorder Labs: No results found for: HGBA1C, MPG No results found for: PROLACTIN No results found for: CHOL, TRIG, HDL, CHOLHDL, VLDL, LDLCALC No results found for: TSH  Therapeutic Level Labs: No results found for: LITHIUM No results found for:  VALPROATE No components found for:  CBMZ  Current Medications: Current Outpatient Medications  Medication Sig Dispense Refill  . ALPRAZolam (XANAX) 0.5 MG tablet Take 1 tablet (0.5 mg total) by mouth 2 (two) times daily as needed for anxiety. 60 tablet 2  . atorvastatin (LIPITOR) 10 MG tablet Take 10 mg by mouth every morning.     . cetirizine (ZYRTEC) 10 MG tablet Take 10 mg by mouth at bedtime.  12  . DULoxetine (CYMBALTA) 60 MG capsule Take 1 capsule (60 mg  total) by mouth 2 (two) times daily. 60 capsule 2  . exemestane (AROMASIN) 25 MG tablet Take 1 tablet (25 mg total) by mouth daily after breakfast. 90 tablet 4  . lamoTRIgine (LAMICTAL) 100 MG tablet Take 1 tablet (100 mg total) by mouth at bedtime. 30 tablet 2  . OLANZapine (ZYPREXA) 5 MG tablet Take 1 tablet (5 mg total) by mouth at bedtime. 30 tablet 2  . pantoprazole (PROTONIX) 40 MG tablet Take 40 mg by mouth daily as needed (for acid reflux).     . topiramate (TOPAMAX) 100 MG tablet Take 100 mg by mouth daily.     Marland Kitchen topiramate (TOPAMAX) 50 MG tablet Take 50 mg by mouth daily.    . vitamin B-12 (CYANOCOBALAMIN) 1000 MCG tablet Take 1,000 mcg by mouth daily.      No current facility-administered medications for this visit.      Musculoskeletal: Strength & Muscle Tone: within normal limits Gait & Station: normal Patient leans: N/A  Psychiatric Specialty Exam: Review of Systems  Neurological: Positive for headaches.  All other systems reviewed and are negative.   Blood pressure 136/82, pulse 92, height 5' (1.524 m), weight 157 lb (71.2 kg), SpO2 96 %.Body mass index is 30.66 kg/m.  General Appearance: Casual, Neat and Well Groomed  Eye Contact:  Good  Speech:  Clear and Coherent  Volume:  Normal  Mood:  Euthymic  Affect:  Congruent  Thought Process:  Goal Directed  Orientation:  Full (Time, Place, and Person)  Thought Content: Rumination   Suicidal Thoughts:  No  Homicidal Thoughts:  No  Memory:  Immediate;    Good Recent;   Good Remote;   Good  Judgement:  Fair  Insight:  Fair  Psychomotor Activity:  Normal  Concentration:  Concentration: Fair and Attention Span: Fair  Recall:  Good  Fund of Knowledge: Fair  Language: Good  Akathisia:  No  Handed:  Right  AIMS (if indicated): not done  Assets:  Communication Skills Desire for Improvement Resilience Social Support Talents/Skills  ADL's:  Intact  Cognition: WNL  Sleep:  Good   Screenings:   Assessment and Plan: This patient is a 67 year old female with a history of depression and anxiety.  She seems to be doing well on her current regimen.  She will continue olanzapine 5 mg at bedtime for augmentation, Cymbalta 60 mg twice daily for depression Lamictal 100 mg at bedtime for mood stabilization and Xanax 0.5 mg 2 times daily as needed for anxiety.  She will return to see me in 3 months   [name], MD 03/08/2018, 1:20 PM

## 2018-03-12 DIAGNOSIS — I10 Essential (primary) hypertension: Secondary | ICD-10-CM | POA: Diagnosis not present

## 2018-03-12 DIAGNOSIS — F329 Major depressive disorder, single episode, unspecified: Secondary | ICD-10-CM | POA: Diagnosis not present

## 2018-03-12 DIAGNOSIS — E7800 Pure hypercholesterolemia, unspecified: Secondary | ICD-10-CM | POA: Diagnosis not present

## 2018-04-09 DIAGNOSIS — F329 Major depressive disorder, single episode, unspecified: Secondary | ICD-10-CM | POA: Diagnosis not present

## 2018-04-09 DIAGNOSIS — I10 Essential (primary) hypertension: Secondary | ICD-10-CM | POA: Diagnosis not present

## 2018-04-09 DIAGNOSIS — E7800 Pure hypercholesterolemia, unspecified: Secondary | ICD-10-CM | POA: Diagnosis not present

## 2018-05-04 DIAGNOSIS — I10 Essential (primary) hypertension: Secondary | ICD-10-CM | POA: Diagnosis not present

## 2018-05-04 DIAGNOSIS — F329 Major depressive disorder, single episode, unspecified: Secondary | ICD-10-CM | POA: Diagnosis not present

## 2018-05-04 DIAGNOSIS — E7800 Pure hypercholesterolemia, unspecified: Secondary | ICD-10-CM | POA: Diagnosis not present

## 2018-05-17 DIAGNOSIS — Z6830 Body mass index (BMI) 30.0-30.9, adult: Secondary | ICD-10-CM | POA: Diagnosis not present

## 2018-05-17 DIAGNOSIS — I10 Essential (primary) hypertension: Secondary | ICD-10-CM | POA: Diagnosis not present

## 2018-05-17 DIAGNOSIS — M3500 Sicca syndrome, unspecified: Secondary | ICD-10-CM | POA: Diagnosis not present

## 2018-05-17 DIAGNOSIS — C50919 Malignant neoplasm of unspecified site of unspecified female breast: Secondary | ICD-10-CM | POA: Diagnosis not present

## 2018-05-17 DIAGNOSIS — Z299 Encounter for prophylactic measures, unspecified: Secondary | ICD-10-CM | POA: Diagnosis not present

## 2018-05-21 ENCOUNTER — Other Ambulatory Visit (HOSPITAL_COMMUNITY): Payer: Self-pay | Admitting: Psychiatry

## 2018-06-07 ENCOUNTER — Ambulatory Visit (HOSPITAL_COMMUNITY): Payer: Medicare HMO | Admitting: Psychiatry

## 2018-06-07 DIAGNOSIS — J040 Acute laryngitis: Secondary | ICD-10-CM | POA: Diagnosis not present

## 2018-06-07 DIAGNOSIS — Z299 Encounter for prophylactic measures, unspecified: Secondary | ICD-10-CM | POA: Diagnosis not present

## 2018-06-07 DIAGNOSIS — Z6830 Body mass index (BMI) 30.0-30.9, adult: Secondary | ICD-10-CM | POA: Diagnosis not present

## 2018-06-07 DIAGNOSIS — M545 Low back pain: Secondary | ICD-10-CM | POA: Diagnosis not present

## 2018-06-07 DIAGNOSIS — K1121 Acute sialoadenitis: Secondary | ICD-10-CM | POA: Diagnosis not present

## 2018-06-07 DIAGNOSIS — I10 Essential (primary) hypertension: Secondary | ICD-10-CM | POA: Diagnosis not present

## 2018-06-08 DIAGNOSIS — M3500 Sicca syndrome, unspecified: Secondary | ICD-10-CM | POA: Diagnosis not present

## 2018-06-08 DIAGNOSIS — Z299 Encounter for prophylactic measures, unspecified: Secondary | ICD-10-CM | POA: Diagnosis not present

## 2018-06-08 DIAGNOSIS — I10 Essential (primary) hypertension: Secondary | ICD-10-CM | POA: Diagnosis not present

## 2018-06-08 DIAGNOSIS — C50919 Malignant neoplasm of unspecified site of unspecified female breast: Secondary | ICD-10-CM | POA: Diagnosis not present

## 2018-06-08 DIAGNOSIS — Z6829 Body mass index (BMI) 29.0-29.9, adult: Secondary | ICD-10-CM | POA: Diagnosis not present

## 2018-06-08 DIAGNOSIS — K1121 Acute sialoadenitis: Secondary | ICD-10-CM | POA: Diagnosis not present

## 2018-06-09 ENCOUNTER — Encounter (HOSPITAL_COMMUNITY): Payer: Self-pay | Admitting: Internal Medicine

## 2018-06-09 ENCOUNTER — Inpatient Hospital Stay (HOSPITAL_COMMUNITY): Payer: Medicare HMO | Attending: Hematology | Admitting: Internal Medicine

## 2018-06-09 ENCOUNTER — Other Ambulatory Visit: Payer: Self-pay

## 2018-06-09 VITALS — BP 130/70 | HR 86 | Temp 98.3°F | Resp 16 | Wt 149.4 lb

## 2018-06-09 DIAGNOSIS — Z170 Estrogen receptor positive status [ER+]: Secondary | ICD-10-CM | POA: Diagnosis not present

## 2018-06-09 DIAGNOSIS — M3500 Sicca syndrome, unspecified: Secondary | ICD-10-CM | POA: Insufficient documentation

## 2018-06-09 DIAGNOSIS — I129 Hypertensive chronic kidney disease with stage 1 through stage 4 chronic kidney disease, or unspecified chronic kidney disease: Secondary | ICD-10-CM | POA: Diagnosis not present

## 2018-06-09 DIAGNOSIS — D3001 Benign neoplasm of right kidney: Secondary | ICD-10-CM | POA: Diagnosis not present

## 2018-06-09 DIAGNOSIS — B370 Candidal stomatitis: Secondary | ICD-10-CM | POA: Diagnosis not present

## 2018-06-09 DIAGNOSIS — Z299 Encounter for prophylactic measures, unspecified: Secondary | ICD-10-CM | POA: Diagnosis not present

## 2018-06-09 DIAGNOSIS — K1120 Sialoadenitis, unspecified: Secondary | ICD-10-CM | POA: Diagnosis not present

## 2018-06-09 DIAGNOSIS — C50112 Malignant neoplasm of central portion of left female breast: Secondary | ICD-10-CM | POA: Diagnosis not present

## 2018-06-09 DIAGNOSIS — K1121 Acute sialoadenitis: Secondary | ICD-10-CM | POA: Diagnosis not present

## 2018-06-09 DIAGNOSIS — N183 Chronic kidney disease, stage 3 (moderate): Secondary | ICD-10-CM | POA: Insufficient documentation

## 2018-06-09 DIAGNOSIS — Z6829 Body mass index (BMI) 29.0-29.9, adult: Secondary | ICD-10-CM | POA: Diagnosis not present

## 2018-06-09 DIAGNOSIS — Z79811 Long term (current) use of aromatase inhibitors: Secondary | ICD-10-CM | POA: Diagnosis not present

## 2018-06-09 MED ORDER — EXEMESTANE 25 MG PO TABS: 25.0000 mg | ORAL_TABLET | Freq: Every day | ORAL | 4 refills | Status: DC

## 2018-06-09 NOTE — Progress Notes (Signed)
Diagnosis Malignant neoplasm of central portion of left breast in female, estrogen receptor positive ([place]) - Plan: MM Digital Screening Unilat R, exemestane (AROMASIN) 25 MG tablet  Staging Cancer Staging No matching staging information was found for the patient.  Assessment and Plan:   1.  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  She has been on Aromasin since 2016 and is planned for 5-10 yrs of therapy.  Right screening mammogram that was done 12/07/2017 reviewed with pt anwas negative.  She is recommended for repeat right screening mammogram imaging in 11/2018 which is ordered.  She needs refills on Aromasin and Rx is sent.  She will be seen for follow-up in 11/2018 with mammogram results.    2.  Right salivary gland infection.  She has some swelling noted on right side of face.  Pt reports she had a similar episode in 2017.  She was seen by ENT at that time.  Pt is on levaquin and reports area is improving.  Pt advised To follow-up with Dr. Benjamine Mola who they have seen previously if no improvement in next week.    3.   R renal oncocytoma.  Pt is s/p partial nephrectomy at [facility].  Pt should continue to follow-up at [facility] as recommended.   4.  CKD.  Outside labs done 10/22/2017 shows WBC 6.5 HB 12.8 and plts 240,000.  Cr 1.54.  K+ is  4.8.  Pt should follow-up with nephrology as directed.    5.  HTN.  BP is 130/70.  Follow-up with PCP.    6  [place].  Follow-up with PCP as directed.    25 minutes spent with more than 50% spent in counseling and coordination of care.    Interval History:   Historical data reviewed from note dated 11/18/2016.  Pt formerly followed by Dr. Talbert Cage.  Pt is a 67 y.o. female with left breast cancer ER+/PR+/HER2-. 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 Patient declined radiation therapy and did not see a radiation therapist until eight months after her surgery. She is on exemestane since 09/2014.  Initial biopsy showed DCIS.  Patient was followed by Dr. Tressie Stalker at [facility], secondary to its closure she is now followed here.   Current Status:  Pt is seen today for follow-up.  She has a right salivary gland infection and laryngitis and is on abx.   She needs refills on Aromasin.      Malignant neoplasm of central portion of left female breast ([place])   02/10/2014 Surgery    Left modified radical mastectomy with sentinel LN X 1    02/10/2014 Pathology Results    6.5 cm IDC, ER+ 100%, PR + 96%, Ki 67 20% HER 2 -, grade II, LVI +, negative margins 1/1 negative sentinel nodes    10/11/2014 -  Anti-estrogen oral therapy    Exemestane 25 mg daily      Problem List Patient Active Problem List   Diagnosis Date Noted  . Chronic renal impairment, stage 3 (moderate) (HCC) [N18.3] 04/06/2016  . Bronchiectasis without complication ([place]) [U54.2] 11/21/2015  . Cyst of right kidney [N28.1] 11/22/2014  . Emphysema lung ([place]) [J43.9] 11/22/2014  . Normocytic anemia [D64.9] 11/22/2014  . 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 Anxiety and depression [F41.9, F32.9] 10/11/2014  . Diverticulitis of colon with perforation [K57.20] 10/11/2014  . Hx of hernia repair [id number], Z87.19] 10/11/2014  . Smoking greater than 40  pack years [F17.210] 10/11/2014  . Malignant neoplasm of central portion of left female breast ([place]) [C50.112] 02/10/2014  . Major depressive disorder [F32.9] 03/24/2013  . Chronic anemia [D64.9] 09/02/2012  . Anxiety disorder [F41.9] 08/31/2012  . GERD (gastroesophageal reflux disease) [K21.9] 08/31/2012  . Hypertension [I10] 08/31/2012  . Obesity [E66.9] 07/30/2012  . Renal mass [N28.89] 07/30/2012    Past Medical History Past Medical History:  Diagnosis Date  . Anemia many yrs ago   not on any meds  . Anxiety    takes Xanax daily as needed  . Breast cancer ([place])   . Cataracts, bilateral    immature  . Depression    takes Cymbalta daily  .  Diverticulitis   . GERD (gastroesophageal reflux disease)    takes Pantoprazole daily as needed  . Headache(784.0)    takes Topamax nightly;last migraine has been a while  . History of blood transfusion    no abnormal reaction noted  . History of bronchitis around 2005  . History of kidney stones   . History of shingles   . Hyperlipidemia    takes Atorvastatin daily  . Hypertension    takes Losaratn daily  . Insomnia    takes Zyprexa nightly  . Pneumonia around 2005    Past Surgical History Past Surgical History:  Procedure Laterality Date  . BREAST RECONSTRUCTION WITH PLACEMENT OF TISSUE EXPANDER AND FLEX HD (ACELLULAR HYDRATED DERMIS) Left 02/10/2014   Procedure: LEFT BREAST RECONSTRUCTION WITH PLACEMENT OF TISSUE EXPANDER AND  FLEX HD (ACELLULAR HYDRATED DERMIS);  Surgeon: Crissie Reese, MD;  Location: [facility];  Service: Plastics;  Laterality: Left;  . COLON SURGERY     d/t diverticulitis  . colostomy take down    . ESOPHAGOGASTRODUODENOSCOPY    . HERNIA REPAIR  2014  . INCISIONAL HERNIA REPAIR    . MASTECTOMY Left   . R kidney tumor removed    . SIMPLE MASTECTOMY WITH AXILLARY SENTINEL NODE BIOPSY Left 02/10/2014   Procedure: LEFT TOTAL MASTECTOMY WITH AXILLARY SENTINEL NODE BIOPSY;  Surgeon: Edward Jolly, MD;  Location: [facility];  Service: General;  Laterality: Left;  . TONSILLECTOMY    . TUBAL LIGATION      Family History Family History  Problem Relation Age of Onset  . Depression Mother   . Alcohol abuse Father      Social History  reports that she has quit smoking. She has never used smokeless tobacco. She reports that she does not drink alcohol or use drugs.  Medications  Current Outpatient Medications:  .  levofloxacin (LEVAQUIN) 500 MG tablet, Take 500 mg by mouth daily., Disp: , Rfl:  .  ALPRAZolam (XANAX) 0.5 MG tablet, Take 1 tablet (0.5 mg total) by mouth 2 (two) times daily as needed for anxiety., Disp: 60 tablet, Rfl: 2 .  atorvastatin (LIPITOR) 10  MG tablet, Take 10 mg by mouth every morning. , Disp: , Rfl:  .  cetirizine (ZYRTEC) 10 MG tablet, Take 10 mg by mouth at bedtime., Disp: , Rfl: 12 .  DULoxetine (CYMBALTA) 60 MG capsule, Take 1 capsule (60 mg total) by mouth 2 (two) times daily., Disp: 60 capsule, Rfl: 2 .  exemestane (AROMASIN) 25 MG tablet, Take 1 tablet (25 mg total) by mouth daily after breakfast., Disp: 90 tablet, Rfl: 4 .  lamoTRIgine (LAMICTAL) 100 MG tablet, Take 1 tablet (100 mg total) by mouth at bedtime., Disp: 30 tablet, Rfl: 2 .  OLANZapine (ZYPREXA) 5 MG tablet, Take 1 tablet (  5 mg total) by mouth at bedtime., Disp: 30 tablet, Rfl: 2 .  pantoprazole (PROTONIX) 40 MG tablet, Take 40 mg by mouth daily as needed (for acid reflux). , Disp: , Rfl:  .  topiramate (TOPAMAX) 100 MG tablet, Take 100 mg by mouth daily. , Disp: , Rfl:  .  topiramate (TOPAMAX) 50 MG tablet, Take 50 mg by mouth daily., Disp: , Rfl:  .  vitamin B-12 (CYANOCOBALAMIN) 1000 MCG tablet, Take 1,000 mcg by mouth daily. , Disp: , Rfl:   Allergies Codeine  Review of Systems Review of Systems - Oncology ROS negative other than laryngitis and right salivary gland infection   Physical Exam  Vitals Wt Readings from Last 3 Encounters:  06/09/18 149 lb 6.4 oz (67.8 kg)  12/10/17 153 lb 1.6 oz (69.4 kg)  05/01/17 (P) 156 lb (70.8 kg)   Temp Readings from Last 3 Encounters:  06/09/18 98.3 F (36.8 C) (Oral)  05/01/17 (P) 98 F (36.7 C) ((P) Oral)  10/29/16 97.9 F (36.6 C) (Oral)   BP Readings from Last 3 Encounters:  06/09/18 130/70  12/10/17 135/82  05/01/17 (P) 138/82   Pulse Readings from Last 3 Encounters:  06/09/18 86  12/10/17 87  05/01/17 (P) 92   Constitutional: Well-developed, well-nourished, and in no distress.   HENT: Head: Normocephalic and atraumatic.  Mouth/Throat: No oropharyngeal exudate. Mucosa moist.Swlling noted on right side of face in mandibular area.   Eyes: Pupils are equal, round, and reactive to light.  Conjunctivae are normal. No scleral icterus.  Neck: Normal range of motion. Neck supple. No JVD present.  Cardiovascular: Normal rate, regular rhythm and normal heart sounds.  Exam reveals no gallop and no friction rub.   No murmur heard. Pulmonary/Chest: Effort normal and breath sounds normal. No respiratory distress. No wheezes.No rales.  Abdominal: Soft. Bowel sounds are normal. No distension. There is no tenderness. There is no guarding.  Musculoskeletal: No edema or tenderness.  Lymphadenopathy: No cervical, axillary or supraclavicular adenopathy.  Neurological: Alert and oriented to person, place, and time. No cranial nerve deficit.  Skin: Skin is warm and dry. No rash noted. No erythema. No pallor.  Psychiatric: Affect and judgment normal.  Breast exam:  Chaperone present.  Left mastectomy with reconstruction.  Right breast shows no dominant palpable masses.    Labs No visits with results within 3 Day(s) from this visit.  Latest known visit with results is:  Office Visit on 04/01/2016  Component Date Value Ref Range Status  . WBC 04/01/2016 6.4  4.0 - 10.5 K/uL Final  . RBC 04/01/2016 3.97  3.87 - 5.11 MIL/uL Final  . Hemoglobin 04/01/2016 10.8* 12.0 - 15.0 g/dL Final  . HCT 04/01/2016 33.9* 36.0 - 46.0 % Final  . MCV 04/01/2016 85.4  78.0 - 100.0 fL Final  . MCH 04/01/2016 27.2  26.0 - 34.0 pg Final  . MCHC 04/01/2016 31.9  30.0 - 36.0 g/dL Final  . RDW 04/01/2016 15.0  11.5 - 15.5 % Final  . Platelets 04/01/2016 245  150 - 400 K/uL Final  . Neutrophils Relative % 04/01/2016 64  % Final  . Neutro Abs 04/01/2016 4.0  1.7 - 7.7 K/uL Final  . Lymphocytes Relative 04/01/2016 24  % Final  . Lymphs Abs 04/01/2016 1.5  0.7 - 4.0 K/uL Final  . Monocytes Relative 04/01/2016 9  % Final  . Monocytes Absolute 04/01/2016 0.6  0.1 - 1.0 K/uL Final  . Eosinophils Relative 04/01/2016 3  % Final  .  Eosinophils Absolute 04/01/2016 0.2  0.0 - 0.7 K/uL Final  . Basophils Relative 04/01/2016 0   % Final  . Basophils Absolute 04/01/2016 0.0  0.0 - 0.1 K/uL Final  . Sodium 04/01/2016 137  135 - 145 mmol/L Final  . Potassium 04/01/2016 4.0  3.5 - 5.1 mmol/L Final  . Chloride 04/01/2016 109  101 - 111 mmol/L Final  . CO2 04/01/2016 22  22 - 32 mmol/L Final  . Glucose, Bld 04/01/2016 91  65 - 99 mg/dL Final  . BUN 04/01/2016 24* 6 - 20 mg/dL Final  . Creatinine, Ser 04/01/2016 1.60* 0.44 - 1.00 mg/dL Final  . Calcium 04/01/2016 9.8  8.9 - 10.3 mg/dL Final  . Total Protein 04/01/2016 8.6* 6.5 - 8.1 g/dL Final  . Albumin 04/01/2016 3.9  3.5 - 5.0 g/dL Final  . AST 04/01/2016 17  15 - 41 U/L Final  . ALT 04/01/2016 16  14 - 54 U/L Final  . Alkaline Phosphatase 04/01/2016 66  38 - 126 U/L Final  . Total Bilirubin 04/01/2016 0.4  0.3 - 1.2 mg/dL Final  . GFR calc non Af Amer 04/01/2016 33* >60 mL/min Final  . GFR calc Af Amer 04/01/2016 38* >60 mL/min Final   Comment: (NOTE) The eGFR has been calculated using the CKD EPI equation. This calculation has not been validated in all clinical situations. eGFR's persistently <60 mL/min signify possible Chronic Kidney Disease.   . Anion gap 04/01/2016 6  5 - 15 Final  . SSA ([place]) (ENA) Antibody, IgG 04/01/2016 >8.0* 0.0 - 0.9 AI Final  . SSB ([place]) (ENA) Antibody, IgG 04/01/2016 >8.0* 0.0 - 0.9 AI Final   Comment: (NOTE) Performed At: [facility] [address] [place], [place] [id number] Lindon Romp MD OZ:[id number]      Pathology Orders Placed This Encounter  Procedures  . MM Digital Screening Unilat R    Standing Status:   Future    Standing Expiration Date:   06/09/2019    Order Specific Question:   Reason for Exam (SYMPTOM  OR DIAGNOSIS REQUIRED)    Answer:   left breast cancer    Order Specific Question:   Preferred imaging location?    Answer:   [facility]       Zoila Shutter MD

## 2018-06-09 NOTE — Patient Instructions (Signed)
[place] at [facility]  Discharge Instructions:  You saw Dr. Walden Field today. _______________________________________________________________  Thank you for choosing [place] at [facility] to provide your oncology and hematology care.  To afford each patient quality time with our providers, please arrive at least 15 minutes before your scheduled appointment.  You need to re-schedule your appointment if you arrive 10 or more minutes late.  We strive to give you quality time with our providers, and arriving late affects you and other patients whose appointments are after yours.  Also, if you no show three or more times for appointments you may be dismissed from the clinic.  Again, thank you for choosing [place] at [place] hope is that these requests will allow you access to exceptional care and in a timely manner. _______________________________________________________________  If you have questions after your visit, please contact our office at [phone] between the hours of 8:30 a.m. and 5:00 p.m. Voicemails left after 4:30 p.m. will not be returned until the following business day. _______________________________________________________________  For prescription refill requests, have your pharmacy contact our office. _______________________________________________________________  Recommendations made by the consultant and any test results will be sent to your referring physician. _______________________________________________________________

## 2018-06-10 ENCOUNTER — Ambulatory Visit (HOSPITAL_COMMUNITY): Payer: Self-pay | Admitting: Internal Medicine

## 2018-06-21 ENCOUNTER — Other Ambulatory Visit (HOSPITAL_COMMUNITY): Payer: Self-pay | Admitting: Psychiatry

## 2018-06-21 DIAGNOSIS — I10 Essential (primary) hypertension: Secondary | ICD-10-CM | POA: Diagnosis not present

## 2018-06-21 DIAGNOSIS — E7800 Pure hypercholesterolemia, unspecified: Secondary | ICD-10-CM | POA: Diagnosis not present

## 2018-06-21 DIAGNOSIS — F329 Major depressive disorder, single episode, unspecified: Secondary | ICD-10-CM | POA: Diagnosis not present

## 2018-07-01 DIAGNOSIS — M3500 Sicca syndrome, unspecified: Secondary | ICD-10-CM | POA: Diagnosis not present

## 2018-07-01 DIAGNOSIS — Z6829 Body mass index (BMI) 29.0-29.9, adult: Secondary | ICD-10-CM | POA: Diagnosis not present

## 2018-07-01 DIAGNOSIS — C50919 Malignant neoplasm of unspecified site of unspecified female breast: Secondary | ICD-10-CM | POA: Diagnosis not present

## 2018-07-01 DIAGNOSIS — Z299 Encounter for prophylactic measures, unspecified: Secondary | ICD-10-CM | POA: Diagnosis not present

## 2018-07-01 DIAGNOSIS — I10 Essential (primary) hypertension: Secondary | ICD-10-CM | POA: Diagnosis not present

## 2018-07-05 ENCOUNTER — Encounter (HOSPITAL_COMMUNITY): Payer: Self-pay | Admitting: Psychiatry

## 2018-07-05 ENCOUNTER — Ambulatory Visit (HOSPITAL_COMMUNITY): Payer: Medicare HMO | Admitting: Psychiatry

## 2018-07-05 VITALS — BP 137/86 | HR 87 | Ht 60.0 in | Wt 149.8 lb

## 2018-07-05 DIAGNOSIS — F331 Major depressive disorder, recurrent, moderate: Secondary | ICD-10-CM

## 2018-07-05 MED ORDER — LAMOTRIGINE 100 MG PO TABS: 100.0000 mg | ORAL_TABLET | Freq: Every day | ORAL | 2 refills | Status: DC

## 2018-07-05 MED ORDER — DULOXETINE HCL 60 MG PO CPEP: 60.0000 mg | ORAL_CAPSULE | Freq: Two times a day (BID) | ORAL | 2 refills | Status: DC

## 2018-07-05 MED ORDER — OLANZAPINE 5 MG PO TABS: 5.0000 mg | ORAL_TABLET | Freq: Every day | ORAL | 2 refills | Status: DC

## 2018-07-05 MED ORDER — ALPRAZOLAM 0.5 MG PO TABS: 0.5000 mg | ORAL_TABLET | Freq: Two times a day (BID) | ORAL | 2 refills | Status: DC | PRN

## 2018-07-05 NOTE — Progress Notes (Signed)
BH MD/PA/NP OP Progress Note  07/05/2018 2:56 PM Caitlin [name]  MRN:  [id number]  Chief Complaint:  Chief Complaint    Depression; Anxiety; Follow-up     HPI: patient is a 68 year old married white female who lives with her husband in [place]. She has 4 children and 6 grandchildren. She worked in a [employer] until they let her go in 2005. She is self-referred.  The patient states that her depression started in the late 90s. She had to have an emergency colostomy due to diverticulosis. She had complications from the surgery and had to have a hernia repair and several other surgeries on her abdomen. She is left with a huge scar which lowered her self-confidence and self-esteem. She also suffers from migraine headaches.  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 Since then she's really gone downhill. She's become increasingly depressed and anxious. She's been seen at [facility] since 2009 but feels like the most recent psychiatrist does not listen to her and changed all the medications that were helpful. Currently she has lots of headaches, she's tired all the time and doesn't leave the house. She feels like she is a burden to everyone around her and has passive suicidal ideation but no plan. She sleeps okay her appetite has diminished.  In 2011 she was admitted to [facility] because she was depressed and psychotic. She thinks this was a reaction to a medication that she got from migraine headache. She doesn't recall the name of the medicine. She was not hospitalized since then has had little therapy  The patient and husband return after 4 months.  They tell me that there his son who had esophageal cancer died on July 09, 2023.  He had been in a lot of pain and was on morphine at the end through hospice care.  The patient is doing fairly well because she realized her son lived a good life.  He was a Company secretary and even planned out every detail of his funeral so his  wife did not have to deal with it.  She is still very sad but so far has been handling it fairly well.  She is eating and sleeping well.  She usually only uses 1 Xanax a day but sometimes needs a second 1.  She denies serious depression or suicidal ideation. Visit Diagnosis:    ICD-10-CM   1. Major depressive disorder, recurrent episode, moderate (HCC) F33.1     Past Psychiatric History: 1 past psychiatric hospitalization, primarily outpatient treatment for depression  Past Medical History:  Past Medical History:  Diagnosis Date  . Anemia many yrs ago   not on any meds  . Anxiety    takes Xanax daily as needed  . Breast cancer ([place])   . Cataracts, bilateral    immature  . Depression    takes Cymbalta daily  . Diverticulitis   . GERD (gastroesophageal reflux disease)    takes Pantoprazole daily as needed  . Headache(784.0)    takes Topamax nightly;last migraine has been a while  . History of blood transfusion    no abnormal reaction noted  . History of bronchitis around 2005  . History of kidney stones   . History of shingles   . Hyperlipidemia    takes Atorvastatin daily  . Hypertension    takes Losaratn daily  . Insomnia    takes Zyprexa nightly  . Pneumonia around 2005    Past Surgical History:  Procedure Laterality Date  . BREAST RECONSTRUCTION WITH PLACEMENT OF TISSUE EXPANDER AND FLEX HD (ACELLULAR HYDRATED DERMIS) Left 02/10/2014   Procedure: LEFT BREAST RECONSTRUCTION WITH PLACEMENT OF TISSUE EXPANDER AND  FLEX HD (ACELLULAR HYDRATED DERMIS);  Surgeon: Crissie Reese, MD;  Location: [facility];  Service: Plastics;  Laterality: Left;  . COLON SURGERY     d/t diverticulitis  . colostomy take down    . ESOPHAGOGASTRODUODENOSCOPY    . HERNIA REPAIR  2014  . INCISIONAL HERNIA REPAIR    . MASTECTOMY Left   . R kidney tumor removed    . SIMPLE MASTECTOMY WITH AXILLARY SENTINEL NODE BIOPSY Left 02/10/2014   Procedure: LEFT TOTAL MASTECTOMY WITH AXILLARY SENTINEL NODE BIOPSY;   Surgeon: Edward Jolly, MD;  Location: [facility];  Service: General;  Laterality: Left;  . TONSILLECTOMY    . TUBAL LIGATION      Family Psychiatric History: See below  Family History:  Family History  Problem Relation Age of Onset  . Depression Mother   . Alcohol abuse Father     Social History:  Social History   Socioeconomic History  . Marital status: Married    Spouse name: Not on file  . Number of children: Not on file  . Years of education: Not on file  . Highest education level: Not on file  Occupational History  . Not on file  Social Needs  . Financial resource strain: Not on file  . Food insecurity:    Worry: Not on file    Inability: Not on file  . Transportation needs:    Medical: Not on file    Non-medical: Not on file  Tobacco Use  . Smoking status: Former Research scientist (life sciences)  . Smokeless tobacco: Never Used  . Tobacco comment: quit smoking almost 3 yrs ago  Substance and Sexual Activity  . Alcohol use: No  . Drug use: No  . Sexual activity: Not Currently    Birth control/protection: Post-menopausal  Lifestyle  . Physical activity:    Days per week: Not on file    Minutes per session: Not on file  . Stress: Not on file  Relationships  . Social connections:    Talks on phone: Not on file    Gets together: Not on file    Attends religious service: Not on file    Active member of club or organization: Not on file    Attends meetings of clubs or organizations: Not on file    Relationship status: Not on file  Other Topics Concern  . Not on file  Social History Narrative  . Not on file    Allergies:  Allergies  Allergen Reactions  . Codeine     Nausea/vomiting    Metabolic Disorder Labs: No results found for: HGBA1C, MPG No results found for: PROLACTIN No results found for: CHOL, TRIG, HDL, CHOLHDL, VLDL, LDLCALC No results found for: TSH  Therapeutic Level Labs: No results found for: LITHIUM No results found for: VALPROATE No components found  for:  CBMZ  Current Medications: Current Outpatient Medications  Medication Sig Dispense Refill  . ALPRAZolam (XANAX) 0.5 MG tablet Take 1 tablet (0.5 mg total) by mouth 2 (two) times daily as needed. for anxiety 60 tablet 2  . atorvastatin (LIPITOR) 10 MG tablet Take 10 mg by mouth every morning.     . cetirizine (ZYRTEC) 10 MG tablet Take 10 mg by mouth at bedtime.  12  . DULoxetine (CYMBALTA) 60 MG capsule Take 1 capsule (  60 mg total) by mouth 2 (two) times daily. 60 capsule 2  . exemestane (AROMASIN) 25 MG tablet Take 1 tablet (25 mg total) by mouth daily after breakfast. 90 tablet 4  . lamoTRIgine (LAMICTAL) 100 MG tablet Take 1 tablet (100 mg total) by mouth at bedtime. 30 tablet 2  . losartan (COZAAR) 50 MG tablet Take 50 mg by mouth every morning. Take 1/2 tablet 25 mg    . OLANZapine (ZYPREXA) 5 MG tablet Take 1 tablet (5 mg total) by mouth at bedtime. 30 tablet 2  . pantoprazole (PROTONIX) 40 MG tablet Take 40 mg by mouth daily as needed (for acid reflux).     . topiramate (TOPAMAX) 100 MG tablet Take 100 mg by mouth daily.     Marland Kitchen topiramate (TOPAMAX) 50 MG tablet Take 50 mg by mouth daily.    . vitamin B-12 (CYANOCOBALAMIN) 1000 MCG tablet Take 1,000 mcg by mouth daily.      No current facility-administered medications for this visit.      Musculoskeletal: Strength & Muscle Tone: within normal limits Gait & Station: normal Patient leans: N/A  Psychiatric Specialty Exam: Review of Systems  Constitutional: Positive for malaise/fatigue.  Neurological: Positive for headaches.  All other systems reviewed and are negative.   Blood pressure 137/86, pulse 87, height 5' (1.524 m), weight 149 lb 12.8 oz (67.9 kg), SpO2 100 %.Body mass index is 29.26 kg/m.  General Appearance: Casual and Fairly Groomed  Eye Contact:  Good  Speech:  Clear and Coherent  Volume:  Normal  Mood:  Dysphoric  Affect:  Constricted and Flat  Thought Process:  Goal Directed  Orientation:  Full (Time,  Place, and Person)  Thought Content: Rumination   Suicidal Thoughts:  No  Homicidal Thoughts:  No  Memory:  Immediate;   Good Recent;   Good Remote;   Fair  Judgement:  Fair  Insight:  Fair  Psychomotor Activity:  Decreased  Concentration:  Concentration: Fair and Attention Span: Fair  Recall:  [employer] of Knowledge: Fair  Language: Good  Akathisia:  No  Handed:  Right  AIMS (if indicated): not done  Assets:  Communication Skills Desire for Improvement Resilience Social Support Talents/Skills  ADL's:  Intact  Cognition: WNL  Sleep:  Good   Screenings:   Assessment and Plan: This patient is a 68 year old female with a history of depression and anxiety.  Right now she is more dysphoric because of her son's recent death but overall she is handling things well.  She will continue Cymbalta 60 mg twice daily for depression, Lamictal 100 mg at bedtime for mood stabilization as well as olanzapine 5 mg at bedtime also for mood stabilization and Xanax 0.5 mg twice daily as needed for anxiety.  She will return to see me in 3 months   [name], MD 07/05/2018, 2:56 PM

## 2018-07-19 DIAGNOSIS — E7800 Pure hypercholesterolemia, unspecified: Secondary | ICD-10-CM | POA: Diagnosis not present

## 2018-07-19 DIAGNOSIS — F329 Major depressive disorder, single episode, unspecified: Secondary | ICD-10-CM | POA: Diagnosis not present

## 2018-07-19 DIAGNOSIS — I10 Essential (primary) hypertension: Secondary | ICD-10-CM | POA: Diagnosis not present

## 2018-08-16 DIAGNOSIS — E7800 Pure hypercholesterolemia, unspecified: Secondary | ICD-10-CM | POA: Diagnosis not present

## 2018-08-16 DIAGNOSIS — I10 Essential (primary) hypertension: Secondary | ICD-10-CM | POA: Diagnosis not present

## 2018-08-16 DIAGNOSIS — F329 Major depressive disorder, single episode, unspecified: Secondary | ICD-10-CM | POA: Diagnosis not present

## 2018-09-14 DIAGNOSIS — F329 Major depressive disorder, single episode, unspecified: Secondary | ICD-10-CM | POA: Diagnosis not present

## 2018-09-14 DIAGNOSIS — E7800 Pure hypercholesterolemia, unspecified: Secondary | ICD-10-CM | POA: Diagnosis not present

## 2018-09-14 DIAGNOSIS — I10 Essential (primary) hypertension: Secondary | ICD-10-CM | POA: Diagnosis not present

## 2018-10-04 ENCOUNTER — Ambulatory Visit (INDEPENDENT_AMBULATORY_CARE_PROVIDER_SITE_OTHER): Payer: Medicare HMO | Admitting: Psychiatry

## 2018-10-04 ENCOUNTER — Encounter (HOSPITAL_COMMUNITY): Payer: Self-pay | Admitting: Psychiatry

## 2018-10-04 ENCOUNTER — Ambulatory Visit (HOSPITAL_COMMUNITY): Payer: Medicare HMO | Admitting: Psychiatry

## 2018-10-04 ENCOUNTER — Other Ambulatory Visit: Payer: Self-pay

## 2018-10-04 DIAGNOSIS — Z79899 Other long term (current) drug therapy: Secondary | ICD-10-CM

## 2018-10-04 DIAGNOSIS — F331 Major depressive disorder, recurrent, moderate: Secondary | ICD-10-CM

## 2018-10-04 MED ORDER — LAMOTRIGINE 100 MG PO TABS: 100.0000 mg | ORAL_TABLET | Freq: Every day | ORAL | 2 refills | Status: DC

## 2018-10-04 MED ORDER — ALPRAZOLAM 0.5 MG PO TABS: 0.5000 mg | ORAL_TABLET | Freq: Two times a day (BID) | ORAL | 2 refills | Status: DC | PRN

## 2018-10-04 MED ORDER — OLANZAPINE 5 MG PO TABS: 5.0000 mg | ORAL_TABLET | Freq: Every day | ORAL | 2 refills | Status: DC

## 2018-10-04 MED ORDER — DULOXETINE HCL 60 MG PO CPEP: 60.0000 mg | ORAL_CAPSULE | Freq: Every day | ORAL | 2 refills | Status: DC

## 2018-10-04 MED ORDER — VORTIOXETINE HBR 5 MG PO TABS: ORAL_TABLET | ORAL | 2 refills | Status: DC

## 2018-10-04 NOTE — Progress Notes (Signed)
BH MD/PA/NP OP Progress Note  10/04/2018 2:07 PM Caitlin [name]  MRN:  [id number]  Chief Complaint:  Chief Complaint    Depression; Anxiety; Follow-up     HPI: patient is a 68 year old married white female who lives with her husband in [place]. She has 4 children and 6 grandchildren. She worked in a [employer] until they let her go in 2005. She is self-referred.  The patient states that her depression started in the late 90s. She had to have an emergency colostomy due to diverticulosis. She had complications from the surgery and had to have a hernia repair and several other surgeries on her abdomen. She is left with a huge scar which lowered her self-confidence and self-esteem. She also suffers from migraine headaches.  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 Since then she's really gone downhill. She's become increasingly depressed and anxious. She's been seen at [facility] since 2009 but feels like the most recent psychiatrist does not listen to her and changed all the medications that were helpful. Currently she has lots of headaches, she's tired all the time and doesn't leave the house. She feels like she is a burden to everyone around her and has passive suicidal ideation but no plan. She sleeps okay her appetite has diminished.  In 2011 she was admitted to [facility] because she was depressed and psychotic. She thinks this was a reaction to a medication that she got from migraine headache. She doesn't recall the name of the medicine. She was not hospitalized since then has had little therapy  The patient returns for follow-up after 3 months via telephone visit due to the coronavirus pandemic.  She has not been doing well.  She was not doing very much before but now since the stay at home order she is just sitting around the house watching TV.  She is not even changing out of her night close.  She cannot think of any hobbies or things she wants to do  and she is not going outdoors at all.  She states that she is depressed and sad and sometimes has passive suicidal ideation but would not act on it.  I talked to both she and her husband about adding some Trintellix to see if this will help her depression.  I also encouraged her to make a schedule set a goal for the day to try to find something to do even if is just cleaning up a little bit taking a shower and getting dressed for the day and at least sitting outdoors for 20 minutes to get sunlight.  She agrees to try     Visit Diagnosis:    ICD-10-CM   1. Major depressive disorder, recurrent episode, moderate (HCC) F33.1     Past Psychiatric History: 1 past psychiatric hospitalization, primarily outpatient treatment for depression  Past Medical History:  Past Medical History:  Diagnosis Date  . Anemia many yrs ago   not on any meds  . Anxiety    takes Xanax daily as needed  . Breast cancer ([place])   . Cataracts, bilateral    immature  . Depression    takes Cymbalta daily  . Diverticulitis   . GERD (gastroesophageal reflux disease)    takes Pantoprazole daily as needed  . Headache(784.0)    takes Topamax nightly;last migraine has been a while  . History of blood transfusion    no abnormal reaction noted  . History of bronchitis  around 2005  . History of kidney stones   . History of shingles   . Hyperlipidemia    takes Atorvastatin daily  . Hypertension    takes Losaratn daily  . Insomnia    takes Zyprexa nightly  . Pneumonia around 2005    Past Surgical History:  Procedure Laterality Date  . BREAST RECONSTRUCTION WITH PLACEMENT OF TISSUE EXPANDER AND FLEX HD (ACELLULAR HYDRATED DERMIS) Left 02/10/2014   Procedure: LEFT BREAST RECONSTRUCTION WITH PLACEMENT OF TISSUE EXPANDER AND  FLEX HD (ACELLULAR HYDRATED DERMIS);  Surgeon: Crissie Reese, MD;  Location: [facility];  Service: Plastics;  Laterality: Left;  . COLON SURGERY     d/t diverticulitis  . colostomy take down    .  ESOPHAGOGASTRODUODENOSCOPY    . HERNIA REPAIR  2014  . INCISIONAL HERNIA REPAIR    . MASTECTOMY Left   . R kidney tumor removed    . SIMPLE MASTECTOMY WITH AXILLARY SENTINEL NODE BIOPSY Left 02/10/2014   Procedure: LEFT TOTAL MASTECTOMY WITH AXILLARY SENTINEL NODE BIOPSY;  Surgeon: Edward Jolly, MD;  Location: [facility];  Service: General;  Laterality: Left;  . TONSILLECTOMY    . TUBAL LIGATION      Family Psychiatric History: see below  Family History:  Family History  Problem Relation Age of Onset  . Depression Mother   . Alcohol abuse Father     Social History:  Social History   Socioeconomic History  . Marital status: Married    Spouse name: Not on file  . Number of children: Not on file  . Years of education: Not on file  . Highest education level: Not on file  Occupational History  . Not on file  Social Needs  . Financial resource strain: Not on file  . Food insecurity:    Worry: Not on file    Inability: Not on file  . Transportation needs:    Medical: Not on file    Non-medical: Not on file  Tobacco Use  . Smoking status: Former Research scientist (life sciences)  . Smokeless tobacco: Never Used  . Tobacco comment: quit smoking almost 3 yrs ago  Substance and Sexual Activity  . Alcohol use: No  . Drug use: No  . Sexual activity: Not Currently    Birth control/protection: Post-menopausal  Lifestyle  . Physical activity:    Days per week: Not on file    Minutes per session: Not on file  . Stress: Not on file  Relationships  . Social connections:    Talks on phone: Not on file    Gets together: Not on file    Attends religious service: Not on file    Active member of club or organization: Not on file    Attends meetings of clubs or organizations: Not on file    Relationship status: Not on file  Other Topics Concern  . Not on file  Social History Narrative  . Not on file    Allergies:  Allergies  Allergen Reactions  . Codeine     Nausea/vomiting    Metabolic  Disorder Labs: No results found for: HGBA1C, MPG No results found for: PROLACTIN No results found for: CHOL, TRIG, HDL, CHOLHDL, VLDL, LDLCALC No results found for: TSH  Therapeutic Level Labs: No results found for: LITHIUM No results found for: VALPROATE No components found for:  CBMZ  Current Medications: Current Outpatient Medications  Medication Sig Dispense Refill  . ALPRAZolam (XANAX) 0.5 MG tablet Take 1 tablet (0.5 mg total) by mouth  2 (two) times daily as needed. for anxiety 60 tablet 2  . atorvastatin (LIPITOR) 10 MG tablet Take 10 mg by mouth every morning.     . cetirizine (ZYRTEC) 10 MG tablet Take 10 mg by mouth at bedtime.  12  . DULoxetine (CYMBALTA) 60 MG capsule Take 1 capsule (60 mg total) by mouth daily. 30 capsule 2  . exemestane (AROMASIN) 25 MG tablet Take 1 tablet (25 mg total) by mouth daily after breakfast. 90 tablet 4  . lamoTRIgine (LAMICTAL) 100 MG tablet Take 1 tablet (100 mg total) by mouth at bedtime. 30 tablet 2  . losartan (COZAAR) 50 MG tablet Take 50 mg by mouth every morning. Take 1/2 tablet 25 mg    . OLANZapine (ZYPREXA) 5 MG tablet Take 1 tablet (5 mg total) by mouth at bedtime. 30 tablet 2  . pantoprazole (PROTONIX) 40 MG tablet Take 40 mg by mouth daily as needed (for acid reflux).     . topiramate (TOPAMAX) 100 MG tablet Take 100 mg by mouth daily.     Marland Kitchen topiramate (TOPAMAX) 50 MG tablet Take 50 mg by mouth daily.    . vitamin B-12 (CYANOCOBALAMIN) 1000 MCG tablet Take 1,000 mcg by mouth daily.     Marland Kitchen vortioxetine HBr (TRINTELLIX) 5 MG TABS tablet Take one daily for 2 weeks then increase to 2 daily. TAKE WITH FOOD 60 tablet 2   No current facility-administered medications for this visit.      Musculoskeletal: Strength & Muscle Tone: Not done, telephone visit Gait & Station:  Patient leans:   Psychiatric Specialty Exam: Review of Systems  Constitutional: Positive for malaise/fatigue.  Psychiatric/Behavioral: Positive for depression.   All other systems reviewed and are negative.   There were no vitals taken for this visit.There is no height or weight on file to calculate BMI.  General Appearance: NA  Eye Contact:  NA  Speech:  Clear and Coherent  Volume:  Decreased  Mood:  Depressed and Hopeless  Affect:  NA  Thought Process:  Goal Directed  Orientation:  Full (Time, Place, and Person)  Thought Content: Rumination   Suicidal Thoughts:  Yes.  without intent/plan  Homicidal Thoughts:  No  Memory:  Immediate;   Good Recent;   Good Remote;   Fair  Judgement:  Poor  Insight:  Shallow  Psychomotor Activity:  Decreased  Concentration:  Concentration: Fair and Attention Span: Fair  Recall:  [employer] of Knowledge: Fair  Language: Good  Akathisia:  No  Handed:  Right  AIMS (if indicated): not done  Assets:  Communication Skills Desire for Improvement Resilience Social Support Talents/Skills  ADL's:  Intact  Cognition: WNL  Sleep:  Good   Screenings:   Assessment and Plan: This patient is a 69 year old female with a long history of depression which seems to have gotten worse since her son died last 06-12-23 followed by the stay at home order due to coronavirus.  I have urged her to make a schedule and set small dose for the day such as getting dressed and doing one small task and getting outdoors.  She will cut down Cymbalta to 60 mg daily and start Trintellix 5 mg daily for 1 week and then increase to 10 mg daily for depression.  She will continue Lamictal 100 mg at bedtime along with olanzapine 5 mg at bedtime for mood stabilization.  She will continue Xanax 0.5 mg twice daily as needed for anxiety.  She will return to see me  in 4 weeks Virtual Visit via Telephone Note  I connected with Caitlin [name] on 10/04/18 at  1:40 PM EDT by telephone and verified that I am speaking with the correct person using two identifiers.   I discussed the limitations, risks, security and privacy concerns of performing an  evaluation and management service by telephone and the availability of in person appointments. I also discussed with the patient that there may be a patient responsible charge related to this service. The patient expressed understanding and agreed to proceed.      I discussed the assessment and treatment plan with the patient. The patient was provided an opportunity to ask questions and all were answered. The patient agreed with the plan and demonstrated an understanding of the instructions.   The patient was advised to call back or seek an in-person evaluation if the symptoms worsen or if the condition fails to improve as anticipated.  I provided 15 minutes of non-face-to-face time during this encounter.   [name], MD     [name], MD 10/04/2018, 2:07 PM

## 2018-10-06 ENCOUNTER — Telehealth (HOSPITAL_COMMUNITY): Payer: Self-pay | Admitting: *Deleted

## 2018-10-06 NOTE — Telephone Encounter (Signed)
[employer]  APPROVED TRINTELLIX 5 MG TABLET  AUTHORIZATION GOOD UNTIL 06/23/2019

## 2018-10-12 DIAGNOSIS — I10 Essential (primary) hypertension: Secondary | ICD-10-CM | POA: Diagnosis not present

## 2018-10-12 DIAGNOSIS — F329 Major depressive disorder, single episode, unspecified: Secondary | ICD-10-CM | POA: Diagnosis not present

## 2018-10-12 DIAGNOSIS — E7800 Pure hypercholesterolemia, unspecified: Secondary | ICD-10-CM | POA: Diagnosis not present

## 2018-10-27 DIAGNOSIS — E7800 Pure hypercholesterolemia, unspecified: Secondary | ICD-10-CM | POA: Diagnosis not present

## 2018-10-27 DIAGNOSIS — Z1211 Encounter for screening for malignant neoplasm of colon: Secondary | ICD-10-CM | POA: Diagnosis not present

## 2018-10-27 DIAGNOSIS — I10 Essential (primary) hypertension: Secondary | ICD-10-CM | POA: Diagnosis not present

## 2018-10-27 DIAGNOSIS — Z79899 Other long term (current) drug therapy: Secondary | ICD-10-CM | POA: Diagnosis not present

## 2018-10-27 DIAGNOSIS — Z1331 Encounter for screening for depression: Secondary | ICD-10-CM | POA: Diagnosis not present

## 2018-10-27 DIAGNOSIS — Z299 Encounter for prophylactic measures, unspecified: Secondary | ICD-10-CM | POA: Diagnosis not present

## 2018-10-27 DIAGNOSIS — Z0000 Encounter for general adult medical examination without abnormal findings: Secondary | ICD-10-CM | POA: Diagnosis not present

## 2018-10-27 DIAGNOSIS — Z7189 Other specified counseling: Secondary | ICD-10-CM | POA: Diagnosis not present

## 2018-10-27 DIAGNOSIS — R5383 Other fatigue: Secondary | ICD-10-CM | POA: Diagnosis not present

## 2018-10-27 DIAGNOSIS — Z1339 Encounter for screening examination for other mental health and behavioral disorders: Secondary | ICD-10-CM | POA: Diagnosis not present

## 2018-11-01 ENCOUNTER — Encounter (HOSPITAL_COMMUNITY): Payer: Self-pay | Admitting: Psychiatry

## 2018-11-01 ENCOUNTER — Other Ambulatory Visit: Payer: Self-pay

## 2018-11-01 ENCOUNTER — Ambulatory Visit (INDEPENDENT_AMBULATORY_CARE_PROVIDER_SITE_OTHER): Payer: Medicare HMO | Admitting: Psychiatry

## 2018-11-01 DIAGNOSIS — F331 Major depressive disorder, recurrent, moderate: Secondary | ICD-10-CM

## 2018-11-01 MED ORDER — DULOXETINE HCL 60 MG PO CPEP: 60.0000 mg | ORAL_CAPSULE | Freq: Two times a day (BID) | ORAL | 2 refills | Status: DC

## 2018-11-01 MED ORDER — LAMOTRIGINE 100 MG PO TABS: 100.0000 mg | ORAL_TABLET | Freq: Every day | ORAL | 2 refills | Status: DC

## 2018-11-01 MED ORDER — ALPRAZOLAM 0.5 MG PO TABS: 0.5000 mg | ORAL_TABLET | Freq: Two times a day (BID) | ORAL | 2 refills | Status: DC | PRN

## 2018-11-01 MED ORDER — BREXPIPRAZOLE 2 MG PO TABS: 2.0000 mg | ORAL_TABLET | Freq: Every day | ORAL | 2 refills | Status: DC

## 2018-11-01 MED ORDER — OLANZAPINE 5 MG PO TABS: 5.0000 mg | ORAL_TABLET | Freq: Every day | ORAL | 2 refills | Status: DC

## 2018-11-01 NOTE — Progress Notes (Signed)
Virtual Visit via Telephone Note  I connected with Caitlin [name] on 11/01/18 at  2:00 PM EDT by telephone and verified that I am speaking with the correct person using two identifiers.   I discussed the limitations, risks, security and privacy concerns of performing an evaluation and management service by telephone and the availability of in person appointments. I also discussed with the patient that there may be a patient responsible charge related to this service. The patient expressed understanding and agreed to proceed.      I discussed the assessment and treatment plan with the patient. The patient was provided an opportunity to ask questions and all were answered. The patient agreed with the plan and demonstrated an understanding of the instructions.   The patient was advised to call back or seek an in-person evaluation if the symptoms worsen or if the condition fails to improve as anticipated.  I provided 15 minutes of non-face-to-face time during this encounter.   [name], MD  [facility] MD/PA/NP OP Progress Note  11/01/2018 2:23 PM Caitlin [name]  MRN:  [id number]  Chief Complaint:  Chief Complaint    Depression; Anxiety; Fatigue; Follow-up     NWG:NFAOZHY is a 68 year old married white female who lives with her husband in [place]. She has 4 children and 6 grandchildren. She worked in a [employer] until they let her go in 2005. She is self-referred.  The patient states that her depression started in the late 90s. She had to have an emergency colostomy due to diverticulosis. She had complications from the surgery and had to have a hernia repair and several other surgeries on her abdomen. She is left with a huge scar which lowered her self-confidence and self-esteem. She also suffers from migraine headaches.  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 Since then she's really gone downhill. She's become increasingly depressed and anxious. She's been seen  at [facility] since 2009 but feels like the most recent psychiatrist does not listen to her and changed all the medications that were helpful. Currently she has lots of headaches, she's tired all the time and doesn't leave the house. She feels like she is a burden to everyone around her and has passive suicidal ideation but no plan. She sleeps okay her appetite has diminished.  In 2011 she was admitted to [facility] because she was depressed and psychotic. She thinks this was a reaction to a medication that she got from migraine headache. She doesn't recall the name of the medicine. She was not hospitalized since then has had little therapy  The patient returns after 4 weeks.She never got the trintellix due to cost. Still feeling tired, sad and sluggish. She had a good day yesterday because family came to visit.denies suicidal ideation. Discussed trying rexulti for augmentation and she is willing to try it Visit Diagnosis:    ICD-10-CM   1. Major depressive disorder, recurrent episode, moderate (HCC) F33.1     Past Psychiatric History: one past psychiatric hospitalization in the past  Past Medical History:  Past Medical History:  Diagnosis Date  . Anemia many yrs ago   not on any meds  . Anxiety    takes Xanax daily as needed  . Breast cancer ([place])   . Cataracts, bilateral    immature  . Depression    takes Cymbalta daily  . Diverticulitis   . GERD (gastroesophageal reflux disease)    takes Pantoprazole daily as needed  .  Headache(784.0)    takes Topamax nightly;last migraine has been a while  . History of blood transfusion    no abnormal reaction noted  . History of bronchitis around 2005  . History of kidney stones   . History of shingles   . Hyperlipidemia    takes Atorvastatin daily  . Hypertension    takes Losaratn daily  . Insomnia    takes Zyprexa nightly  . Pneumonia around 2005    Past Surgical History:  Procedure Laterality Date  . BREAST RECONSTRUCTION  WITH PLACEMENT OF TISSUE EXPANDER AND FLEX HD (ACELLULAR HYDRATED DERMIS) Left 02/10/2014   Procedure: LEFT BREAST RECONSTRUCTION WITH PLACEMENT OF TISSUE EXPANDER AND  FLEX HD (ACELLULAR HYDRATED DERMIS);  Surgeon: Crissie Reese, MD;  Location: [facility];  Service: Plastics;  Laterality: Left;  . COLON SURGERY     d/t diverticulitis  . colostomy take down    . ESOPHAGOGASTRODUODENOSCOPY    . HERNIA REPAIR  2014  . INCISIONAL HERNIA REPAIR    . MASTECTOMY Left   . R kidney tumor removed    . SIMPLE MASTECTOMY WITH AXILLARY SENTINEL NODE BIOPSY Left 02/10/2014   Procedure: LEFT TOTAL MASTECTOMY WITH AXILLARY SENTINEL NODE BIOPSY;  Surgeon: Edward Jolly, MD;  Location: [facility];  Service: General;  Laterality: Left;  . TONSILLECTOMY    . TUBAL LIGATION      Family Psychiatric History: see below  Family History:  Family History  Problem Relation Age of Onset  . Depression Mother   . Alcohol abuse Father     Social History:  Social History   Socioeconomic History  . Marital status: Married    Spouse name: Not on file  . Number of children: Not on file  . Years of education: Not on file  . Highest education level: Not on file  Occupational History  . Not on file  Social Needs  . Financial resource strain: Not on file  . Food insecurity:    Worry: Not on file    Inability: Not on file  . Transportation needs:    Medical: Not on file    Non-medical: Not on file  Tobacco Use  . Smoking status: Former Research scientist (life sciences)  . Smokeless tobacco: Never Used  . Tobacco comment: quit smoking almost 3 yrs ago  Substance and Sexual Activity  . Alcohol use: No  . Drug use: No  . Sexual activity: Not Currently    Birth control/protection: Post-menopausal  Lifestyle  . Physical activity:    Days per week: Not on file    Minutes per session: Not on file  . Stress: Not on file  Relationships  . Social connections:    Talks on phone: Not on file    Gets together: Not on file    Attends  religious service: Not on file    Active member of club or organization: Not on file    Attends meetings of clubs or organizations: Not on file    Relationship status: Not on file  Other Topics Concern  . Not on file  Social History Narrative  . Not on file    Allergies:  Allergies  Allergen Reactions  . Codeine     Nausea/vomiting    Metabolic Disorder Labs: No results found for: HGBA1C, MPG No results found for: PROLACTIN No results found for: CHOL, TRIG, HDL, CHOLHDL, VLDL, LDLCALC No results found for: TSH  Therapeutic Level Labs: No results found for: LITHIUM No results found for: VALPROATE No components  found for:  CBMZ  Current Medications: Current Outpatient Medications  Medication Sig Dispense Refill  . ALPRAZolam (XANAX) 0.5 MG tablet Take 1 tablet (0.5 mg total) by mouth 2 (two) times daily as needed. for anxiety 60 tablet 2  . atorvastatin (LIPITOR) 10 MG tablet Take 10 mg by mouth every morning.     . Brexpiprazole (REXULTI) 2 MG TABS Take 2 mg by mouth daily. 30 tablet 2  . cetirizine (ZYRTEC) 10 MG tablet Take 10 mg by mouth at bedtime.  12  . DULoxetine (CYMBALTA) 60 MG capsule Take 1 capsule (60 mg total) by mouth 2 (two) times daily. 30 capsule 2  . exemestane (AROMASIN) 25 MG tablet Take 1 tablet (25 mg total) by mouth daily after breakfast. 90 tablet 4  . lamoTRIgine (LAMICTAL) 100 MG tablet Take 1 tablet (100 mg total) by mouth at bedtime. 30 tablet 2  . losartan (COZAAR) 50 MG tablet Take 50 mg by mouth every morning. Take 1/2 tablet 25 mg    . OLANZapine (ZYPREXA) 5 MG tablet Take 1 tablet (5 mg total) by mouth at bedtime. 30 tablet 2  . pantoprazole (PROTONIX) 40 MG tablet Take 40 mg by mouth daily as needed (for acid reflux).     . topiramate (TOPAMAX) 100 MG tablet Take 100 mg by mouth daily.     Marland Kitchen topiramate (TOPAMAX) 50 MG tablet Take 50 mg by mouth daily.    . vitamin B-12 (CYANOCOBALAMIN) 1000 MCG tablet Take 1,000 mcg by mouth daily.      No  current facility-administered medications for this visit.      Musculoskeletal: Strength & Muscle Tone: within normal limits Gait & Station: normal Patient leans: N/A  Psychiatric Specialty Exam: Review of Systems  Constitutional: Positive for malaise/fatigue.  Psychiatric/Behavioral: Positive for depression.  All other systems reviewed and are negative.   There were no vitals taken for this visit.There is no height or weight on file to calculate BMI.  General Appearance: NA  Eye Contact:  NA  Speech:  Clear and Coherent  Volume:  Decreased  Mood:  Dysphoric  Affect:  Flat  Thought Process:  Goal Directed  Orientation:  Full (Time, Place, and Person)  Thought Content: WDL   Suicidal Thoughts:  No  Homicidal Thoughts:  No  Memory:  Immediate;   Good Recent;   Fair Remote;   Fair  Judgement:  Fair  Insight:  Shallow  Psychomotor Activity:  Decreased  Concentration:  Concentration: Fair and Attention Span: Fair  Recall:  [employer] of Knowledge: Fair  Language: Good  Akathisia:  No  Handed:  Right  AIMS (if indicated): not done  Assets:  Communication Skills Desire for Improvement Physical Health Resilience Social Support Talents/Skills  ADL's:  Intact  Cognition: WNL  Sleep:  Good   Screenings:   Assessment and Plan:  This patient is a 68 year old female with a history of depression and anxiety.  Her depression has been worse over recent months since her son passed away.  We have tried to add Trintellix but she cannot afford it so we will add Rexulti 2 mg daily for augmentation. She will continue all other medications and return to see me in 6 weeks  [name], MD 11/01/2018, 2:23 PM

## 2018-11-08 DIAGNOSIS — I10 Essential (primary) hypertension: Secondary | ICD-10-CM | POA: Diagnosis not present

## 2018-11-08 DIAGNOSIS — F329 Major depressive disorder, single episode, unspecified: Secondary | ICD-10-CM | POA: Diagnosis not present

## 2018-11-08 DIAGNOSIS — E7800 Pure hypercholesterolemia, unspecified: Secondary | ICD-10-CM | POA: Diagnosis not present

## 2018-12-06 DIAGNOSIS — F329 Major depressive disorder, single episode, unspecified: Secondary | ICD-10-CM | POA: Diagnosis not present

## 2018-12-06 DIAGNOSIS — I10 Essential (primary) hypertension: Secondary | ICD-10-CM | POA: Diagnosis not present

## 2018-12-06 DIAGNOSIS — E7800 Pure hypercholesterolemia, unspecified: Secondary | ICD-10-CM | POA: Diagnosis not present

## 2018-12-13 ENCOUNTER — Ambulatory Visit (HOSPITAL_COMMUNITY)
Admission: RE | Admit: 2018-12-13 | Discharge: 2018-12-13 | Disposition: A | Discharge status: Home / Self Care | Payer: Medicare HMO | Source: Ambulatory Visit | Attending: Internal Medicine | Admitting: Internal Medicine

## 2018-12-13 ENCOUNTER — Other Ambulatory Visit: Payer: Self-pay

## 2018-12-13 DIAGNOSIS — Z170 Estrogen receptor positive status [ER+]: Secondary | ICD-10-CM | POA: Insufficient documentation

## 2018-12-13 DIAGNOSIS — Z1231 Encounter for screening mammogram for malignant neoplasm of breast: Secondary | ICD-10-CM | POA: Insufficient documentation

## 2018-12-13 DIAGNOSIS — C50112 Malignant neoplasm of central portion of left female breast: Secondary | ICD-10-CM | POA: Insufficient documentation

## 2018-12-14 ENCOUNTER — Ambulatory Visit (INDEPENDENT_AMBULATORY_CARE_PROVIDER_SITE_OTHER): Payer: Medicare HMO | Admitting: Psychiatry

## 2018-12-14 ENCOUNTER — Encounter (HOSPITAL_COMMUNITY): Payer: Self-pay | Admitting: Psychiatry

## 2018-12-14 DIAGNOSIS — F331 Major depressive disorder, recurrent, moderate: Secondary | ICD-10-CM

## 2018-12-14 MED ORDER — LAMOTRIGINE 100 MG PO TABS: 100.0000 mg | ORAL_TABLET | Freq: Every day | ORAL | 2 refills | Status: DC

## 2018-12-14 MED ORDER — ALPRAZOLAM 0.5 MG PO TABS: 0.5000 mg | ORAL_TABLET | Freq: Two times a day (BID) | ORAL | 2 refills | Status: DC | PRN

## 2018-12-14 MED ORDER — DULOXETINE HCL 60 MG PO CPEP: 60.0000 mg | ORAL_CAPSULE | Freq: Two times a day (BID) | ORAL | 2 refills | Status: DC

## 2018-12-14 MED ORDER — OLANZAPINE 5 MG PO TABS: 5.0000 mg | ORAL_TABLET | Freq: Every day | ORAL | 2 refills | Status: DC

## 2018-12-14 NOTE — Progress Notes (Signed)
Virtual Visit via Telephone Note  I connected with [name] on 12/14/18 at  1:00 PM EDT by telephone and verified that I am speaking with the correct person using two identifiers.   I discussed the limitations, risks, security and privacy concerns of performing an evaluation and management service by telephone and the availability of in person appointments. I also discussed with the patient that there may be a patient responsible charge related to this service. The patient expressed understanding and agreed to proceed.     I discussed the assessment and treatment plan with the patient. The patient was provided an opportunity to ask questions and all were answered. The patient agreed with the plan and demonstrated an understanding of the instructions.   The patient was advised to call back or seek an in-person evaluation if the symptoms worsen or if the condition fails to improve as anticipated.  I provided 15 minutes of non-face-to-face time during this encounter.   [name], MD  [facility] MD/PA/NP OP Progress Note  12/14/2018 1:16 PM NYX KEADY  MRN:  [id number]  Chief Complaint:  Chief Complaint    Depression; Anxiety; Follow-up     HPI: patient is a 68 year old married white female who lives with her husband in [place]. She has 4 children and 6 grandchildren. She worked in a [employer] until they let her go in 2005. She is self-referred.  The patient states that her depression started in the late 90s. She had to have an emergency colostomy due to diverticulosis. She had complications from the surgery and had to have a hernia repair and several other surgeries on her abdomen. She is left with a huge scar which lowered her self-confidence and self-esteem. She also suffers from migraine headaches.  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 Since then she's really gone downhill. She's become increasingly depressed and anxious. She's been seen at  [facility] since 2009 but feels like the most recent psychiatrist does not listen to her and changed all the medications that were helpful. Currently she has lots of headaches, she's tired all the time and doesn't leave the house. She feels like she is a burden to everyone around her and has passive suicidal ideation but no plan. She sleeps okay her appetite has diminished.  In 2011 she was admitted to [facility] because she was depressed and psychotic. She thinks this was a reaction to a medication that she got from migraine headache. She doesn't recall the name of the medicine. She was not hospitalized since then has had little therapy  The patient returns after 6 weeks and is assessed via phone due to the coronavirus pandemic.  Last time she felt somewhat depressed and sluggish and weak and we added Rexulti to her regimen.  However she stated she did not pick it up because it was too expensive.  For the most part she states she has perked up because she is spending more time with family either in person or talking to them.  She does not sleep well.  She goes to bed at 1130 and usually wakes up at 3 and cannot go back to sleep so she sits in her living room.  She admits however that she takes a lot of naps during the day.  I told her to try to cut down the daytime sleeping and this would help her sleep at night.  I also suggested melatonin or Benadryl and she will  think about these things.  Furthermore she could take 1 of the Xanax is that she wakes up in the middle the night but she states that her husband has all of her medications locked up.  Overall however the patient states her depression is better and her anxiety is under good control.  She denies suicidal ideation Visit Diagnosis:    ICD-10-CM   1. Major depressive disorder, recurrent episode, moderate (HCC)  F33.1     Past Psychiatric History: One psychiatric hospitalization in the past  Past Medical History:  Past Medical History:   Diagnosis Date  . Anemia many yrs ago   not on any meds  . Anxiety    takes Xanax daily as needed  . Breast cancer ([place])   . Cataracts, bilateral    immature  . Depression    takes Cymbalta daily  . Diverticulitis   . GERD (gastroesophageal reflux disease)    takes Pantoprazole daily as needed  . Headache(784.0)    takes Topamax nightly;last migraine has been a while  . History of blood transfusion    no abnormal reaction noted  . History of bronchitis around 2005  . History of kidney stones   . History of shingles   . Hyperlipidemia    takes Atorvastatin daily  . Hypertension    takes Losaratn daily  . Insomnia    takes Zyprexa nightly  . Pneumonia around 2005    Past Surgical History:  Procedure Laterality Date  . BREAST RECONSTRUCTION WITH PLACEMENT OF TISSUE EXPANDER AND FLEX HD (ACELLULAR HYDRATED DERMIS) Left 02/10/2014   Procedure: LEFT BREAST RECONSTRUCTION WITH PLACEMENT OF TISSUE EXPANDER AND  FLEX HD (ACELLULAR HYDRATED DERMIS);  Surgeon: Crissie Reese, MD;  Location: [facility];  Service: Plastics;  Laterality: Left;  . COLON SURGERY     d/t diverticulitis  . colostomy take down    . ESOPHAGOGASTRODUODENOSCOPY    . HERNIA REPAIR  2014  . INCISIONAL HERNIA REPAIR    . MASTECTOMY Left   . R kidney tumor removed    . SIMPLE MASTECTOMY WITH AXILLARY SENTINEL NODE BIOPSY Left 02/10/2014   Procedure: LEFT TOTAL MASTECTOMY WITH AXILLARY SENTINEL NODE BIOPSY;  Surgeon: Edward Jolly, MD;  Location: [facility];  Service: General;  Laterality: Left;  . TONSILLECTOMY    . TUBAL LIGATION      Family Psychiatric History: see below  Family History:  Family History  Problem Relation Age of Onset  . Depression Mother   . Alcohol abuse Father     Social History:  Social History   Socioeconomic History  . Marital status: Married    Spouse name: Not on file  . Number of children: Not on file  . Years of education: Not on file  . Highest education level: Not on file   Occupational History  . Not on file  Social Needs  . Financial resource strain: Not on file  . Food insecurity    Worry: Not on file    Inability: Not on file  . Transportation needs    Medical: Not on file    Non-medical: Not on file  Tobacco Use  . Smoking status: Former Research scientist (life sciences)  . Smokeless tobacco: Never Used  . Tobacco comment: quit smoking almost 3 yrs ago  Substance and Sexual Activity  . Alcohol use: No  . Drug use: No  . Sexual activity: Not Currently    Birth control/protection: Post-menopausal  Lifestyle  . Physical activity    Days per  week: Not on file    Minutes per session: Not on file  . Stress: Not on file  Relationships  . Social Herbalist on phone: Not on file    Gets together: Not on file    Attends religious service: Not on file    Active member of club or organization: Not on file    Attends meetings of clubs or organizations: Not on file    Relationship status: Not on file  Other Topics Concern  . Not on file  Social History Narrative  . Not on file    Allergies:  Allergies  Allergen Reactions  . Codeine     Nausea/vomiting    Metabolic Disorder Labs: No results found for: HGBA1C, MPG No results found for: PROLACTIN No results found for: CHOL, TRIG, HDL, CHOLHDL, VLDL, LDLCALC No results found for: TSH  Therapeutic Level Labs: No results found for: LITHIUM No results found for: VALPROATE No components found for:  CBMZ  Current Medications: Current Outpatient Medications  Medication Sig Dispense Refill  . ALPRAZolam (XANAX) 0.5 MG tablet Take 1 tablet (0.5 mg total) by mouth 2 (two) times daily as needed. for anxiety 60 tablet 2  . atorvastatin (LIPITOR) 10 MG tablet Take 10 mg by mouth every morning.     . cetirizine (ZYRTEC) 10 MG tablet Take 10 mg by mouth at bedtime.  12  . DULoxetine (CYMBALTA) 60 MG capsule Take 1 capsule (60 mg total) by mouth 2 (two) times daily. 30 capsule 2  . exemestane (AROMASIN) 25 MG tablet  Take 1 tablet (25 mg total) by mouth daily after breakfast. 90 tablet 4  . lamoTRIgine (LAMICTAL) 100 MG tablet Take 1 tablet (100 mg total) by mouth at bedtime. 30 tablet 2  . losartan (COZAAR) 50 MG tablet Take 50 mg by mouth every morning. Take 1/2 tablet 25 mg    . OLANZapine (ZYPREXA) 5 MG tablet Take 1 tablet (5 mg total) by mouth at bedtime. 30 tablet 2  . pantoprazole (PROTONIX) 40 MG tablet Take 40 mg by mouth daily as needed (for acid reflux).     . topiramate (TOPAMAX) 100 MG tablet Take 100 mg by mouth daily.     Marland Kitchen topiramate (TOPAMAX) 50 MG tablet Take 50 mg by mouth daily.    . vitamin B-12 (CYANOCOBALAMIN) 1000 MCG tablet Take 1,000 mcg by mouth daily.      No current facility-administered medications for this visit.      Musculoskeletal: Strength & Muscle Tone: within normal limits Gait & Station: normal Patient leans: N/A  Psychiatric Specialty Exam: Review of Systems  Psychiatric/Behavioral: The patient has insomnia.   All other systems reviewed and are negative.   There were no vitals taken for this visit.There is no height or weight on file to calculate BMI.  General Appearance: NA  Eye Contact:  NA  Speech:  Clear and Coherent  Volume:  Normal  Mood:  Euthymic  Affect:  NA  Thought Process:  Goal Directed  Orientation:  Full (Time, Place, and Person)  Thought Content: WDL   Suicidal Thoughts:  No  Homicidal Thoughts:  No  Memory:  Immediate;   Good Recent;   Good Remote;   Fair  Judgement:  Fair  Insight:  Fair  Psychomotor Activity:  Decreased  Concentration:  Concentration: Fair and Attention Span: Fair  Recall:  Good  Fund of Knowledge: Poor  Language: Good  Akathisia:  No  Handed:  Right  AIMS (  if indicated): not done  Assets:  Communication Skills Desire for Improvement Resilience Social Support Talents/Skills  ADL's:  Intact  Cognition: WNL  Sleep:  Poor   Screenings:   Assessment and Plan: This patient is a 68 year old female  with a history of depression and anxiety.  Currently she is not staying asleep through the night.  I have made numerous suggestions such as melatonin Benadryl taking Xanax in the middle the night etc. but she does not seem too keen on following through.  I also strongly suggested that she not sleep during the day.  For now she will continue Xanax 0.5 mg twice daily as needed for anxiety, Cymbalta 60 mg twice daily for depression, Lamictal 100 mg at bedtime for mood stabilization and olanzapine 5 mg at bedtime for mood stabilization.  She will return to see me in 3 months   [name], MD 12/14/2018, 1:16 PM

## 2018-12-16 ENCOUNTER — Other Ambulatory Visit (HOSPITAL_COMMUNITY): Payer: Self-pay | Admitting: Psychiatry

## 2018-12-16 NOTE — Progress Notes (Signed)
For review.  Please update ordering provider

## 2018-12-17 ENCOUNTER — Inpatient Hospital Stay (HOSPITAL_COMMUNITY): Payer: Medicare HMO | Attending: Nurse Practitioner | Admitting: Nurse Practitioner

## 2018-12-17 ENCOUNTER — Other Ambulatory Visit: Payer: Self-pay

## 2018-12-17 ENCOUNTER — Ambulatory Visit (HOSPITAL_COMMUNITY): Payer: Medicare HMO | Admitting: Nurse Practitioner

## 2018-12-17 DIAGNOSIS — C50112 Malignant neoplasm of central portion of left female breast: Secondary | ICD-10-CM | POA: Insufficient documentation

## 2018-12-17 DIAGNOSIS — Z170 Estrogen receptor positive status [ER+]: Secondary | ICD-10-CM

## 2018-12-17 DIAGNOSIS — N189 Chronic kidney disease, unspecified: Secondary | ICD-10-CM | POA: Insufficient documentation

## 2018-12-17 MED ORDER — EXEMESTANE 25 MG PO TABS: 25.0000 mg | ORAL_TABLET | Freq: Every day | ORAL | 4 refills | Status: DC

## 2018-12-17 NOTE — Assessment & Plan Note (Addendum)
1.  Stage IIb carcinoma the left breast, pT3,pN0,pMX: - She is status post left mastectomy 02/10/2014, invasive ductal carcinoma with papillary features.  Positive for lymph vascular invasion.  With DCIS.  0 lymph nodes positive, ER/PR positive, Ki-67 20% HER-2 negative. - She has been on Aromasin since 2016.  Goal is for 10 years of therapy. -Last mammogram on 12/13/2018 showed B RADS category 1 negative. - She will follow-up in 6 months with repeat labs.  2.Right Renal Oncocytoma: -Patient is status post partial nephrectomy at [facility]. -Patient will continue to follow-up with [facility] as recommended.  3.  Chronic kidney disease: - Creatinine has been stable at 1.54. -Patient is being closely followed by her nephrologist.

## 2018-12-17 NOTE — Progress Notes (Signed)
Caitlin [name], [place] [address]   CLINIC:  Medical Oncology/Hematology  PCP:  Caitlin Chroman, MD [place] [address] [phone]   REASON FOR VISIT: Follow-up for left breast cancer  CURRENT THERAPY: Aromasin  BRIEF ONCOLOGIC HISTORY:  Oncology History  Malignant neoplasm of central portion of left female breast ([facility])  02/10/2014 Surgery   Left modified radical mastectomy with sentinel LN X 1   02/10/2014 Pathology Results   6.5 cm IDC, ER+ 100%, PR + 96%, Ki 67 20% HER 2 -, grade II, LVI +, negative margins 1/1 negative sentinel nodes   10/11/2014 -  Anti-estrogen oral therapy   Exemestane 25 mg daily      INTERVAL HISTORY:  Caitlin [name] 68 y.o. female returns for routine follow-up for left breast cancer.  Patient reports she has been feeling fine since her last visit.  She is taking her medication as prescribed, with no reported side effects.  She has no other complaints at this time. Denies any nausea, vomiting, or diarrhea. Denies any new pains. Had not noticed any recent bleeding such as epistaxis, hematuria or hematochezia. Denies recent chest pain on exertion, shortness of breath on minimal exertion, pre-syncopal episodes, or palpitations. Denies any numbness or tingling in hands or feet. Denies any recent fevers, infections, or recent hospitalizations. Patient reports appetite at 75% and energy level at 25%.  She is eating well maintaining her weight is time.     REVIEW OF SYSTEMS:  Review of Systems  Constitutional: Positive for fatigue.  Gastrointestinal: Negative for diarrhea.  All other systems reviewed and are negative.    PAST MEDICAL/SURGICAL HISTORY:  Past Medical History:  Diagnosis Date  . Anemia many yrs ago   not on any meds  . Anxiety    takes Xanax daily as needed  . Breast cancer ([facility])   . Cataracts, bilateral    immature  . Depression    takes Cymbalta daily  . Diverticulitis   . GERD  (gastroesophageal reflux disease)    takes Pantoprazole daily as needed  . Headache(784.0)    takes Topamax nightly;last migraine has been a while  . History of blood transfusion    no abnormal reaction noted  . History of bronchitis around 2005  . History of kidney stones   . History of shingles   . Hyperlipidemia    takes Atorvastatin daily  . Hypertension    takes Losaratn daily  . Insomnia    takes Zyprexa nightly  . Pneumonia around 2005   Past Surgical History:  Procedure Laterality Date  . BREAST RECONSTRUCTION WITH PLACEMENT OF TISSUE EXPANDER AND FLEX HD (ACELLULAR HYDRATED DERMIS) Left 02/10/2014   Procedure: LEFT BREAST RECONSTRUCTION WITH PLACEMENT OF TISSUE EXPANDER AND  FLEX HD (ACELLULAR HYDRATED DERMIS);  Surgeon: Crissie Reese, MD;  Location: [facility];  Service: Plastics;  Laterality: Left;  . COLON SURGERY     d/t diverticulitis  . colostomy take down    . ESOPHAGOGASTRODUODENOSCOPY    . HERNIA REPAIR  2014  . INCISIONAL HERNIA REPAIR    . MASTECTOMY Left   . R kidney tumor removed    . SIMPLE MASTECTOMY WITH AXILLARY SENTINEL NODE BIOPSY Left 02/10/2014   Procedure: LEFT TOTAL MASTECTOMY WITH AXILLARY SENTINEL NODE BIOPSY;  Surgeon: Edward Jolly, MD;  Location: [facility];  Service: General;  Laterality: Left;  . TONSILLECTOMY    . TUBAL LIGATION  SOCIAL HISTORY:  Social History   Socioeconomic History  . Marital status: Married    Spouse name: Not on file  . Number of children: Not on file  . Years of education: Not on file  . Highest education level: Not on file  Occupational History  . Not on file  Social Needs  . Financial resource strain: Not on file  . Food insecurity    Worry: Not on file    Inability: Not on file  . Transportation needs    Medical: Not on file    Non-medical: Not on file  Tobacco Use  . Smoking status: Former Research scientist (life sciences)  . Smokeless tobacco: Never Used  . Tobacco comment: quit smoking almost 3 yrs ago  Substance and  Sexual Activity  . Alcohol use: No  . Drug use: No  . Sexual activity: Not Currently    Birth control/protection: Post-menopausal  Lifestyle  . Physical activity    Days per week: Not on file    Minutes per session: Not on file  . Stress: Not on file  Relationships  . Social Herbalist on phone: Not on file    Gets together: Not on file    Attends religious service: Not on file    Active member of club or organization: Not on file    Attends meetings of clubs or organizations: Not on file    Relationship status: Not on file  . Intimate partner violence    Fear of current or ex partner: Not on file    Emotionally abused: Not on file    Physically abused: Not on file    Forced sexual activity: Not on file  Other Topics Concern  . Not on file  Social History Narrative  . Not on file    FAMILY HISTORY:  Family History  Problem Relation Age of Onset  . Depression Mother   . Alcohol abuse Father     CURRENT MEDICATIONS:  Outpatient Encounter Medications as of 12/17/2018  Medication Sig  . alendronate (FOSAMAX) 70 MG tablet TAKE ONE TABLET BY MOUTH ONCE A WEEK IN THE MORNING WITH A FULL GLASS OF WATER ON AN EMPTY STOMACH. DO NOT LAY DOWN FOR 30 MINUTES.  Marland Kitchen ALPRAZolam (XANAX) 0.5 MG tablet Take 1 tablet (0.5 mg total) by mouth 2 (two) times daily as needed. for anxiety (Patient taking differently: Take 0.5 mg by mouth daily. for anxiety)  . atorvastatin (LIPITOR) 10 MG tablet Take 10 mg by mouth every morning.   . cetirizine (ZYRTEC) 10 MG tablet Take 10 mg by mouth at bedtime.  . DULoxetine (CYMBALTA) 60 MG capsule Take 1 capsule (60 mg total) by mouth 2 (two) times daily.  Marland Kitchen exemestane (AROMASIN) 25 MG tablet Take 1 tablet (25 mg total) by mouth daily after breakfast.  . lamoTRIgine (LAMICTAL) 100 MG tablet Take 1 tablet (100 mg total) by mouth at bedtime.  Marland Kitchen losartan (COZAAR) 50 MG tablet Take 50 mg by mouth every morning. Take 1/2 tablet 25 mg  . OLANZapine  (ZYPREXA) 5 MG tablet Take 1 tablet (5 mg total) by mouth at bedtime.  . pantoprazole (PROTONIX) 40 MG tablet Take 40 mg by mouth daily as needed (for acid reflux).   . topiramate (TOPAMAX) 100 MG tablet Take 100 mg by mouth daily.   Marland Kitchen topiramate (TOPAMAX) 50 MG tablet Take 50 mg by mouth daily.  . vitamin B-12 (CYANOCOBALAMIN) 1000 MCG tablet Take 1,000 mcg by mouth daily.  No facility-administered encounter medications on file as of 12/17/2018.     ALLERGIES:  Allergies  Allergen Reactions  . Codeine     Nausea/vomiting     PHYSICAL EXAM:  ECOG Performance status: 1  Vitals:   12/17/18 1044  BP: (!) 142/72  Pulse: 83  Resp: 18  Temp: (!) 97.3 F (36.3 C)  SpO2: 100%   Filed Weights   12/17/18 1044  Weight: 142 lb (64.4 kg)    Physical Exam Constitutional:      Appearance: Normal appearance. She is normal weight.  Cardiovascular:     Rate and Rhythm: Normal rate and regular rhythm.     Heart sounds: Normal heart sounds.  Pulmonary:     Effort: Pulmonary effort is normal.     Breath sounds: Normal breath sounds.  Abdominal:     General: Bowel sounds are normal.     Palpations: Abdomen is soft.  Musculoskeletal: Normal range of motion.  Skin:    General: Skin is warm and dry.  Neurological:     Mental Status: She is alert and oriented to person, place, and time. Mental status is at baseline.  Psychiatric:        Mood and Affect: Mood normal.        Behavior: Behavior normal.        Thought Content: Thought content normal.        Judgment: Judgment normal.      LABORATORY DATA:  I have reviewed the labs as listed.  CBC    Component Value Date/Time   WBC 6.4 04/01/2016 1636   RBC 3.97 04/01/2016 1636   HGB 10.8 (L) 04/01/2016 1636   HCT 33.9 (L) 04/01/2016 1636   PLT 245 04/01/2016 1636   MCV 85.4 04/01/2016 1636   MCH 27.2 04/01/2016 1636   MCHC 31.9 04/01/2016 1636   RDW 15.0 04/01/2016 1636   LYMPHSABS 1.5 04/01/2016 1636   MONOABS 0.6  04/01/2016 1636   EOSABS 0.2 04/01/2016 1636   BASOSABS 0.0 04/01/2016 1636   CMP Latest Ref Rng & Units 04/01/2016 02/01/2014  Glucose 65 - 99 mg/dL 91 92  BUN 6 - 20 mg/dL 24(H) 19  Creatinine 0.44 - 1.00 mg/dL 1.60(H) 1.36(H)  Sodium 135 - 145 mmol/L 137 140  Potassium 3.5 - 5.1 mmol/L 4.0 4.3  Chloride 101 - 111 mmol/L 109 106  CO2 22 - 32 mmol/L 22 20  Calcium 8.9 - 10.3 mg/dL 9.8 9.6  Total Protein 6.5 - 8.1 g/dL 8.6(H) -  Total Bilirubin 0.3 - 1.2 mg/dL 0.4 -  Alkaline Phos 38 - 126 U/L 66 -  AST 15 - 41 U/L 17 -  ALT 14 - 54 U/L 16 -       DIAGNOSTIC IMAGING:  I have independently reviewed the mammogram and discussed with the patient.    I personally performed a face-to-face visit.  All questions were answered to patient's stated satisfaction. Encouraged patient to call with any new concerns or questions before his next visit to the cancer center and we can certain see him sooner, if needed.     ASSESSMENT & PLAN:   Malignant neoplasm of central portion of left female breast ([facility]) 1.  Stage IIb carcinoma the left breast, pT3,pN0,pMX: - She is status post left mastectomy 02/10/2014, invasive ductal carcinoma with papillary features.  Positive for lymph vascular invasion.  With DCIS.  0 lymph nodes positive, ER/PR positive, Ki-67 20% HER-2 negative. - She has been on Aromasin since 2016.  Goal is for 10 years of therapy. -Last mammogram on 12/13/2018 showed B RADS category 1 negative. - She will follow-up in 6 months with repeat labs.  2.Right Renal Oncocytoma: -Patient is status post partial nephrectomy at [facility]. -Patient will continue to follow-up with [facility] as recommended.  3.  Chronic kidney disease: - Creatinine has been stable at 1.54. -Patient is being closely followed by her nephrologist.      Orders placed this encounter:  Orders Placed This Encounter  Procedures  . Lactate dehydrogenase  . CBC with Differential/Platelet  . Comprehensive  metabolic panel  . Vitamin B12  . VITAMIN D 25 Hydroxy (Vit-D Deficiency, Fractures)      Francene Finders, FNP-C [place] [phone]

## 2018-12-17 NOTE — Addendum Note (Signed)
Addended by: Glennie Isle on: 12/17/2018 12:17 PM   Modules accepted: Orders

## 2018-12-17 NOTE — Patient Instructions (Signed)
[facility] Discharge Instructions  Follow up in 6 months with labs    Thank you for choosing [facility] at [facility] to provide your oncology and hematology care.  To afford each patient quality time with our provider, please arrive at least 15 minutes before your scheduled appointment time.   If you have a lab appointment with the Cancer Center please come in thru the  Main Entrance and check in at the main information desk  You need to re-schedule your appointment should you arrive 10 or more minutes late.  We strive to give you quality time with our providers, and arriving late affects you and other patients whose appointments are after yours.  Also, if you no show three or more times for appointments you may be dismissed from the clinic at the providers discretion.     Again, thank you for choosing [facility].  Our hope is that these requests will decrease the amount of time that you wait before being seen by our physicians.       _____________________________________________________________  Should you have questions after your visit to [facility], please contact our office at [phone] between the hours of 8:00 a.m. and 4:30 p.m.  Voicemails left after 4:00 p.m. will not be returned until the following business day.  For prescription refill requests, have your pharmacy contact our office and allow 72 hours.    Cancer Center Support Programs:   > Cancer Support Group  2nd Tuesday of the month 1pm-2pm, Journey Room    

## 2018-12-28 DIAGNOSIS — Z299 Encounter for prophylactic measures, unspecified: Secondary | ICD-10-CM | POA: Diagnosis not present

## 2018-12-28 DIAGNOSIS — Z6827 Body mass index (BMI) 27.0-27.9, adult: Secondary | ICD-10-CM | POA: Diagnosis not present

## 2018-12-28 DIAGNOSIS — I10 Essential (primary) hypertension: Secondary | ICD-10-CM | POA: Diagnosis not present

## 2018-12-28 DIAGNOSIS — M3500 Sicca syndrome, unspecified: Secondary | ICD-10-CM | POA: Diagnosis not present

## 2018-12-28 DIAGNOSIS — C50919 Malignant neoplasm of unspecified site of unspecified female breast: Secondary | ICD-10-CM | POA: Diagnosis not present

## 2018-12-30 DIAGNOSIS — E7800 Pure hypercholesterolemia, unspecified: Secondary | ICD-10-CM | POA: Diagnosis not present

## 2018-12-30 DIAGNOSIS — F329 Major depressive disorder, single episode, unspecified: Secondary | ICD-10-CM | POA: Diagnosis not present

## 2018-12-30 DIAGNOSIS — I10 Essential (primary) hypertension: Secondary | ICD-10-CM | POA: Diagnosis not present

## 2019-01-14 ENCOUNTER — Other Ambulatory Visit (HOSPITAL_COMMUNITY): Payer: Self-pay | Admitting: Psychiatry

## 2019-01-18 ENCOUNTER — Telehealth (HOSPITAL_COMMUNITY): Payer: Self-pay

## 2019-01-18 NOTE — Telephone Encounter (Signed)
Patient's pharmacy called and requested a 60 day supply of her Duloxetine 60mg . The pharmacy stated that they used up the refills to give her enough medication the last time they filled it so now patient is out. She uses [facility] on [address] in [place] and has an appointment scheduled for 03/17/19. Please review and advise. Thank you.

## 2019-01-18 NOTE — Telephone Encounter (Signed)
This was sent in on 6/23 with 2 refills

## 2019-01-28 DIAGNOSIS — E7800 Pure hypercholesterolemia, unspecified: Secondary | ICD-10-CM | POA: Diagnosis not present

## 2019-01-28 DIAGNOSIS — F329 Major depressive disorder, single episode, unspecified: Secondary | ICD-10-CM | POA: Diagnosis not present

## 2019-01-28 DIAGNOSIS — I10 Essential (primary) hypertension: Secondary | ICD-10-CM | POA: Diagnosis not present

## 2019-02-15 ENCOUNTER — Other Ambulatory Visit (HOSPITAL_COMMUNITY): Payer: Self-pay | Admitting: Psychiatry

## 2019-03-01 DIAGNOSIS — E7800 Pure hypercholesterolemia, unspecified: Secondary | ICD-10-CM | POA: Diagnosis not present

## 2019-03-01 DIAGNOSIS — I10 Essential (primary) hypertension: Secondary | ICD-10-CM | POA: Diagnosis not present

## 2019-03-01 DIAGNOSIS — F329 Major depressive disorder, single episode, unspecified: Secondary | ICD-10-CM | POA: Diagnosis not present

## 2019-03-14 ENCOUNTER — Other Ambulatory Visit (HOSPITAL_COMMUNITY): Payer: Self-pay | Admitting: Psychiatry

## 2019-03-17 ENCOUNTER — Other Ambulatory Visit: Payer: Self-pay

## 2019-03-17 ENCOUNTER — Encounter (HOSPITAL_COMMUNITY): Payer: Self-pay | Admitting: Psychiatry

## 2019-03-17 ENCOUNTER — Ambulatory Visit (INDEPENDENT_AMBULATORY_CARE_PROVIDER_SITE_OTHER): Payer: Medicare HMO | Admitting: Psychiatry

## 2019-03-17 DIAGNOSIS — F331 Major depressive disorder, recurrent, moderate: Secondary | ICD-10-CM | POA: Diagnosis not present

## 2019-03-17 MED ORDER — OLANZAPINE 5 MG PO TABS: 5.0000 mg | ORAL_TABLET | Freq: Every day | ORAL | 2 refills | Status: DC

## 2019-03-17 MED ORDER — LAMOTRIGINE 100 MG PO TABS: 100.0000 mg | ORAL_TABLET | Freq: Every day | ORAL | 2 refills | Status: DC

## 2019-03-17 MED ORDER — ALPRAZOLAM 0.5 MG PO TABS: 0.5000 mg | ORAL_TABLET | Freq: Two times a day (BID) | ORAL | 2 refills | Status: DC | PRN

## 2019-03-17 MED ORDER — DULOXETINE HCL 60 MG PO CPEP: 60.0000 mg | ORAL_CAPSULE | Freq: Two times a day (BID) | ORAL | 2 refills | Status: DC

## 2019-03-17 NOTE — Progress Notes (Signed)
Virtual Visit via Telephone Note  I connected with Caitlin [name] on 03/17/19 at  1:20 PM EDT by telephone and verified that I am speaking with the correct person using two identifiers.   I discussed the limitations, risks, security and privacy concerns of performing an evaluation and management service by telephone and the availability of in person appointments. I also discussed with the patient that there may be a patient responsible charge related to this service. The patient expressed understanding and agreed to proceed.    I discussed the assessment and treatment plan with the patient. The patient was provided an opportunity to ask questions and all were answered. The patient agreed with the plan and demonstrated an understanding of the instructions.   The patient was advised to call back or seek an in-person evaluation if the symptoms worsen or if the condition fails to improve as anticipated.  I provided 15 minutes of non-face-to-face time during this encounter.   [name], MD  [facility] MD/PA/NP OP Progress Note  03/17/2019 1:33 PM Caitlin [name]  MRN:  [id number]  Chief Complaint:  Chief Complaint    Depression; Anxiety; Follow-up     HPI: patient is a 68 year old married white female who lives with her husband in [place]. She has 4 children and 6 grandchildren. She worked in a [employer] until they let her go in 2005. She is self-referred.  The patient states that her depression started in the late 90s. She had to have an emergency colostomy due to diverticulosis. She had complications from the surgery and had to have a hernia repair and several other surgeries on her abdomen. She is left with a huge scar which lowered her self-confidence and self-esteem. She also suffers from migraine headaches.  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 Since then she's really gone downhill. She's become increasingly depressed and anxious. She's been seen at [facility]  since 2009 but feels like the most recent psychiatrist does not listen to her and changed all the medications that were helpful. Currently she has lots of headaches, she's tired all the time and doesn't leave the house. She feels like she is a burden to everyone around her and has passive suicidal ideation but no plan. She sleeps okay her appetite has diminished.  In 2011 she was admitted to [facility] because she was depressed and psychotic. She thinks this was a reaction to a medication that she got from migraine headache. She doesn't recall the name of the medicine. She was not hospitalized since then has had little therapy  The patient returns for follow-up after 3 months.  She states that she is doing about the same.  She was very worried because her brother was hospitalized and she has had "nothing to do with him" for several years.  They really were not on speaking terms.  They had a big falling out years ago after their mother died.  However the hospital he was then kept calling her to get information about his medications etc.  She kept telling them to call the [facility] because this is where he had been going and finally after about 2 weeks a stop calling.  She claims that it "tore my nerves."  However now things are getting a little bit better.  For the most part her mood is stable and she denies serious depression suicidal ideation or anxiety.  She still wakes up in the middle of the night and  cannot sleep at night suggested she take her second Xanax than.  She still does not do much during the day other than watch TV but this is how she seems to like it and does not really want to make any changes.  I urged her to get outside when the weather is nice. Visit Diagnosis:    ICD-10-CM   1. Major depressive disorder, recurrent episode, moderate ([facility])  F33.1     Past Psychiatric History: One psychiatric hospitalization in 2011  Past Medical History:  Past Medical History:  Diagnosis Date   . Anemia many yrs ago   not on any meds  . Anxiety    takes Xanax daily as needed  . Breast cancer ([facility])   . Cataracts, bilateral    immature  . Depression    takes Cymbalta daily  . Diverticulitis   . GERD (gastroesophageal reflux disease)    takes Pantoprazole daily as needed  . Headache(784.0)    takes Topamax nightly;last migraine has been a while  . History of blood transfusion    no abnormal reaction noted  . History of bronchitis around 2005  . History of kidney stones   . History of shingles   . Hyperlipidemia    takes Atorvastatin daily  . Hypertension    takes Losaratn daily  . Insomnia    takes Zyprexa nightly  . Pneumonia around 2005    Past Surgical History:  Procedure Laterality Date  . BREAST RECONSTRUCTION WITH PLACEMENT OF TISSUE EXPANDER AND FLEX HD (ACELLULAR HYDRATED DERMIS) Left 02/10/2014   Procedure: LEFT BREAST RECONSTRUCTION WITH PLACEMENT OF TISSUE EXPANDER AND  FLEX HD (ACELLULAR HYDRATED DERMIS);  Surgeon: Crissie Reese, MD;  Location: [facility];  Service: Plastics;  Laterality: Left;  . COLON SURGERY     d/t diverticulitis  . colostomy take down    . ESOPHAGOGASTRODUODENOSCOPY    . HERNIA REPAIR  2014  . INCISIONAL HERNIA REPAIR    . MASTECTOMY Left   . R kidney tumor removed    . SIMPLE MASTECTOMY WITH AXILLARY SENTINEL NODE BIOPSY Left 02/10/2014   Procedure: LEFT TOTAL MASTECTOMY WITH AXILLARY SENTINEL NODE BIOPSY;  Surgeon: Edward Jolly, MD;  Location: [facility];  Service: General;  Laterality: Left;  . TONSILLECTOMY    . TUBAL LIGATION      Family Psychiatric History: see below  Family History:  Family History  Problem Relation Age of Onset  . Depression Mother   . Alcohol abuse Father     Social History:  Social History   Socioeconomic History  . Marital status: Married    Spouse name: Not on file  . Number of children: Not on file  . Years of education: Not on file  . Highest education level: Not on file  Occupational  History  . Not on file  Social Needs  . Financial resource strain: Not on file  . Food insecurity    Worry: Not on file    Inability: Not on file  . Transportation needs    Medical: Not on file    Non-medical: Not on file  Tobacco Use  . Smoking status: Former Research scientist (life sciences)  . Smokeless tobacco: Never Used  . Tobacco comment: quit smoking almost 3 yrs ago  Substance and Sexual Activity  . Alcohol use: No  . Drug use: No  . Sexual activity: Not Currently    Birth control/protection: Post-menopausal  Lifestyle  . Physical activity    Days per week: Not on file  Minutes per session: Not on file  . Stress: Not on file  Relationships  . Social Herbalist on phone: Not on file    Gets together: Not on file    Attends religious service: Not on file    Active member of club or organization: Not on file    Attends meetings of clubs or organizations: Not on file    Relationship status: Not on file  Other Topics Concern  . Not on file  Social History Narrative  . Not on file    Allergies:  Allergies  Allergen Reactions  . Codeine     Nausea/vomiting    Metabolic Disorder Labs: No results found for: HGBA1C, MPG No results found for: PROLACTIN No results found for: CHOL, TRIG, HDL, CHOLHDL, VLDL, LDLCALC No results found for: TSH  Therapeutic Level Labs: No results found for: LITHIUM No results found for: VALPROATE No components found for:  CBMZ  Current Medications: Current Outpatient Medications  Medication Sig Dispense Refill  . alendronate (FOSAMAX) 70 MG tablet TAKE ONE TABLET BY MOUTH ONCE A WEEK IN THE MORNING WITH A FULL GLASS OF WATER ON AN EMPTY STOMACH. DO NOT LAY DOWN FOR 30 MINUTES.    Marland Kitchen ALPRAZolam (XANAX) 0.5 MG tablet Take 1 tablet (0.5 mg total) by mouth 2 (two) times daily as needed. for anxiety 60 tablet 2  . atorvastatin (LIPITOR) 10 MG tablet Take 10 mg by mouth every morning.     . cetirizine (ZYRTEC) 10 MG tablet Take 10 mg by mouth at  bedtime.  12  . DULoxetine (CYMBALTA) 60 MG capsule Take 1 capsule (60 mg total) by mouth 2 (two) times daily. 30 capsule 2  . exemestane (AROMASIN) 25 MG tablet Take 1 tablet (25 mg total) by mouth daily after breakfast. 90 tablet 4  . lamoTRIgine (LAMICTAL) 100 MG tablet Take 1 tablet (100 mg total) by mouth at bedtime. 30 tablet 2  . losartan (COZAAR) 50 MG tablet Take 50 mg by mouth every morning. Take 1/2 tablet 25 mg    . OLANZapine (ZYPREXA) 5 MG tablet Take 1 tablet (5 mg total) by mouth at bedtime. 30 tablet 2  . pantoprazole (PROTONIX) 40 MG tablet Take 40 mg by mouth daily as needed (for acid reflux).     . topiramate (TOPAMAX) 100 MG tablet Take 100 mg by mouth daily.     Marland Kitchen topiramate (TOPAMAX) 50 MG tablet Take 50 mg by mouth daily.    . vitamin B-12 (CYANOCOBALAMIN) 1000 MCG tablet Take 1,000 mcg by mouth daily.      No current facility-administered medications for this visit.      Musculoskeletal: Strength & Muscle Tone: within normal limits Gait & Station: normal Patient leans: N/A  Psychiatric Specialty Exam: Review of Systems  Constitutional: Positive for malaise/fatigue.  Psychiatric/Behavioral: The patient is nervous/anxious.   All other systems reviewed and are negative.   There were no vitals taken for this visit.There is no height or weight on file to calculate BMI.  General Appearance: NA  Eye Contact:  NA  Speech:  Clear and Coherent  Volume:  Normal  Mood:  Anxious  Affect:  NA  Thought Process:  Goal Directed  Orientation:  Full (Time, Place, and Person)  Thought Content: Rumination   Suicidal Thoughts:  No  Homicidal Thoughts:  No  Memory:  Immediate;   Good Recent;   Good Remote;   Fair  Judgement:  Fair  Insight:  Fair  Psychomotor Activity:  Decreased  Concentration:  Concentration: Fair and Attention Span: Fair  Recall:  Good  Fund of Knowledge: Fair  Language: Good  Akathisia:  No  Handed:  Right  AIMS (if indicated): not done   Assets:  Communication Skills Desire for Improvement Resilience Social Support Talents/Skills  ADL's:  Intact  Cognition: WNL  Sleep:  Fair   Screenings:   Assessment and Plan: This patient is a 68 year old female with a history of depression and anxiety.  She is doing fairly well but needs to be more active.  For now she will continue Cymbalta 60 mg twice daily for depression, Lamictal 100 mg at bedtime for mood stabilization as well as olanzapine 5 mg at bedtime for mood stabilization and Xanax 0.5 mg up to twice a day as needed for anxiety.  She can use a second dose to help her go back to sleep if she wakes up through the night.  She will return to see me in 3 months   [name], MD 03/17/2019, 1:33 PM

## 2019-03-18 ENCOUNTER — Other Ambulatory Visit (HOSPITAL_COMMUNITY): Payer: Self-pay | Admitting: Psychiatry

## 2019-03-30 DIAGNOSIS — Z6827 Body mass index (BMI) 27.0-27.9, adult: Secondary | ICD-10-CM | POA: Diagnosis not present

## 2019-03-30 DIAGNOSIS — C50919 Malignant neoplasm of unspecified site of unspecified female breast: Secondary | ICD-10-CM | POA: Diagnosis not present

## 2019-03-30 DIAGNOSIS — E7800 Pure hypercholesterolemia, unspecified: Secondary | ICD-10-CM | POA: Diagnosis not present

## 2019-03-30 DIAGNOSIS — Z299 Encounter for prophylactic measures, unspecified: Secondary | ICD-10-CM | POA: Diagnosis not present

## 2019-03-30 DIAGNOSIS — I10 Essential (primary) hypertension: Secondary | ICD-10-CM | POA: Diagnosis not present

## 2019-04-12 DIAGNOSIS — F329 Major depressive disorder, single episode, unspecified: Secondary | ICD-10-CM | POA: Diagnosis not present

## 2019-04-12 DIAGNOSIS — I10 Essential (primary) hypertension: Secondary | ICD-10-CM | POA: Diagnosis not present

## 2019-04-12 DIAGNOSIS — E7800 Pure hypercholesterolemia, unspecified: Secondary | ICD-10-CM | POA: Diagnosis not present

## 2019-04-18 ENCOUNTER — Other Ambulatory Visit (HOSPITAL_COMMUNITY): Payer: Self-pay | Admitting: Psychiatry

## 2019-05-09 DIAGNOSIS — F329 Major depressive disorder, single episode, unspecified: Secondary | ICD-10-CM | POA: Diagnosis not present

## 2019-05-09 DIAGNOSIS — I10 Essential (primary) hypertension: Secondary | ICD-10-CM | POA: Diagnosis not present

## 2019-05-09 DIAGNOSIS — E7800 Pure hypercholesterolemia, unspecified: Secondary | ICD-10-CM | POA: Diagnosis not present

## 2019-05-17 ENCOUNTER — Telehealth (HOSPITAL_COMMUNITY): Payer: Self-pay

## 2019-05-17 ENCOUNTER — Other Ambulatory Visit (HOSPITAL_COMMUNITY): Payer: Self-pay | Admitting: Psychiatry

## 2019-05-17 NOTE — Telephone Encounter (Signed)
Pharmacy called and stated they need a new script for patient's Duloxetine 60mg . Patient's sig is 1 cap po BID and it was sent in for #30. The pharmacy wasn't sure if it was supposed to be for #30 or #60. She has a scheduled appointment for 06/20/19. Please review and advise. Thank you.

## 2019-05-17 NOTE — Telephone Encounter (Signed)
sent 

## 2019-05-18 NOTE — Telephone Encounter (Signed)
Thank you. Spoke with pharmacy. Patient p/u medication

## 2019-06-06 DIAGNOSIS — I10 Essential (primary) hypertension: Secondary | ICD-10-CM | POA: Diagnosis not present

## 2019-06-06 DIAGNOSIS — E7800 Pure hypercholesterolemia, unspecified: Secondary | ICD-10-CM | POA: Diagnosis not present

## 2019-06-06 DIAGNOSIS — F329 Major depressive disorder, single episode, unspecified: Secondary | ICD-10-CM | POA: Diagnosis not present

## 2019-06-20 ENCOUNTER — Ambulatory Visit (INDEPENDENT_AMBULATORY_CARE_PROVIDER_SITE_OTHER): Payer: Medicare HMO | Admitting: Psychiatry

## 2019-06-20 ENCOUNTER — Encounter (HOSPITAL_COMMUNITY): Payer: Self-pay | Admitting: Psychiatry

## 2019-06-20 ENCOUNTER — Other Ambulatory Visit: Payer: Self-pay

## 2019-06-20 DIAGNOSIS — F331 Major depressive disorder, recurrent, moderate: Secondary | ICD-10-CM

## 2019-06-20 MED ORDER — OLANZAPINE 5 MG PO TABS: 5.0000 mg | ORAL_TABLET | Freq: Every day | ORAL | 2 refills | Status: DC

## 2019-06-20 MED ORDER — DULOXETINE HCL 60 MG PO CPEP: 60.0000 mg | ORAL_CAPSULE | Freq: Two times a day (BID) | ORAL | 2 refills | Status: DC

## 2019-06-20 MED ORDER — LAMOTRIGINE 100 MG PO TABS: 100.0000 mg | ORAL_TABLET | Freq: Every day | ORAL | 2 refills | Status: DC

## 2019-06-20 MED ORDER — ALPRAZOLAM 0.5 MG PO TABS: 0.5000 mg | ORAL_TABLET | Freq: Two times a day (BID) | ORAL | 2 refills | Status: DC | PRN

## 2019-06-20 NOTE — Progress Notes (Signed)
Virtual Visit via Telephone Note  I connected with Caitlin [name] on 06/20/19 at  1:00 PM EST by telephone and verified that I am speaking with the correct person using two identifiers.   I discussed the limitations, risks, security and privacy concerns of performing an evaluation and management service by telephone and the availability of in person appointments. I also discussed with the patient that there may be a patient responsible charge related to this service. The patient expressed understanding and agreed to proceed.     I discussed the assessment and treatment plan with the patient. The patient was provided an opportunity to ask questions and all were answered. The patient agreed with the plan and demonstrated an understanding of the instructions.   The patient was advised to call back or seek an in-person evaluation if the symptoms worsen or if the condition fails to improve as anticipated.  I provided 15 minutes of non-face-to-face time during this encounter.   [name], MD  [facility] MD/PA/NP OP Progress Note  06/20/2019 1:14 PM Caitlin [name]  MRN:  [id number]  Chief Complaint:  Chief Complaint    Depression; Anxiety; Follow-up     HPI: This patient is a 68 year old married white female who lives with her husband in [place]. She has 4 children and 6 grandchildren. She worked in a [employer] until they let her go in 2005. She is self-referred.  The patient states that her depression started in the late 90s. She had to have an emergency colostomy due to diverticulosis. She had complications from the surgery and had to have a hernia repair and several other surgeries on her abdomen. She is left with a huge scar which lowered her self-confidence and self-esteem. She also suffers from migraine headaches.  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 Since then she's really gone downhill. She's become increasingly depressed and anxious. She's been seen at  [facility] since 2009 but feels like the most recent psychiatrist does not listen to her and changed all the medications that were helpful. Currently she has lots of headaches, she's tired all the time and doesn't leave the house. She feels like she is a burden to everyone around her and has passive suicidal ideation but no plan. She sleeps okay her appetite has diminished.  In 2011 she was admitted to [facility] because she was depressed and psychotic. She thinks this was a reaction to a medication that she got from migraine headache. She doesn't recall the name of the medicine. She was not hospitalized since then has had little therapy  The patient returns for follow-up after 3 months.  She states that she is doing about the same.  Her mood has been stable and she denies serious depression or anxiety.  She still wakes up in the middle of the night and gets up for a while and then goes back to sleep.  She also takes naps during the day which are probably interfering with her nighttime sleep.  She has gotten a out a little more doing grocery and Christmas shopping but otherwise just stays in the house.  She denies any recent changes in her health.  She denies suicidal ideation. Visit Diagnosis:    ICD-10-CM   1. Major depressive disorder, recurrent episode, moderate (HCC)  F33.1     Past Psychiatric History: 1 psychiatric hospitalization in 2011  Past Medical History:  Past Medical History:  Diagnosis Date  . Anemia many  yrs ago   not on any meds  . Anxiety    takes Xanax daily as needed  . Breast cancer ([place])   . Cataracts, bilateral    immature  . Depression    takes Cymbalta daily  . Diverticulitis   . GERD (gastroesophageal reflux disease)    takes Pantoprazole daily as needed  . Headache(784.0)    takes Topamax nightly;last migraine has been a while  . History of blood transfusion    no abnormal reaction noted  . History of bronchitis around 2005  . History of kidney  stones   . History of shingles   . Hyperlipidemia    takes Atorvastatin daily  . Hypertension    takes Losaratn daily  . Insomnia    takes Zyprexa nightly  . Pneumonia around 2005    Past Surgical History:  Procedure Laterality Date  . BREAST RECONSTRUCTION WITH PLACEMENT OF TISSUE EXPANDER AND FLEX HD (ACELLULAR HYDRATED DERMIS) Left 02/10/2014   Procedure: LEFT BREAST RECONSTRUCTION WITH PLACEMENT OF TISSUE EXPANDER AND  FLEX HD (ACELLULAR HYDRATED DERMIS);  Surgeon: Crissie Reese, MD;  Location: [facility];  Service: Plastics;  Laterality: Left;  . COLON SURGERY     d/t diverticulitis  . colostomy take down    . ESOPHAGOGASTRODUODENOSCOPY    . HERNIA REPAIR  2014  . INCISIONAL HERNIA REPAIR    . MASTECTOMY Left   . R kidney tumor removed    . SIMPLE MASTECTOMY WITH AXILLARY SENTINEL NODE BIOPSY Left 02/10/2014   Procedure: LEFT TOTAL MASTECTOMY WITH AXILLARY SENTINEL NODE BIOPSY;  Surgeon: Edward Jolly, MD;  Location: [facility];  Service: General;  Laterality: Left;  . TONSILLECTOMY    . TUBAL LIGATION      Family Psychiatric History: see below  Family History:  Family History  Problem Relation Age of Onset  . Depression Mother   . Alcohol abuse Father     Social History:  Social History   Socioeconomic History  . Marital status: Married    Spouse name: Not on file  . Number of children: Not on file  . Years of education: Not on file  . Highest education level: Not on file  Occupational History  . Not on file  Tobacco Use  . Smoking status: Former Research scientist (life sciences)  . Smokeless tobacco: Never Used  . Tobacco comment: quit smoking almost 3 yrs ago  Substance and Sexual Activity  . Alcohol use: No  . Drug use: No  . Sexual activity: Not Currently    Birth control/protection: Post-menopausal  Other Topics Concern  . Not on file  Social History Narrative  . Not on file   Social Determinants of Health   Financial Resource Strain:   . Difficulty of Paying Living  Expenses: Not on file  Food Insecurity:   . Worried About Charity fundraiser in the Last Year: Not on file  . Ran Out of Food in the Last Year: Not on file  Transportation Needs:   . Lack of Transportation (Medical): Not on file  . Lack of Transportation (Non-Medical): Not on file  Physical Activity:   . Days of Exercise per Week: Not on file  . Minutes of Exercise per Session: Not on file  Stress:   . Feeling of Stress : Not on file  Social Connections:   . Frequency of Communication with Friends and Family: Not on file  . Frequency of Social Gatherings with Friends and Family: Not on file  . Attends  Religious Services: Not on file  . Active Member of Clubs or Organizations: Not on file  . Attends Archivist Meetings: Not on file  . Marital Status: Not on file    Allergies:  Allergies  Allergen Reactions  . Codeine     Nausea/vomiting    Metabolic Disorder Labs: No results found for: HGBA1C, MPG No results found for: PROLACTIN No results found for: CHOL, TRIG, HDL, CHOLHDL, VLDL, LDLCALC No results found for: TSH  Therapeutic Level Labs: No results found for: LITHIUM No results found for: VALPROATE No components found for:  CBMZ  Current Medications: Current Outpatient Medications  Medication Sig Dispense Refill  . alendronate (FOSAMAX) 70 MG tablet TAKE ONE TABLET BY MOUTH ONCE A WEEK IN THE MORNING WITH A FULL GLASS OF WATER ON AN EMPTY STOMACH. DO NOT LAY DOWN FOR 30 MINUTES.    Marland Kitchen ALPRAZolam (XANAX) 0.5 MG tablet Take 1 tablet (0.5 mg total) by mouth 2 (two) times daily as needed. for anxiety 60 tablet 2  . atorvastatin (LIPITOR) 10 MG tablet Take 10 mg by mouth every morning.     . cetirizine (ZYRTEC) 10 MG tablet Take 10 mg by mouth at bedtime.  12  . DULoxetine (CYMBALTA) 60 MG capsule Take 1 capsule (60 mg total) by mouth 2 (two) times daily. 60 capsule 2  . exemestane (AROMASIN) 25 MG tablet Take 1 tablet (25 mg total) by mouth daily after breakfast.  90 tablet 4  . lamoTRIgine (LAMICTAL) 100 MG tablet Take 1 tablet (100 mg total) by mouth at bedtime. 30 tablet 2  . losartan (COZAAR) 50 MG tablet Take 50 mg by mouth every morning. Take 1/2 tablet 25 mg    . OLANZapine (ZYPREXA) 5 MG tablet Take 1 tablet (5 mg total) by mouth at bedtime. 30 tablet 2  . pantoprazole (PROTONIX) 40 MG tablet Take 40 mg by mouth daily as needed (for acid reflux).     . topiramate (TOPAMAX) 100 MG tablet Take 100 mg by mouth daily.     Marland Kitchen topiramate (TOPAMAX) 50 MG tablet Take 50 mg by mouth daily.    . vitamin B-12 (CYANOCOBALAMIN) 1000 MCG tablet Take 1,000 mcg by mouth daily.      No current facility-administered medications for this visit.     Musculoskeletal: Strength & Muscle Tone: within normal limits Gait & Station: normal Patient leans: N/A  Psychiatric Specialty Exam: Review of Systems  All other systems reviewed and are negative.   There were no vitals taken for this visit.There is no height or weight on file to calculate BMI.  General Appearance: NA  Eye Contact:  NA  Speech:  Clear and Coherent  Volume:  Normal  Mood:  Euthymic  Affect:  NA  Thought Process:  Goal Directed  Orientation:  Full (Time, Place, and Person)  Thought Content: WDL   Suicidal Thoughts:  No  Homicidal Thoughts:  No  Memory:  Immediate;   Good Recent;   Fair Remote;   Fair  Judgement:  Good  Insight:  Fair  Psychomotor Activity:  Decreased  Concentration:  Concentration: Fair and Attention Span: Fair  Recall:  [employer] of Knowledge: Fair  Language: Good  Akathisia:  No  Handed:  Right  AIMS (if indicated): not done  Assets:  Communication Skills Desire for Improvement Resilience Social Support Talents/Skills  ADL's:  Intact  Cognition: WNL  Sleep: fair   Screenings:   Assessment and Plan: This patient is a 68 year old  female with a history of depression and anxiety.  For the most part she is doing fairly well.  She will continue Cymbalta 60  mg twice daily for depression, Lamictal 100 mg at bedtime for mood stabilization, olanzapine 5 mg at bedtime also for mood stabilization and Xanax 0.5 mg twice daily as needed for anxiety.  She will return to see me in 3 months   [name], MD 06/20/2019, 1:14 PM

## 2019-06-30 DIAGNOSIS — E7800 Pure hypercholesterolemia, unspecified: Secondary | ICD-10-CM | POA: Diagnosis not present

## 2019-06-30 DIAGNOSIS — I10 Essential (primary) hypertension: Secondary | ICD-10-CM | POA: Diagnosis not present

## 2019-06-30 DIAGNOSIS — F329 Major depressive disorder, single episode, unspecified: Secondary | ICD-10-CM | POA: Diagnosis not present

## 2019-07-01 ENCOUNTER — Inpatient Hospital Stay (HOSPITAL_COMMUNITY): Payer: Medicare HMO

## 2019-07-01 ENCOUNTER — Ambulatory Visit (HOSPITAL_COMMUNITY): Payer: Medicare HMO | Admitting: Hematology

## 2019-07-07 ENCOUNTER — Inpatient Hospital Stay (HOSPITAL_COMMUNITY): Payer: Medicare HMO | Attending: Hematology

## 2019-07-07 ENCOUNTER — Other Ambulatory Visit: Payer: Self-pay

## 2019-07-07 ENCOUNTER — Inpatient Hospital Stay (HOSPITAL_COMMUNITY): Payer: Medicare HMO | Attending: Nurse Practitioner | Admitting: Hematology

## 2019-07-07 VITALS — BP 139/71 | HR 73 | Temp 97.4°F | Resp 18 | Wt 143.0 lb

## 2019-07-07 DIAGNOSIS — N189 Chronic kidney disease, unspecified: Secondary | ICD-10-CM | POA: Insufficient documentation

## 2019-07-07 DIAGNOSIS — Z170 Estrogen receptor positive status [ER+]: Secondary | ICD-10-CM | POA: Diagnosis not present

## 2019-07-07 DIAGNOSIS — Z79811 Long term (current) use of aromatase inhibitors: Secondary | ICD-10-CM | POA: Insufficient documentation

## 2019-07-07 DIAGNOSIS — D3001 Benign neoplasm of right kidney: Secondary | ICD-10-CM | POA: Diagnosis not present

## 2019-07-07 DIAGNOSIS — C50112 Malignant neoplasm of central portion of left female breast: Secondary | ICD-10-CM | POA: Insufficient documentation

## 2019-07-07 LAB — COMPREHENSIVE METABOLIC PANEL
ALT: 15 U/L (ref 0–44)
AST: 19 U/L (ref 15–41)
Albumin: 3.6 g/dL (ref 3.5–5.0)
Alkaline Phosphatase: 51 U/L (ref 38–126)
Anion gap: 7 (ref 5–15)
BUN: 16 mg/dL (ref 8–23)
CO2: 21 mmol/L — ABNORMAL LOW (ref 22–32)
Calcium: 9.9 mg/dL (ref 8.9–10.3)
Chloride: 112 mmol/L — ABNORMAL HIGH (ref 98–111)
Creatinine, Ser: 1.27 mg/dL — ABNORMAL HIGH (ref 0.44–1.00)
GFR calc Af Amer: 50 mL/min — ABNORMAL LOW (ref >60)
GFR calc non Af Amer: 43 mL/min — ABNORMAL LOW (ref >60)
Glucose, Bld: 86 mg/dL (ref 70–99)
Potassium: 4.2 mmol/L (ref 3.5–5.1)
Sodium: 140 mmol/L (ref 135–145)
Total Bilirubin: 0.7 mg/dL (ref 0.3–1.2)
Total Protein: 8 g/dL (ref 6.5–8.1)

## 2019-07-07 LAB — CBC WITH DIFFERENTIAL/PLATELET
Abs Immature Granulocytes: 0.01 10*3/uL (ref 0.00–0.07)
Basophils Absolute: 0 10*3/uL (ref 0.0–0.1)
Basophils Relative: 1 %
Eosinophils Absolute: 0.1 10*3/uL (ref 0.0–0.5)
Eosinophils Relative: 1 %
HCT: 37.4 % (ref 36.0–46.0)
Hemoglobin: 11.9 g/dL — ABNORMAL LOW (ref 12.0–15.0)
Immature Granulocytes: 0 %
Lymphocytes Relative: 29 %
Lymphs Abs: 1.4 10*3/uL (ref 0.7–4.0)
MCH: 28.7 pg (ref 26.0–34.0)
MCHC: 31.8 g/dL (ref 30.0–36.0)
MCV: 90.1 fL (ref 80.0–100.0)
Monocytes Absolute: 0.5 10*3/uL (ref 0.1–1.0)
Monocytes Relative: 10 %
Neutro Abs: 2.9 10*3/uL (ref 1.7–7.7)
Neutrophils Relative %: 59 %
Platelets: 219 10*3/uL (ref 150–400)
RBC: 4.15 MIL/uL (ref 3.87–5.11)
RDW: 13.7 % (ref 11.5–15.5)
WBC: 4.9 10*3/uL (ref 4.0–10.5)
nRBC: 0 % (ref 0.0–0.2)

## 2019-07-07 LAB — LACTATE DEHYDROGENASE: LDH: 125 U/L (ref 98–192)

## 2019-07-07 LAB — VITAMIN D 25 HYDROXY (VIT D DEFICIENCY, FRACTURES): Vit D, 25-Hydroxy: 49.23 ng/mL (ref 30–100)

## 2019-07-07 LAB — VITAMIN B12: Vitamin B-12: 3178 pg/mL — ABNORMAL HIGH (ref 180–914)

## 2019-07-07 NOTE — Progress Notes (Signed)
[place], [place] [address]   CLINIC:  Medical Oncology/Hematology  PCP:  Glenda Chroman, MD [place] [address] [phone]   REASON FOR VISIT:  Follow-up for Breast Cancer   CURRENT THERAPY: Aromasin   BRIEF ONCOLOGIC HISTORY:  Oncology History  Malignant neoplasm of central portion of left female breast ([place])  02/10/2014 Surgery   Left modified radical mastectomy with sentinel LN X 1   02/10/2014 Pathology Results   6.5 cm IDC, ER+ 100%, PR + 96%, Ki 67 20% HER 2 -, grade II, LVI +, negative margins 1/1 negative sentinel nodes   10/11/2014 -  Anti-estrogen oral therapy   Exemestane 25 mg daily       INTERVAL HISTORY:  Caitlin [name] 69 y.o. female presents today for follow-up.  Reports overall doing well.  Denies any changes in her breast.  No new mass, lumps, nipple inversion or discharge.  She reports overall doing well.  She continues on antiestrogen therapy, tolerating well.  No change in bowel habits.  Appetite is stable.  No weight loss.  She is here for repeat labs and office visit.    REVIEW OF SYSTEMS:  Review of Systems  Constitutional: Negative.   HENT:  Negative.   Eyes: Negative.   Respiratory: Negative.   Cardiovascular: Negative.   Gastrointestinal:       Colostomy  Genitourinary: Negative.    Musculoskeletal: Positive for arthralgias and myalgias.  Skin: Negative.   Neurological: Negative.   Hematological: Negative.   Psychiatric/Behavioral: Negative.      PAST MEDICAL/SURGICAL HISTORY:  Past Medical History:  Diagnosis Date  . Anemia many yrs ago   not on any meds  . Anxiety    takes Xanax daily as needed  . Breast cancer ([place])   . Cataracts, bilateral    immature  . Depression    takes Cymbalta daily  . Diverticulitis   . GERD (gastroesophageal reflux disease)    takes Pantoprazole daily as needed  . Headache(784.0)    takes Topamax nightly;last migraine has been a while  . History  of blood transfusion    no abnormal reaction noted  . History of bronchitis around 2005  . History of kidney stones   . History of shingles   . Hyperlipidemia    takes Atorvastatin daily  . Hypertension    takes Losaratn daily  . Insomnia    takes Zyprexa nightly  . Pneumonia around 2005   Past Surgical History:  Procedure Laterality Date  . BREAST RECONSTRUCTION WITH PLACEMENT OF TISSUE EXPANDER AND FLEX HD (ACELLULAR HYDRATED DERMIS) Left 02/10/2014   Procedure: LEFT BREAST RECONSTRUCTION WITH PLACEMENT OF TISSUE EXPANDER AND  FLEX HD (ACELLULAR HYDRATED DERMIS);  Surgeon: Crissie Reese, MD;  Location: [facility];  Service: Plastics;  Laterality: Left;  . COLON SURGERY     d/t diverticulitis  . colostomy take down    . ESOPHAGOGASTRODUODENOSCOPY    . HERNIA REPAIR  2014  . INCISIONAL HERNIA REPAIR    . MASTECTOMY Left   . R kidney tumor removed    . SIMPLE MASTECTOMY WITH AXILLARY SENTINEL NODE BIOPSY Left 02/10/2014   Procedure: LEFT TOTAL MASTECTOMY WITH AXILLARY SENTINEL NODE BIOPSY;  Surgeon: Edward Jolly, MD;  Location: [facility];  Service: General;  Laterality: Left;  . TONSILLECTOMY    . TUBAL LIGATION       SOCIAL HISTORY:  Social History   Socioeconomic History  .  Marital status: Married    Spouse name: Not on file  . Number of children: Not on file  . Years of education: Not on file  . Highest education level: Not on file  Occupational History  . Not on file  Tobacco Use  . Smoking status: Former Research scientist (life sciences)  . Smokeless tobacco: Never Used  . Tobacco comment: quit smoking almost 3 yrs ago  Substance and Sexual Activity  . Alcohol use: No  . Drug use: No  . Sexual activity: Not Currently    Birth control/protection: Post-menopausal  Other Topics Concern  . Not on file  Social History Narrative  . Not on file   Social Determinants of Health   Financial Resource Strain:   . Difficulty of Paying Living Expenses: Not on file  Food Insecurity:   . Worried  About Charity fundraiser in the Last Year: Not on file  . Ran Out of Food in the Last Year: Not on file  Transportation Needs:   . Lack of Transportation (Medical): Not on file  . Lack of Transportation (Non-Medical): Not on file  Physical Activity:   . Days of Exercise per Week: Not on file  . Minutes of Exercise per Session: Not on file  Stress:   . Feeling of Stress : Not on file  Social Connections:   . Frequency of Communication with Friends and Family: Not on file  . Frequency of Social Gatherings with Friends and Family: Not on file  . Attends Religious Services: Not on file  . Active Member of Clubs or Organizations: Not on file  . Attends Archivist Meetings: Not on file  . Marital Status: Not on file  Intimate Partner Violence:   . Fear of Current or Ex-Partner: Not on file  . Emotionally Abused: Not on file  . Physically Abused: Not on file  . Sexually Abused: Not on file    FAMILY HISTORY:  Family History  Problem Relation Age of Onset  . Depression Mother   . Alcohol abuse Father     CURRENT MEDICATIONS:  Outpatient Encounter Medications as of 07/07/2019  Medication Sig  . alendronate (FOSAMAX) 70 MG tablet TAKE ONE TABLET BY MOUTH ONCE A WEEK IN THE MORNING WITH A FULL GLASS OF WATER ON AN EMPTY STOMACH. DO NOT LAY DOWN FOR 30 MINUTES.  Marland Kitchen atorvastatin (LIPITOR) 10 MG tablet Take 10 mg by mouth every morning.   . cetirizine (ZYRTEC) 10 MG tablet Take 10 mg by mouth at bedtime.  . DULoxetine (CYMBALTA) 60 MG capsule Take 1 capsule (60 mg total) by mouth 2 (two) times daily.  Marland Kitchen exemestane (AROMASIN) 25 MG tablet Take 1 tablet (25 mg total) by mouth daily after breakfast.  . lamoTRIgine (LAMICTAL) 100 MG tablet Take 1 tablet (100 mg total) by mouth at bedtime.  Marland Kitchen losartan (COZAAR) 50 MG tablet Take 50 mg by mouth every morning. Take 1/2 tablet 25 mg  . OLANZapine (ZYPREXA) 5 MG tablet Take 1 tablet (5 mg total) by mouth at bedtime.  . topiramate (TOPAMAX)  100 MG tablet Take 100 mg by mouth daily.   Marland Kitchen topiramate (TOPAMAX) 50 MG tablet Take 50 mg by mouth daily.  . vitamin B-12 (CYANOCOBALAMIN) 1000 MCG tablet Take 1,000 mcg by mouth daily.   Marland Kitchen ALPRAZolam (XANAX) 0.5 MG tablet Take 1 tablet (0.5 mg total) by mouth 2 (two) times daily as needed. for anxiety (Patient not taking: Reported on 07/07/2019)  . pantoprazole (PROTONIX) 40 MG tablet  Take 40 mg by mouth daily as needed (for acid reflux).    No facility-administered encounter medications on file as of 07/07/2019.    ALLERGIES:  Allergies  Allergen Reactions  . Codeine     Nausea/vomiting     PHYSICAL EXAM:  ECOG Performance status: 1  Vitals:   07/07/19 1453  BP: 139/71  Pulse: 73  Resp: 18  Temp: (!) 97.4 F (36.3 C)  SpO2: 100%   Filed Weights   07/07/19 1453  Weight: 143 lb (64.9 kg)    Physical Exam Constitutional:      Appearance: Normal appearance.  HENT:     Head: Normocephalic.     Right Ear: External ear normal.     Left Ear: External ear normal.  Cardiovascular:     Rate and Rhythm: Normal rate and regular rhythm.     Pulses: Normal pulses.     Heart sounds: Normal heart sounds.  Pulmonary:     Effort: Pulmonary effort is normal.  Abdominal:     General: Bowel sounds are normal.  Musculoskeletal:     Cervical back: Normal range of motion.     Comments: Decreased range of motion  Skin:    General: Skin is warm.  Neurological:     General: No focal deficit present.     Mental Status: She is alert and oriented to person, place, and time.  Psychiatric:        Mood and Affect: Mood normal.      LABORATORY DATA:  I have reviewed the labs as listed.  CBC    Component Value Date/Time   WBC 4.9 07/07/2019 1435   RBC 4.15 07/07/2019 1435   HGB 11.9 (L) 07/07/2019 1435   HCT 37.4 07/07/2019 1435   PLT 219 07/07/2019 1435   MCV 90.1 07/07/2019 1435   MCH 28.7 07/07/2019 1435   MCHC 31.8 07/07/2019 1435   RDW 13.7 07/07/2019 1435   LYMPHSABS  1.4 07/07/2019 1435   MONOABS 0.5 07/07/2019 1435   EOSABS 0.1 07/07/2019 1435   BASOSABS 0.0 07/07/2019 1435   CMP Latest Ref Rng & Units 07/07/2019 04/01/2016 02/01/2014  Glucose 70 - 99 mg/dL 86 91 92  BUN 8 - 23 mg/dL 16 24(H) 19  Creatinine 0.44 - 1.00 mg/dL 1.27(H) 1.60(H) 1.36(H)  Sodium 135 - 145 mmol/L 140 137 140  Potassium 3.5 - 5.1 mmol/L 4.2 4.0 4.3  Chloride 98 - 111 mmol/L 112(H) 109 106  CO2 22 - 32 mmol/L 21(L) 22 20  Calcium 8.9 - 10.3 mg/dL 9.9 9.8 9.6  Total Protein 6.5 - 8.1 g/dL 8.0 8.6(H) -  Total Bilirubin 0.3 - 1.2 mg/dL 0.7 0.4 -  Alkaline Phos 38 - 126 U/L 51 66 -  AST 15 - 41 U/L 19 17 -  ALT 0 - 44 U/L 15 16 -           ASSESSMENT & PLAN:   Malignant neoplasm of central portion of left female breast (HCC) 1.  Stage IIb carcinoma the left breast, pT3,pN0,pMX: - She is status post left mastectomy 02/10/2014, invasive ductal carcinoma with papillary features.  Positive for lymph vascular invasion.  With DCIS.  0 lymph nodes positive, ER/PR positive, Ki-67 20% HER-2 negative. - She has been on Aromasin since 2016.  Goal is for 10 years of therapy. -Last mammogram on 12/13/2018 showed B RADS category 1 negative. -Physical exam was negative today.  Plan to repeat mammogram in June 2021.  If stable patient may transition  to annual follow-ups.  2.Right Renal Oncocytoma: -Patient is status post partial nephrectomy at [facility]. -Patient will continue to follow-up with [facility] as recommended.  3.  Chronic kidney disease: - Creatinine has been stable at 1.54. -Patient is being closely followed by her nephrologist.      Orders placed this encounter:  Orders Placed This Encounter  Procedures  . [place] [phone]

## 2019-07-07 NOTE — Assessment & Plan Note (Signed)
1.  Stage IIb carcinoma the left breast, pT3,pN0,pMX: - She is status post left mastectomy 02/10/2014, invasive ductal carcinoma with papillary features.  Positive for lymph vascular invasion.  With DCIS.  0 lymph nodes positive, ER/PR positive, Ki-67 20% HER-2 negative. - She has been on Aromasin since 2016.  Goal is for 10 years of therapy. -Last mammogram on 12/13/2018 showed B RADS category 1 negative. -Physical exam was negative today.  Plan to repeat mammogram in June 2021.  If stable patient may transition to annual follow-ups.  2.Right Renal Oncocytoma: -Patient is status post partial nephrectomy at [facility]. -Patient will continue to follow-up with [facility] as recommended.  3.  Chronic kidney disease: - Creatinine has been stable at 1.54. -Patient is being closely followed by her nephrologist.

## 2019-07-11 DIAGNOSIS — I10 Essential (primary) hypertension: Secondary | ICD-10-CM | POA: Diagnosis not present

## 2019-07-11 DIAGNOSIS — F17210 Nicotine dependence, cigarettes, uncomplicated: Secondary | ICD-10-CM | POA: Diagnosis not present

## 2019-07-11 DIAGNOSIS — Z6828 Body mass index (BMI) 28.0-28.9, adult: Secondary | ICD-10-CM | POA: Diagnosis not present

## 2019-07-11 DIAGNOSIS — M3500 Sicca syndrome, unspecified: Secondary | ICD-10-CM | POA: Diagnosis not present

## 2019-07-11 DIAGNOSIS — Z299 Encounter for prophylactic measures, unspecified: Secondary | ICD-10-CM | POA: Diagnosis not present

## 2019-08-01 DIAGNOSIS — I10 Essential (primary) hypertension: Secondary | ICD-10-CM | POA: Diagnosis not present

## 2019-08-01 DIAGNOSIS — F329 Major depressive disorder, single episode, unspecified: Secondary | ICD-10-CM | POA: Diagnosis not present

## 2019-08-01 DIAGNOSIS — E7800 Pure hypercholesterolemia, unspecified: Secondary | ICD-10-CM | POA: Diagnosis not present

## 2019-08-23 DIAGNOSIS — E7800 Pure hypercholesterolemia, unspecified: Secondary | ICD-10-CM | POA: Diagnosis not present

## 2019-08-23 DIAGNOSIS — F329 Major depressive disorder, single episode, unspecified: Secondary | ICD-10-CM | POA: Diagnosis not present

## 2019-08-23 DIAGNOSIS — I10 Essential (primary) hypertension: Secondary | ICD-10-CM | POA: Diagnosis not present

## 2019-09-05 IMAGING — MG DIGITAL SCREENING UNILATERAL RIGHT MAMMOGRAM WITH CAD
4 series · 4 of 12 positions shown · non-contrast
Comparison: Previous exam(s).

CLINICAL DATA: Screening.

EXAM:
DIGITAL SCREENING UNILATERAL RIGHT MAMMOGRAM WITH CAD

[R MLO synth-2D]
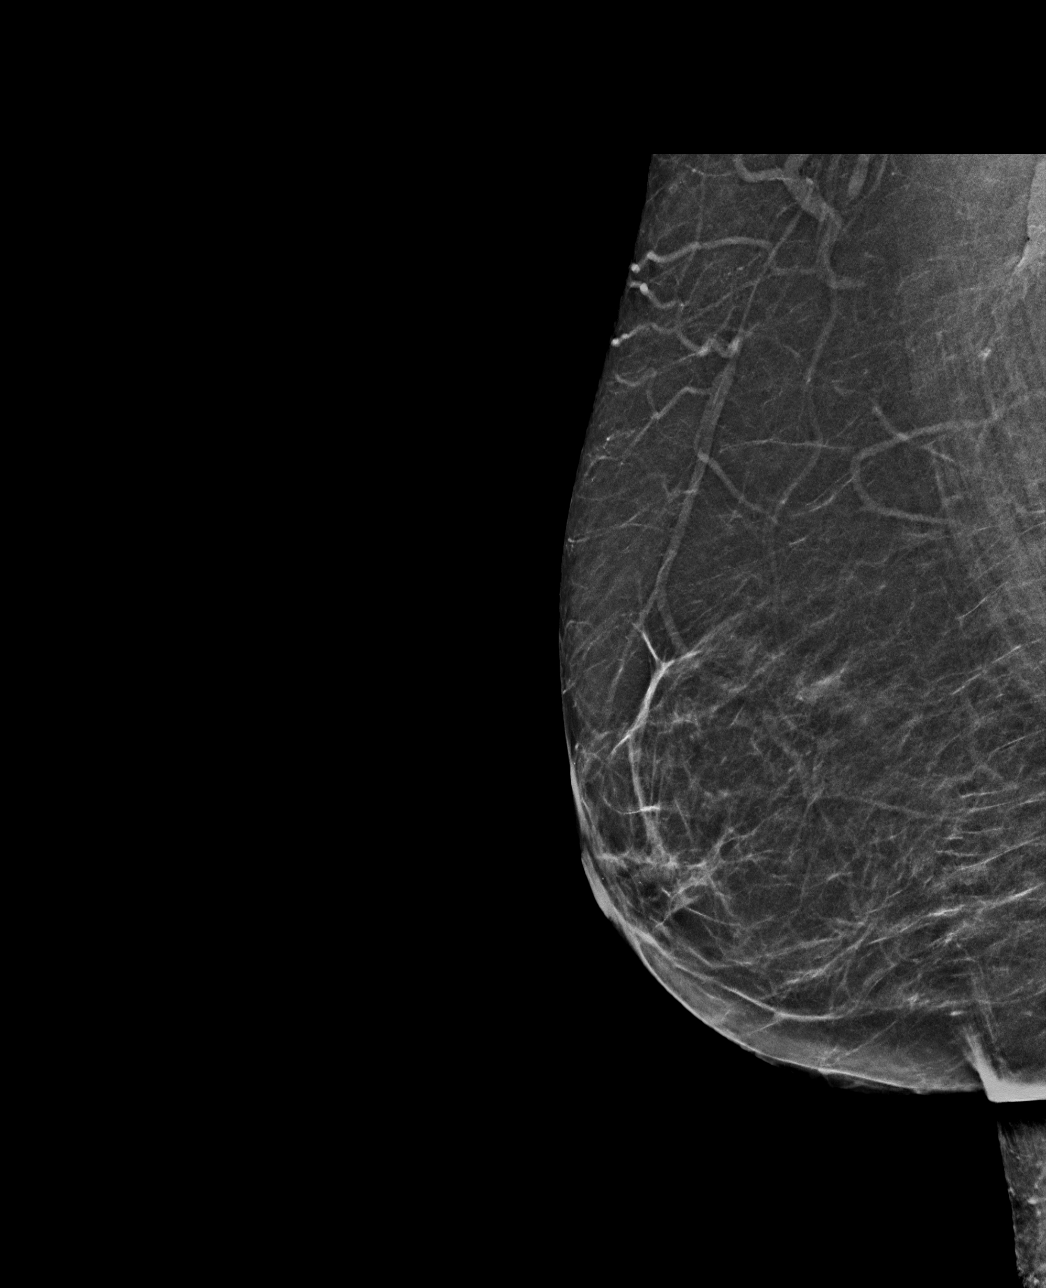

[R CC synth-2D]
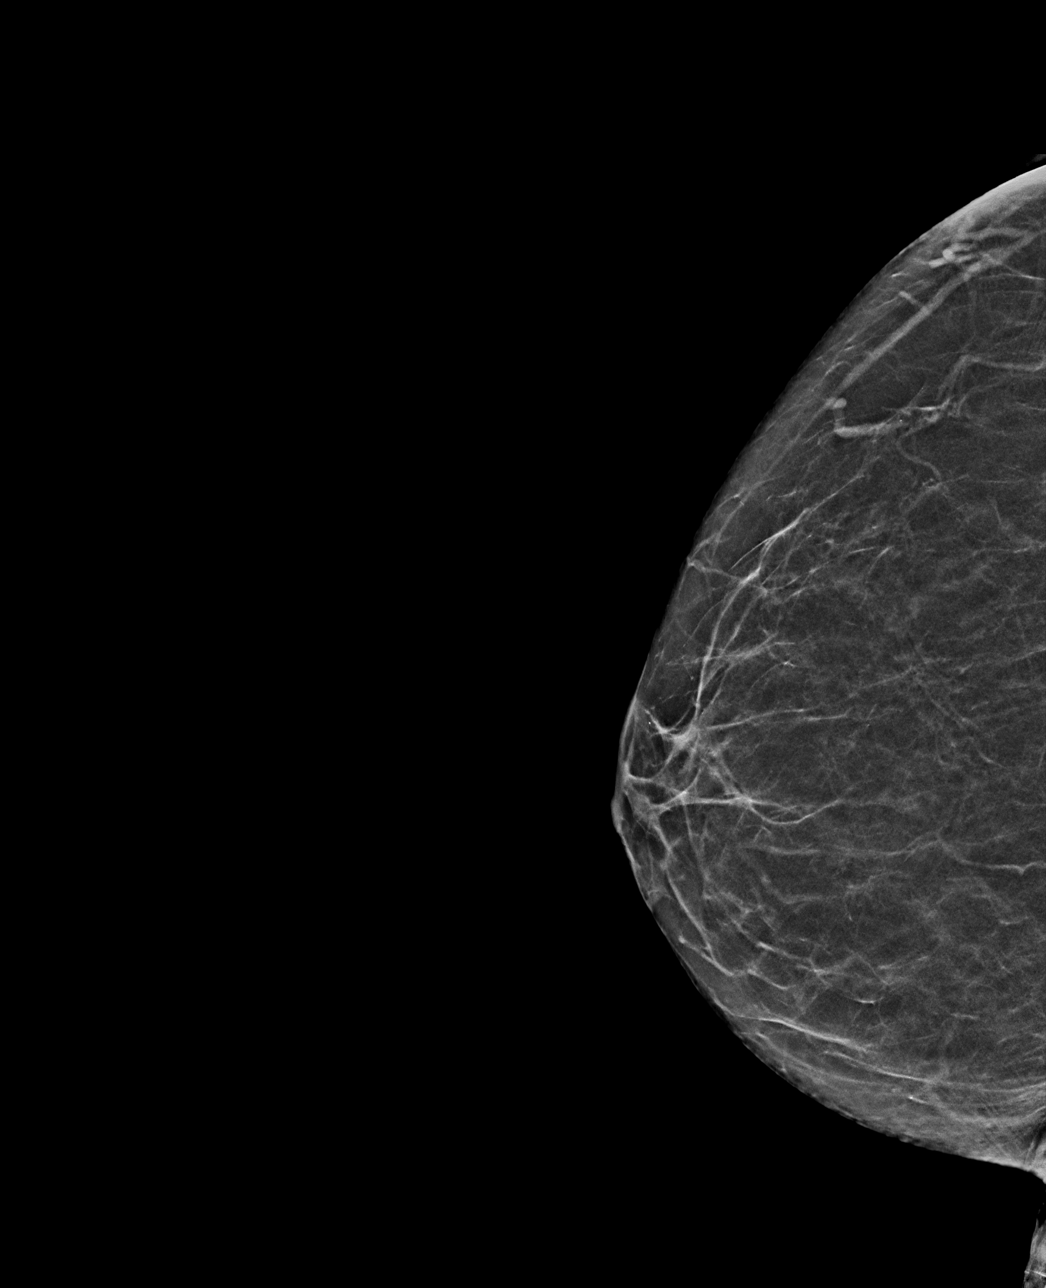

[R CC tomo · tomo slice 24/47.0]
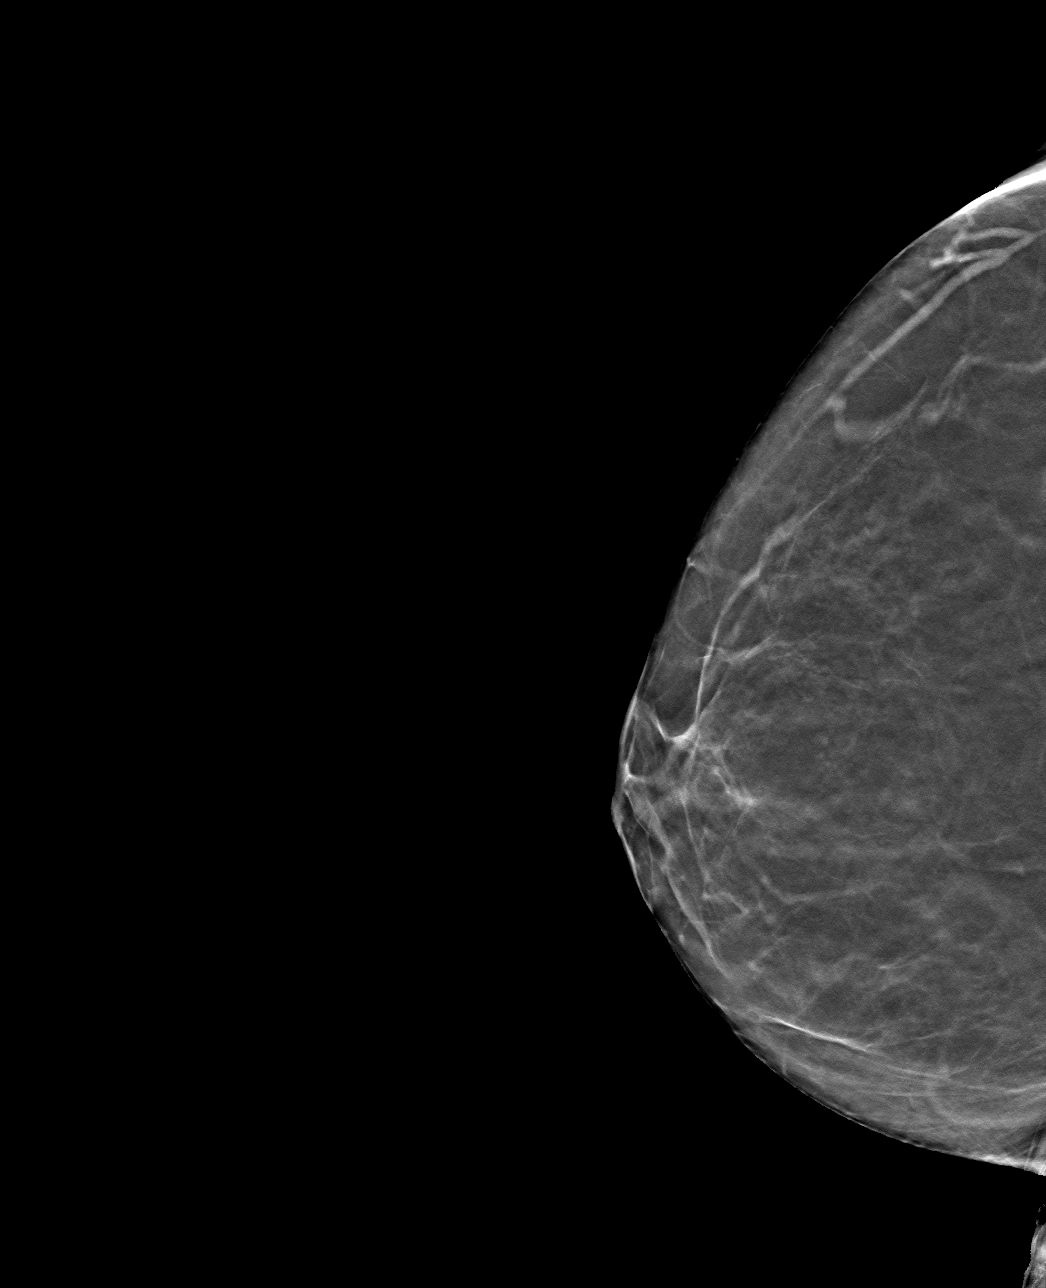

[R MLO tomo · tomo slice 27/52.0]
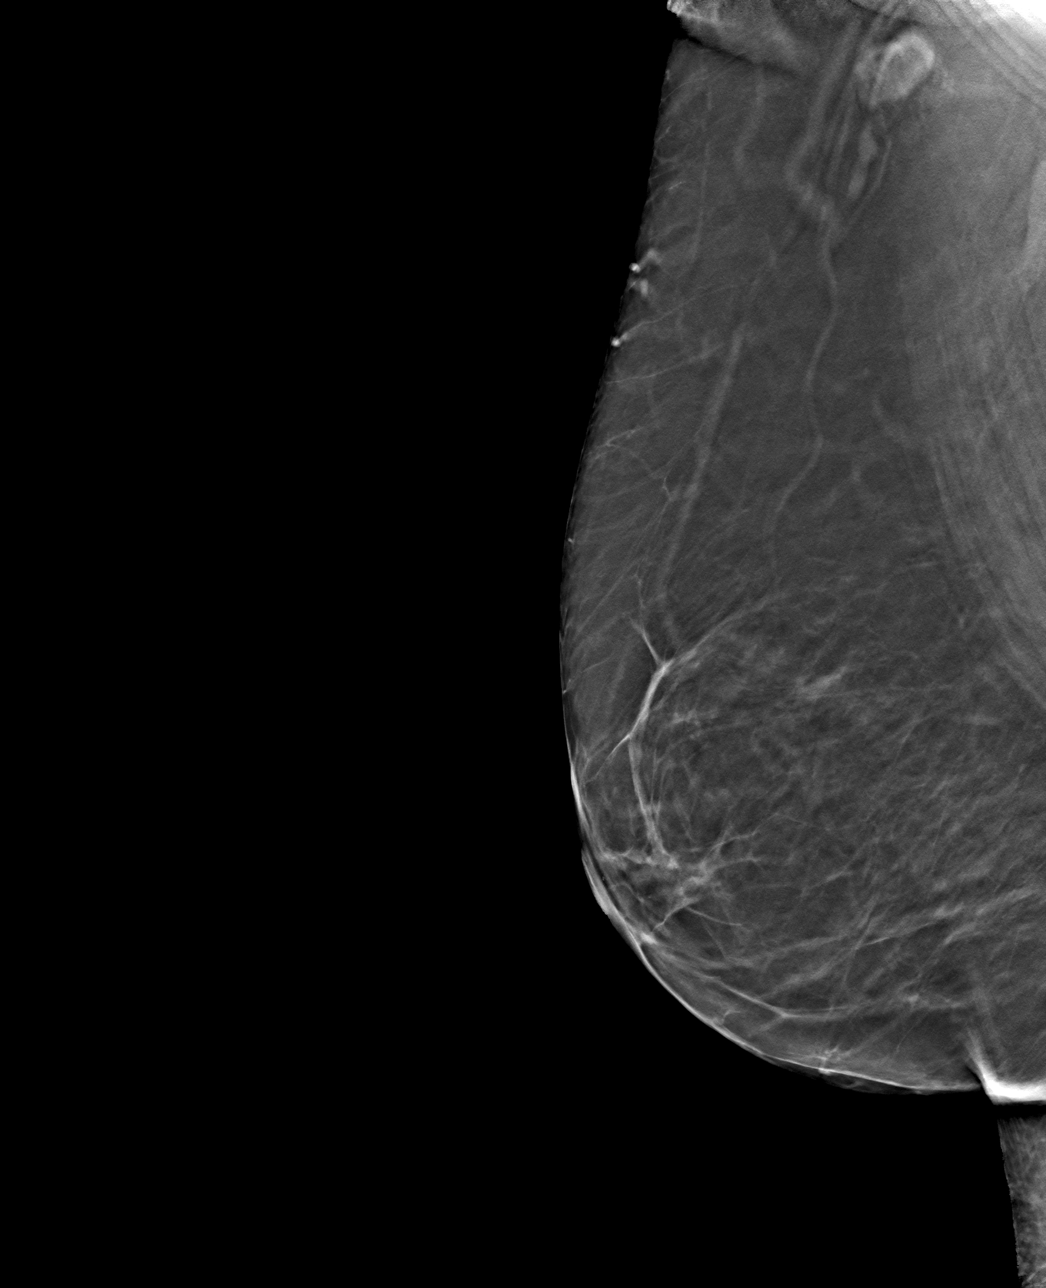

[4 of 12 positions shown; findings below may reference images not displayed]

ACR Breast Density Category b: There are scattered areas of
fibroglandular density.
FINDINGS: The patient has had a left mastectomy. There are no findings
suspicious for malignancy. Images were processed with CAD.
IMPRESSION: No mammographic evidence of malignancy. A result letter of this
screening mammogram will be mailed directly to the patient.

RECOMMENDATION:
Screening mammogram in one year.  (Code:[id number]-X-55W)

BI-RADS CATEGORY  1: Negative.

## 2019-09-12 ENCOUNTER — Other Ambulatory Visit (HOSPITAL_COMMUNITY): Payer: Self-pay | Admitting: Psychiatry

## 2019-09-20 ENCOUNTER — Ambulatory Visit (INDEPENDENT_AMBULATORY_CARE_PROVIDER_SITE_OTHER): Payer: Medicare HMO | Admitting: Psychiatry

## 2019-09-20 ENCOUNTER — Encounter (HOSPITAL_COMMUNITY): Payer: Self-pay | Admitting: Psychiatry

## 2019-09-20 ENCOUNTER — Other Ambulatory Visit: Payer: Self-pay

## 2019-09-20 DIAGNOSIS — F331 Major depressive disorder, recurrent, moderate: Secondary | ICD-10-CM | POA: Diagnosis not present

## 2019-09-20 MED ORDER — ALPRAZOLAM 0.5 MG PO TABS: 0.5000 mg | ORAL_TABLET | Freq: Two times a day (BID) | ORAL | 2 refills | Status: DC | PRN

## 2019-09-20 MED ORDER — LAMOTRIGINE 100 MG PO TABS: 100.0000 mg | ORAL_TABLET | Freq: Every day | ORAL | 2 refills | Status: DC

## 2019-09-20 MED ORDER — DULOXETINE HCL 60 MG PO CPEP: 60.0000 mg | ORAL_CAPSULE | Freq: Two times a day (BID) | ORAL | 2 refills | Status: DC

## 2019-09-20 MED ORDER — OLANZAPINE 5 MG PO TABS: 5.0000 mg | ORAL_TABLET | Freq: Every day | ORAL | 2 refills | Status: DC

## 2019-09-20 NOTE — Progress Notes (Signed)
Virtual Visit via Telephone Note  I connected with Caitlin [name] on 09/20/19 at 11:00 AM EDT by telephone and verified that I am speaking with the correct person using two identifiers.   I discussed the limitations, risks, security and privacy concerns of performing an evaluation and management service by telephone and the availability of in person appointments. I also discussed with the patient that there may be a patient responsible charge related to this service. The patient expressed understanding and agreed to proceed.    I discussed the assessment and treatment plan with the patient. The patient was provided an opportunity to ask questions and all were answered. The patient agreed with the plan and demonstrated an understanding of the instructions.   The patient was advised to call back or seek an in-person evaluation if the symptoms worsen or if the condition fails to improve as anticipated.  I provided 15 minutes of non-face-to-face time during this encounter.   Caitlin Spiller, MD  [facility] MD/PA/NP OP Progress Note  09/20/2019 11:08 AM Caitlin [name]  MRN:  [id number]  Chief Complaint:  Chief Complaint    Depression; Anxiety; Follow-up     HPI: Thispatient is a 69 year old married white female who lives with her husband in [place]. She has 4 children and 6 grandchildren. She worked in a [employer] until they let her go in 2005. She is self-referred.  The patient states that her depression started in the late 90s. She had to have an emergency colostomy due to diverticulosis. She had complications from the surgery and had to have a hernia repair and several other surgeries on her abdomen. She is left with a huge scar which lowered her self-confidence and self-esteem. She also suffers from migraine headaches.  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 Since then she's really gone downhill. She's become increasingly depressed and anxious. She's been seen at  [facility] since 2009 but feels like the most recent psychiatrist does not listen to her and changed all the medications that were helpful. Currently she has lots of headaches, she's tired all the time and doesn't leave the house. She feels like she is a burden to everyone around her and has passive suicidal ideation but no plan. She sleeps okay her appetite has diminished.  In 2011 she was admitted to [facility] because she was depressed and psychotic. She thinks this was a reaction to a medication that she got from migraine headache. She doesn't recall the name of the medicine. She was not hospitalized since then has had little therapy  The patient returns for follow-up after 3 months.  She states that for the most part she is doing okay.  Her health has been good and she has not developed any new health issues.  She is still smoking about 7 to 8 cigarettes a day and is having a difficult time quitting.  She still wakes up about 3 or 4 in the morning and cannot go back to sleep so she gets up and sometimes takes a nap during the day.  She watches a lot of television and is not very active and I encouraged her to go outside and get fresh air and increase her activity level.  She and her husband have gotten their coronavirus vaccines and this is given her some degree of comfort.  She denies severe depression suicidal ideation or significant anxiety crying spells. Visit Diagnosis:    ICD-10-CM   1. Major  depressive disorder, recurrent episode, moderate ([place])  F33.1     Past Psychiatric History: 1 psychiatric hospitalization in 2011  Past Medical History:  Past Medical History:  Diagnosis Date  . Anemia many yrs ago   not on any meds  . Anxiety    takes Xanax daily as needed  . Breast cancer ([place])   . Cataracts, bilateral    immature  . Depression    takes Cymbalta daily  . Diverticulitis   . GERD (gastroesophageal reflux disease)    takes Pantoprazole daily as needed  .  Headache(784.0)    takes Topamax nightly;last migraine has been a while  . History of blood transfusion    no abnormal reaction noted  . History of bronchitis around 2005  . History of kidney stones   . History of shingles   . Hyperlipidemia    takes Atorvastatin daily  . Hypertension    takes Losaratn daily  . Insomnia    takes Zyprexa nightly  . Pneumonia around 2005    Past Surgical History:  Procedure Laterality Date  . BREAST RECONSTRUCTION WITH PLACEMENT OF TISSUE EXPANDER AND FLEX HD (ACELLULAR HYDRATED DERMIS) Left 02/10/2014   Procedure: LEFT BREAST RECONSTRUCTION WITH PLACEMENT OF TISSUE EXPANDER AND  FLEX HD (ACELLULAR HYDRATED DERMIS);  Surgeon: Crissie Reese, MD;  Location: [facility];  Service: Plastics;  Laterality: Left;  . COLON SURGERY     d/t diverticulitis  . colostomy take down    . ESOPHAGOGASTRODUODENOSCOPY    . HERNIA REPAIR  2014  . INCISIONAL HERNIA REPAIR    . MASTECTOMY Left   . R kidney tumor removed    . SIMPLE MASTECTOMY WITH AXILLARY SENTINEL NODE BIOPSY Left 02/10/2014   Procedure: LEFT TOTAL MASTECTOMY WITH AXILLARY SENTINEL NODE BIOPSY;  Surgeon: Edward Jolly, MD;  Location: [facility];  Service: General;  Laterality: Left;  . TONSILLECTOMY    . TUBAL LIGATION      Family Psychiatric History: see below  Family History:  Family History  Problem Relation Age of Onset  . Depression Mother   . Alcohol abuse Father     Social History:  Social History   Socioeconomic History  . Marital status: Married    Spouse name: Not on file  . Number of children: Not on file  . Years of education: Not on file  . Highest education level: Not on file  Occupational History  . Not on file  Tobacco Use  . Smoking status: Former Research scientist (life sciences)  . Smokeless tobacco: Never Used  . Tobacco comment: quit smoking almost 3 yrs ago  Substance and Sexual Activity  . Alcohol use: No  . Drug use: No  . Sexual activity: Not Currently    Birth control/protection:  Post-menopausal  Other Topics Concern  . Not on file  Social History Narrative  . Not on file   Social Determinants of Health   Financial Resource Strain:   . Difficulty of Paying Living Expenses:   Food Insecurity:   . Worried About Charity fundraiser in the Last Year:   . Arboriculturist in the Last Year:   Transportation Needs:   . Film/video editor (Medical):   Marland Kitchen Lack of Transportation (Non-Medical):   Physical Activity:   . Days of Exercise per Week:   . Minutes of Exercise per Session:   Stress:   . Feeling of Stress :   Social Connections:   . Frequency of Communication with Friends and Family:   .  Frequency of Social Gatherings with Friends and Family:   . Attends Religious Services:   . Active Member of Clubs or Organizations:   . Attends Archivist Meetings:   Marland Kitchen Marital Status:     Allergies:  Allergies  Allergen Reactions  . Codeine     Nausea/vomiting    Metabolic Disorder Labs: No results found for: HGBA1C, MPG No results found for: PROLACTIN No results found for: CHOL, TRIG, HDL, CHOLHDL, VLDL, LDLCALC No results found for: TSH  Therapeutic Level Labs: No results found for: LITHIUM No results found for: VALPROATE No components found for:  CBMZ  Current Medications: Current Outpatient Medications  Medication Sig Dispense Refill  . alendronate (FOSAMAX) 70 MG tablet TAKE ONE TABLET BY MOUTH ONCE A WEEK IN THE MORNING WITH A FULL GLASS OF WATER ON AN EMPTY STOMACH. DO NOT LAY DOWN FOR 30 MINUTES.    Marland Kitchen ALPRAZolam (XANAX) 0.5 MG tablet Take 1 tablet (0.5 mg total) by mouth 2 (two) times daily as needed. for anxiety 60 tablet 2  . atorvastatin (LIPITOR) 10 MG tablet Take 10 mg by mouth every morning.     . cetirizine (ZYRTEC) 10 MG tablet Take 10 mg by mouth at bedtime.  12  . DULoxetine (CYMBALTA) 60 MG capsule Take 1 capsule (60 mg total) by mouth 2 (two) times daily. 60 capsule 2  . exemestane (AROMASIN) 25 MG tablet Take 1 tablet (25  mg total) by mouth daily after breakfast. 90 tablet 4  . lamoTRIgine (LAMICTAL) 100 MG tablet Take 1 tablet (100 mg total) by mouth at bedtime. 30 tablet 2  . losartan (COZAAR) 50 MG tablet Take 50 mg by mouth every morning. Take 1/2 tablet 25 mg    . OLANZapine (ZYPREXA) 5 MG tablet Take 1 tablet (5 mg total) by mouth at bedtime. 30 tablet 2  . pantoprazole (PROTONIX) 40 MG tablet Take 40 mg by mouth daily as needed (for acid reflux).     . topiramate (TOPAMAX) 100 MG tablet Take 100 mg by mouth daily.     Marland Kitchen topiramate (TOPAMAX) 50 MG tablet Take 50 mg by mouth daily.    . vitamin B-12 (CYANOCOBALAMIN) 1000 MCG tablet Take 1,000 mcg by mouth daily.      No current facility-administered medications for this visit.     Musculoskeletal: Strength & Muscle Tone: within normal limits Gait & Station: normal Patient leans: N/A  Psychiatric Specialty Exam: Review of Systems  Constitutional: Positive for fatigue.  Psychiatric/Behavioral: Positive for sleep disturbance.  All other systems reviewed and are negative.   There were no vitals taken for this visit.There is no height or weight on file to calculate BMI.  General Appearance: NA  Eye Contact:  NA  Speech:  Clear and Coherent  Volume:  Normal  Mood:  Euthymic  Affect:  NA  Thought Process:  Goal Directed  Orientation:  Full (Time, Place, and Person)  Thought Content: WDL   Suicidal Thoughts:  No  Homicidal Thoughts:  No  Memory:  Immediate;   Good Recent;   Good Remote;   Fair  Judgement:  Good  Insight:  Good  Psychomotor Activity:  Normal  Concentration:  Concentration: Good and Attention Span: Good  Recall:  Good  Fund of Knowledge: Good  Language: Good  Akathisia:  No  Handed:  Right  AIMS (if indicated): not done  Assets:  Communication Skills Desire for Improvement Resilience Social Support Talents/Skills  ADL's:  Intact  Cognition: WNL  Sleep:  Fair   Screenings:   Assessment and Plan: This patient is a  69 year old female with a history of depression and anxiety.  For the most part she is doing well but I encouraged her to be more active during the day and not take naps so perhaps she will sleep better at night.  She will continue Cymbalta 60 mg twice daily for depression, Lamictal 100 mg at bedtime for mood stabilization, olanzapine 5 mg at bedtime for mood stabilization and Xanax 0.5 mg twice daily as needed for anxiety and sleep.  She will return to see me in 3 months   Caitlin Spiller, MD 09/20/2019, 11:08 AM

## 2019-09-26 DIAGNOSIS — E7800 Pure hypercholesterolemia, unspecified: Secondary | ICD-10-CM | POA: Diagnosis not present

## 2019-09-26 DIAGNOSIS — F329 Major depressive disorder, single episode, unspecified: Secondary | ICD-10-CM | POA: Diagnosis not present

## 2019-09-26 DIAGNOSIS — I10 Essential (primary) hypertension: Secondary | ICD-10-CM | POA: Diagnosis not present

## 2019-10-11 DIAGNOSIS — I10 Essential (primary) hypertension: Secondary | ICD-10-CM | POA: Diagnosis not present

## 2019-10-11 DIAGNOSIS — C50919 Malignant neoplasm of unspecified site of unspecified female breast: Secondary | ICD-10-CM | POA: Diagnosis not present

## 2019-10-11 DIAGNOSIS — E7800 Pure hypercholesterolemia, unspecified: Secondary | ICD-10-CM | POA: Diagnosis not present

## 2019-10-11 DIAGNOSIS — Z299 Encounter for prophylactic measures, unspecified: Secondary | ICD-10-CM | POA: Diagnosis not present

## 2019-10-11 DIAGNOSIS — M3500 Sicca syndrome, unspecified: Secondary | ICD-10-CM | POA: Diagnosis not present

## 2019-11-03 DIAGNOSIS — Z0000 Encounter for general adult medical examination without abnormal findings: Secondary | ICD-10-CM | POA: Diagnosis not present

## 2019-11-03 DIAGNOSIS — Z1211 Encounter for screening for malignant neoplasm of colon: Secondary | ICD-10-CM | POA: Diagnosis not present

## 2019-11-03 DIAGNOSIS — Z6826 Body mass index (BMI) 26.0-26.9, adult: Secondary | ICD-10-CM | POA: Diagnosis not present

## 2019-11-03 DIAGNOSIS — R5383 Other fatigue: Secondary | ICD-10-CM | POA: Diagnosis not present

## 2019-11-03 DIAGNOSIS — Z7189 Other specified counseling: Secondary | ICD-10-CM | POA: Diagnosis not present

## 2019-11-03 DIAGNOSIS — Z79899 Other long term (current) drug therapy: Secondary | ICD-10-CM | POA: Diagnosis not present

## 2019-11-03 DIAGNOSIS — Z299 Encounter for prophylactic measures, unspecified: Secondary | ICD-10-CM | POA: Diagnosis not present

## 2019-11-03 DIAGNOSIS — I10 Essential (primary) hypertension: Secondary | ICD-10-CM | POA: Diagnosis not present

## 2019-11-03 DIAGNOSIS — E7800 Pure hypercholesterolemia, unspecified: Secondary | ICD-10-CM | POA: Diagnosis not present

## 2019-11-03 DIAGNOSIS — Z1339 Encounter for screening examination for other mental health and behavioral disorders: Secondary | ICD-10-CM | POA: Diagnosis not present

## 2019-11-03 DIAGNOSIS — Z1331 Encounter for screening for depression: Secondary | ICD-10-CM | POA: Diagnosis not present

## 2019-11-20 DIAGNOSIS — I10 Essential (primary) hypertension: Secondary | ICD-10-CM | POA: Diagnosis not present

## 2019-11-20 DIAGNOSIS — F329 Major depressive disorder, single episode, unspecified: Secondary | ICD-10-CM | POA: Diagnosis not present

## 2019-11-20 DIAGNOSIS — E7800 Pure hypercholesterolemia, unspecified: Secondary | ICD-10-CM | POA: Diagnosis not present

## 2019-12-21 DIAGNOSIS — I10 Essential (primary) hypertension: Secondary | ICD-10-CM | POA: Diagnosis not present

## 2019-12-21 DIAGNOSIS — F329 Major depressive disorder, single episode, unspecified: Secondary | ICD-10-CM | POA: Diagnosis not present

## 2019-12-21 DIAGNOSIS — E7800 Pure hypercholesterolemia, unspecified: Secondary | ICD-10-CM | POA: Diagnosis not present

## 2019-12-22 ENCOUNTER — Encounter (HOSPITAL_COMMUNITY): Payer: Self-pay | Admitting: Psychiatry

## 2019-12-22 ENCOUNTER — Telehealth (INDEPENDENT_AMBULATORY_CARE_PROVIDER_SITE_OTHER): Payer: Medicare HMO | Admitting: Psychiatry

## 2019-12-22 ENCOUNTER — Other Ambulatory Visit: Payer: Self-pay

## 2019-12-22 DIAGNOSIS — F331 Major depressive disorder, recurrent, moderate: Secondary | ICD-10-CM | POA: Diagnosis not present

## 2019-12-22 MED ORDER — DULOXETINE HCL 60 MG PO CPEP: 60.0000 mg | ORAL_CAPSULE | Freq: Two times a day (BID) | ORAL | 2 refills | Status: DC

## 2019-12-22 MED ORDER — VENLAFAXINE HCL ER 75 MG PO CP24: 75.0000 mg | ORAL_CAPSULE | Freq: Every day | ORAL | 2 refills | Status: DC

## 2019-12-22 MED ORDER — OLANZAPINE 5 MG PO TABS: 5.0000 mg | ORAL_TABLET | Freq: Every day | ORAL | 2 refills | Status: DC

## 2019-12-22 MED ORDER — LAMOTRIGINE 100 MG PO TABS: 100.0000 mg | ORAL_TABLET | Freq: Every day | ORAL | 2 refills | Status: DC

## 2019-12-22 MED ORDER — ALPRAZOLAM 0.5 MG PO TABS: 0.5000 mg | ORAL_TABLET | Freq: Every day | ORAL | 2 refills | Status: DC | PRN

## 2019-12-22 NOTE — Progress Notes (Signed)
Virtual Visit via Telephone Note  I connected with Caitlin [name] on 12/22/19 at  3:00 PM EDT by telephone and verified that I am speaking with the correct person using two identifiers.   I discussed the limitations, risks, security and privacy concerns of performing an evaluation and management service by telephone and the availability of in person appointments. I also discussed with the patient that there may be a patient responsible charge related to this service. The patient expressed understanding and agreed to proceed    I discussed the assessment and treatment plan with the patient. The patient was provided an opportunity to ask questions and all were answered. The patient agreed with the plan and demonstrated an understanding of the instructions.   The patient was advised to call back or seek an in-person evaluation if the symptoms worsen or if the condition fails to improve as anticipated.  I provided 15 minutes of non-face-to-face time during this encounter. Location: Provider office, patient home  [name], MD  [facility] MD/PA/NP OP Progress Note  12/22/2019 2:32 PM Caitlin [name]  MRN:  [id number]  Chief Complaint:  Chief Complaint    Depression; Anxiety; Follow-up     HPI: Thispatient is a 69 year old married white female who lives with her husband in [place]. She has 4 children and 6 grandchildren. She worked in a [employer] until they let her go in 2005. She is self-referred.  The patient states that her depression started in the late 90s. She had to have an emergency colostomy due to diverticulosis. She had complications from the surgery and had to have a hernia repair and several other surgeries on her abdomen. She is left with a huge scar which lowered her self-confidence and self-esteem. She also suffers from migraine headaches.  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 Since then she's really gone downhill. She's become increasingly  depressed and anxious. She's been seen at [facility] since 2009 but feels like the most recent psychiatrist does not listen to her and changed all the medications that were helpful. Currently she has lots of headaches, she's tired all the time and doesn't leave the house. She feels like she is a burden to everyone around her and has passive suicidal ideation but no plan. She sleeps okay her appetite has diminished.  In 2011 she was admitted to [facility] because she was depressed and psychotic. She thinks this was a reaction to a medication that she got from migraine headache. She doesn't recall the name of the medicine. She was not hospitalized since then has had little therapy  Patient returns for follow-up after 70 months.  She states that she has been a lot more depressed lately.  She is not sure why but she just cannot think of anything she likes to do that she enjoys.  She was trying sewing for a while but then lost interest.  She states her husband is on his computer doing genealogy but she is not interested in this.  She is sleeping at night and also a lot through the day.  Her energy seems to be low.  We discussed adding Wellbutrin but she claims she tried it before and it did not help.  She cannot remember all the names of other medicines she has tried.  We have tried Trintellix but she could not afford it nor could she afford Rexulti.  She denies any thoughts of self-harm or suicide.  I suggested adding  a very low dose of Effexor XR and she finally agreed.  Visit Diagnosis:    ICD-10-CM   1. Major depressive disorder, recurrent episode, moderate ([facility])  F33.1     Past Psychiatric History: 1 psychiatric hospitalization in 2011  Past Medical History:  Past Medical History:  Diagnosis Date  . Anemia many yrs ago   not on any meds  . Anxiety    takes Xanax daily as needed  . Breast cancer ([place])   . Cataracts, bilateral    immature  . Depression    takes Cymbalta daily  .  Diverticulitis   . GERD (gastroesophageal reflux disease)    takes Pantoprazole daily as needed  . Headache(784.0)    takes Topamax nightly;last migraine has been a while  . History of blood transfusion    no abnormal reaction noted  . History of bronchitis around 2005  . History of kidney stones   . History of shingles   . Hyperlipidemia    takes Atorvastatin daily  . Hypertension    takes Losaratn daily  . Insomnia    takes Zyprexa nightly  . Pneumonia around 2005    Past Surgical History:  Procedure Laterality Date  . BREAST RECONSTRUCTION WITH PLACEMENT OF TISSUE EXPANDER AND FLEX HD (ACELLULAR HYDRATED DERMIS) Left 02/10/2014   Procedure: LEFT BREAST RECONSTRUCTION WITH PLACEMENT OF TISSUE EXPANDER AND  FLEX HD (ACELLULAR HYDRATED DERMIS);  Surgeon: Crissie Reese, MD;  Location: [facility];  Service: Plastics;  Laterality: Left;  . COLON SURGERY     d/t diverticulitis  . colostomy take down    . ESOPHAGOGASTRODUODENOSCOPY    . HERNIA REPAIR  2014  . INCISIONAL HERNIA REPAIR    . MASTECTOMY Left   . R kidney tumor removed    . SIMPLE MASTECTOMY WITH AXILLARY SENTINEL NODE BIOPSY Left 02/10/2014   Procedure: LEFT TOTAL MASTECTOMY WITH AXILLARY SENTINEL NODE BIOPSY;  Surgeon: Edward Jolly, MD;  Location: [facility];  Service: General;  Laterality: Left;  . TONSILLECTOMY    . TUBAL LIGATION      Family Psychiatric History: see below  Family History:  Family History  Problem Relation Age of Onset  . Depression Mother   . Alcohol abuse Father     Social History:  Social History   Socioeconomic History  . Marital status: Married    Spouse name: Not on file  . Number of children: Not on file  . Years of education: Not on file  . Highest education level: Not on file  Occupational History  . Not on file  Tobacco Use  . Smoking status: Former Research scientist (life sciences)  . Smokeless tobacco: Never Used  . Tobacco comment: quit smoking almost 3 yrs ago  Vaping Use  . Vaping Use: Never used   Substance and Sexual Activity  . Alcohol use: No  . Drug use: No  . Sexual activity: Not Currently    Birth control/protection: Post-menopausal  Other Topics Concern  . Not on file  Social History Narrative  . Not on file   Social Determinants of Health   Financial Resource Strain:   . Difficulty of Paying Living Expenses:   Food Insecurity:   . Worried About Charity fundraiser in the Last Year:   . Arboriculturist in the Last Year:   Transportation Needs:   . Film/video editor (Medical):   Marland Kitchen Lack of Transportation (Non-Medical):   Physical Activity:   . Days of Exercise per Week:   .  Minutes of Exercise per Session:   Stress:   . Feeling of Stress :   Social Connections:   . Frequency of Communication with Friends and Family:   . Frequency of Social Gatherings with Friends and Family:   . Attends Religious Services:   . Active Member of Clubs or Organizations:   . Attends Archivist Meetings:   Marland Kitchen Marital Status:     Allergies:  Allergies  Allergen Reactions  . Codeine     Nausea/vomiting    Metabolic Disorder Labs: No results found for: HGBA1C, MPG No results found for: PROLACTIN No results found for: CHOL, TRIG, HDL, CHOLHDL, VLDL, LDLCALC No results found for: TSH  Therapeutic Level Labs: No results found for: LITHIUM No results found for: VALPROATE No components found for:  CBMZ  Current Medications: Current Outpatient Medications  Medication Sig Dispense Refill  . alendronate (FOSAMAX) 70 MG tablet TAKE ONE TABLET BY MOUTH ONCE A WEEK IN THE MORNING WITH A FULL GLASS OF WATER ON AN EMPTY STOMACH. DO NOT LAY DOWN FOR 30 MINUTES.    Marland Kitchen ALPRAZolam (XANAX) 0.5 MG tablet Take 1 tablet (0.5 mg total) by mouth daily as needed. for anxiety 30 tablet 2  . atorvastatin (LIPITOR) 10 MG tablet Take 10 mg by mouth every morning.     . cetirizine (ZYRTEC) 10 MG tablet Take 10 mg by mouth at bedtime.  12  . DULoxetine (CYMBALTA) 60 MG capsule Take 1  capsule (60 mg total) by mouth 2 (two) times daily. 60 capsule 2  . exemestane (AROMASIN) 25 MG tablet Take 1 tablet (25 mg total) by mouth daily after breakfast. 90 tablet 4  . lamoTRIgine (LAMICTAL) 100 MG tablet Take 1 tablet (100 mg total) by mouth at bedtime. 30 tablet 2  . losartan (COZAAR) 50 MG tablet Take 50 mg by mouth every morning. Take 1/2 tablet 25 mg    . OLANZapine (ZYPREXA) 5 MG tablet Take 1 tablet (5 mg total) by mouth at bedtime. 30 tablet 2  . pantoprazole (PROTONIX) 40 MG tablet Take 40 mg by mouth daily as needed (for acid reflux).     . topiramate (TOPAMAX) 100 MG tablet Take 100 mg by mouth daily.     Marland Kitchen topiramate (TOPAMAX) 50 MG tablet Take 50 mg by mouth daily.    Marland Kitchen venlafaxine XR (EFFEXOR XR) 75 MG 24 hr capsule Take 1 capsule (75 mg total) by mouth daily. 30 capsule 2  . vitamin B-12 (CYANOCOBALAMIN) 1000 MCG tablet Take 1,000 mcg by mouth daily.      No current facility-administered medications for this visit.     Musculoskeletal: Strength & Muscle Tone: within normal limits Gait & Station: normal Patient leans: N/A  Psychiatric Specialty Exam: Review of Systems  Constitutional: Positive for fatigue.  Psychiatric/Behavioral: Positive for dysphoric mood.  All other systems reviewed and are negative.   There were no vitals taken for this visit.There is no height or weight on file to calculate BMI.  General Appearance: NA  Eye Contact:  NA  Speech:  Clear and Coherent  Volume:  Decreased  Mood:  Dysphoric  Affect:  NA  Thought Process:  Goal Directed  Orientation:  Full (Time, Place, and Person)  Thought Content: Rumination   Suicidal Thoughts:  No  Homicidal Thoughts:  No  Memory:  Immediate;   Good Recent;   Good Remote;   Fair  Judgement:  Fair  Insight:  Fair  Psychomotor Activity:  Decreased  Concentration:  Concentration: Fair and Attention Span: Fair  Recall:  Good  Fund of Knowledge: Fair  Language: Good  Akathisia:  No  Handed:   Right  AIMS (if indicated): not done  Assets:  Communication Skills Desire for Improvement Resilience Social Support Talents/Skills  ADL's:  Intact  Cognition: WNL  Sleep:  Good   Screenings:   Assessment and Plan: This patient is a 69 year old female with a history of depression anxiety.  She seems to be getting more depressed lately.  We will add Effexor XR 75 mg to her regimen of Cymbalta 60 mg twice daily for depression, Lamictal 100 mg at bedtime for mood stabilization, olanzapine 5 mg at bedtime for augmentation and Xanax 0.5 mg daily as needed for anxiety and sleep.  She will return to see me in 6 weeks or call sooner as needed   [name], MD 12/22/2019, 2:32 PM

## 2019-12-30 ENCOUNTER — Other Ambulatory Visit (HOSPITAL_COMMUNITY): Payer: Self-pay | Admitting: *Deleted

## 2019-12-30 DIAGNOSIS — Z170 Estrogen receptor positive status [ER+]: Secondary | ICD-10-CM

## 2020-01-02 ENCOUNTER — Other Ambulatory Visit: Payer: Self-pay

## 2020-01-02 ENCOUNTER — Inpatient Hospital Stay (HOSPITAL_COMMUNITY): Payer: Medicare HMO | Attending: Hematology

## 2020-01-02 ENCOUNTER — Ambulatory Visit (HOSPITAL_COMMUNITY)
Admission: RE | Admit: 2020-01-02 | Discharge: 2020-01-02 | Disposition: A | Discharge status: Home / Self Care | Payer: Medicare HMO | Source: Ambulatory Visit | Attending: Hematology | Admitting: Hematology

## 2020-01-02 DIAGNOSIS — C50112 Malignant neoplasm of central portion of left female breast: Secondary | ICD-10-CM | POA: Insufficient documentation

## 2020-01-02 DIAGNOSIS — Z1231 Encounter for screening mammogram for malignant neoplasm of breast: Secondary | ICD-10-CM | POA: Diagnosis not present

## 2020-01-02 DIAGNOSIS — Z170 Estrogen receptor positive status [ER+]: Secondary | ICD-10-CM | POA: Insufficient documentation

## 2020-01-02 DIAGNOSIS — Z79811 Long term (current) use of aromatase inhibitors: Secondary | ICD-10-CM | POA: Diagnosis not present

## 2020-01-02 DIAGNOSIS — N189 Chronic kidney disease, unspecified: Secondary | ICD-10-CM | POA: Insufficient documentation

## 2020-01-02 LAB — CBC WITH DIFFERENTIAL/PLATELET
Abs Immature Granulocytes: 0.01 10*3/uL (ref 0.00–0.07)
Basophils Absolute: 0 10*3/uL (ref 0.0–0.1)
Basophils Relative: 1 %
Eosinophils Absolute: 0.1 10*3/uL (ref 0.0–0.5)
Eosinophils Relative: 2 %
HCT: 37.7 % (ref 36.0–46.0)
Hemoglobin: 11.9 g/dL — ABNORMAL LOW (ref 12.0–15.0)
Immature Granulocytes: 0 %
Lymphocytes Relative: 22 %
Lymphs Abs: 1.4 10*3/uL (ref 0.7–4.0)
MCH: 28.8 pg (ref 26.0–34.0)
MCHC: 31.6 g/dL (ref 30.0–36.0)
MCV: 91.3 fL (ref 80.0–100.0)
Monocytes Absolute: 0.7 10*3/uL (ref 0.1–1.0)
Monocytes Relative: 10 %
Neutro Abs: 4.2 10*3/uL (ref 1.7–7.7)
Neutrophils Relative %: 65 %
Platelets: 202 10*3/uL (ref 150–400)
RBC: 4.13 MIL/uL (ref 3.87–5.11)
RDW: 13.3 % (ref 11.5–15.5)
WBC: 6.5 10*3/uL (ref 4.0–10.5)
nRBC: 0 % (ref 0.0–0.2)

## 2020-01-02 LAB — COMPREHENSIVE METABOLIC PANEL
ALT: 13 U/L (ref 0–44)
AST: 16 U/L (ref 15–41)
Albumin: 3.7 g/dL (ref 3.5–5.0)
Alkaline Phosphatase: 47 U/L (ref 38–126)
Anion gap: 7 (ref 5–15)
BUN: 22 mg/dL (ref 8–23)
CO2: 21 mmol/L — ABNORMAL LOW (ref 22–32)
Calcium: 9.8 mg/dL (ref 8.9–10.3)
Chloride: 112 mmol/L — ABNORMAL HIGH (ref 98–111)
Creatinine, Ser: 1.22 mg/dL — ABNORMAL HIGH (ref 0.44–1.00)
GFR calc Af Amer: 52 mL/min — ABNORMAL LOW (ref >60)
GFR calc non Af Amer: 45 mL/min — ABNORMAL LOW (ref >60)
Glucose, Bld: 96 mg/dL (ref 70–99)
Potassium: 4.8 mmol/L (ref 3.5–5.1)
Sodium: 140 mmol/L (ref 135–145)
Total Bilirubin: 0.4 mg/dL (ref 0.3–1.2)
Total Protein: 8 g/dL (ref 6.5–8.1)

## 2020-01-02 LAB — VITAMIN B12: Vitamin B-12: 1040 pg/mL — ABNORMAL HIGH (ref 180–914)

## 2020-01-02 LAB — VITAMIN D 25 HYDROXY (VIT D DEFICIENCY, FRACTURES): Vit D, 25-Hydroxy: 42.36 ng/mL (ref 30–100)

## 2020-01-02 LAB — LACTATE DEHYDROGENASE: LDH: 111 U/L (ref 98–192)

## 2020-01-04 ENCOUNTER — Other Ambulatory Visit: Payer: Self-pay

## 2020-01-04 ENCOUNTER — Inpatient Hospital Stay (HOSPITAL_COMMUNITY): Payer: Medicare HMO | Admitting: Nurse Practitioner

## 2020-01-04 VITALS — BP 134/74 | HR 93 | Temp 97.2°F | Resp 18 | Wt 138.4 lb

## 2020-01-04 DIAGNOSIS — N189 Chronic kidney disease, unspecified: Secondary | ICD-10-CM | POA: Diagnosis not present

## 2020-01-04 DIAGNOSIS — C50112 Malignant neoplasm of central portion of left female breast: Secondary | ICD-10-CM | POA: Diagnosis not present

## 2020-01-04 DIAGNOSIS — Z170 Estrogen receptor positive status [ER+]: Secondary | ICD-10-CM | POA: Diagnosis not present

## 2020-01-04 DIAGNOSIS — Z1231 Encounter for screening mammogram for malignant neoplasm of breast: Secondary | ICD-10-CM

## 2020-01-04 DIAGNOSIS — Z79811 Long term (current) use of aromatase inhibitors: Secondary | ICD-10-CM | POA: Diagnosis not present

## 2020-01-04 MED ORDER — EXEMESTANE 25 MG PO TABS: 25.0000 mg | ORAL_TABLET | Freq: Every day | ORAL | 4 refills | Status: AC

## 2020-01-04 NOTE — Addendum Note (Signed)
Addended by: Glennie Isle on: 01/04/2020 03:35 PM   Modules accepted: Orders

## 2020-01-04 NOTE — Assessment & Plan Note (Signed)
1.  Stage IIb carcinoma the left breast, pT3,pN0,pMX: - She is status post left mastectomy 02/10/2014, invasive ductal carcinoma with papillary features.  Positive for lymph vascular invasion.  With DCIS.  0 lymph nodes positive, ER/PR positive, Ki-67 20% HER-2 negative. - She has been on Aromasin since 2016.  Goal is for 10 years of therapy. -Last mammogram on 12/13/2018 showed B RADS category 1 negative. -Last mammogram on 01/02/2020 results pending. - She will follow-up in 1 year with repeat labs and mammogram.  2.Right Renal Oncocytoma: -Patient is status post partial nephrectomy at [facility]. -Patient will continue to follow-up with [facility] as recommended.  3.  Chronic kidney disease: -Labs done on 01/02/2020 showed creatinine 1.22 -Patient is being closely followed by her nephrologist.

## 2020-01-04 NOTE — Progress Notes (Signed)
[address] [place], [place] [address]   CLINIC:  Medical Oncology/Hematology  PCP:  Glenda Chroman, MD [place] [address] [phone]   REASON FOR VISIT: Follow-up for breast cancer   CURRENT THERAPY: aromasin  BRIEF ONCOLOGIC HISTORY:  Oncology History  Malignant neoplasm of central portion of left female breast ([facility])  02/10/2014 Surgery   Left modified radical mastectomy with sentinel LN X 1   02/10/2014 Pathology Results   6.5 cm IDC, ER+ 100%, PR + 96%, Ki 67 20% HER 2 -, grade II, LVI +, negative margins 1/1 negative sentinel nodes   10/11/2014 -  Anti-estrogen oral therapy   Exemestane 25 mg daily     INTERVAL HISTORY:  Ms. Haran 69 y.o. female returns for routine follow-up for breast cancer.  Patient reports she is feeling well since her last visit.  She denies any new lumps or bumps present.  She denies any side effects from her medication. Denies any nausea, vomiting, or diarrhea. Denies any new pains. Had not noticed any recent bleeding such as epistaxis, hematuria or hematochezia. Denies recent chest pain on exertion, shortness of breath on minimal exertion, pre-syncopal episodes, or palpitations. Denies any numbness or tingling in hands or feet. Denies any recent fevers, infections, or recent hospitalizations. Patient reports appetite at 100% and energy level at 100%.     REVIEW OF SYSTEMS:  Review of Systems  Respiratory: Positive for cough.   Neurological: Positive for headaches.  All other systems reviewed and are negative.    PAST MEDICAL/SURGICAL HISTORY:  Past Medical History:  Diagnosis Date  . Anemia many yrs ago   not on any meds  . Anxiety    takes Xanax daily as needed  . Breast cancer ([place])   . Cataracts, bilateral    immature  . Depression    takes Cymbalta daily  . Diverticulitis   . GERD (gastroesophageal reflux disease)    takes Pantoprazole daily as needed  . Headache(784.0)    takes  Topamax nightly;last migraine has been a while  . History of blood transfusion    no abnormal reaction noted  . History of bronchitis around 2005  . History of kidney stones   . History of shingles   . Hyperlipidemia    takes Atorvastatin daily  . Hypertension    takes Losaratn daily  . Insomnia    takes Zyprexa nightly  . Pneumonia around 2005   Past Surgical History:  Procedure Laterality Date  . BREAST RECONSTRUCTION WITH PLACEMENT OF TISSUE EXPANDER AND FLEX HD (ACELLULAR HYDRATED DERMIS) Left 02/10/2014   Procedure: LEFT BREAST RECONSTRUCTION WITH PLACEMENT OF TISSUE EXPANDER AND  FLEX HD (ACELLULAR HYDRATED DERMIS);  Surgeon: Crissie Reese, MD;  Location: [facility];  Service: Plastics;  Laterality: Left;  . COLON SURGERY     d/t diverticulitis  . colostomy take down    . ESOPHAGOGASTRODUODENOSCOPY    . HERNIA REPAIR  2014  . INCISIONAL HERNIA REPAIR    . MASTECTOMY Left   . R kidney tumor removed    . SIMPLE MASTECTOMY WITH AXILLARY SENTINEL NODE BIOPSY Left 02/10/2014   Procedure: LEFT TOTAL MASTECTOMY WITH AXILLARY SENTINEL NODE BIOPSY;  Surgeon: Edward Jolly, MD;  Location: [facility];  Service: General;  Laterality: Left;  . TONSILLECTOMY    . TUBAL LIGATION       SOCIAL HISTORY:  Social History   Socioeconomic History  . Marital status: Married  Spouse name: Not on file  . Number of children: Not on file  . Years of education: Not on file  . Highest education level: Not on file  Occupational History  . Not on file  Tobacco Use  . Smoking status: Former Research scientist (life sciences)  . Smokeless tobacco: Never Used  . Tobacco comment: quit smoking almost 3 yrs ago  Vaping Use  . Vaping Use: Never used  Substance and Sexual Activity  . Alcohol use: No  . Drug use: No  . Sexual activity: Not Currently    Birth control/protection: Post-menopausal  Other Topics Concern  . Not on file  Social History Narrative  . Not on file   Social Determinants of Health   Financial  Resource Strain:   . Difficulty of Paying Living Expenses:   Food Insecurity:   . Worried About Charity fundraiser in the Last Year:   . Arboriculturist in the Last Year:   Transportation Needs:   . Film/video editor (Medical):   Marland Kitchen Lack of Transportation (Non-Medical):   Physical Activity:   . Days of Exercise per Week:   . Minutes of Exercise per Session:   Stress:   . Feeling of Stress :   Social Connections:   . Frequency of Communication with Friends and Family:   . Frequency of Social Gatherings with Friends and Family:   . Attends Religious Services:   . Active Member of Clubs or Organizations:   . Attends Archivist Meetings:   Marland Kitchen Marital Status:   Intimate Partner Violence:   . Fear of Current or Ex-Partner:   . Emotionally Abused:   Marland Kitchen Physically Abused:   . Sexually Abused:     FAMILY HISTORY:  Family History  Problem Relation Age of Onset  . Depression Mother   . Alcohol abuse Father     CURRENT MEDICATIONS:  Outpatient Encounter Medications as of 01/04/2020  Medication Sig  . alendronate (FOSAMAX) 70 MG tablet TAKE ONE TABLET BY MOUTH ONCE A WEEK IN THE MORNING WITH A FULL GLASS OF WATER ON AN EMPTY STOMACH. DO NOT LAY DOWN FOR 30 MINUTES.  Marland Kitchen ALPRAZolam (XANAX) 0.5 MG tablet Take 1 tablet (0.5 mg total) by mouth daily as needed. for anxiety  . atorvastatin (LIPITOR) 10 MG tablet Take 10 mg by mouth every morning.   . cetirizine (ZYRTEC) 10 MG tablet Take 10 mg by mouth at bedtime.  . DULoxetine (CYMBALTA) 60 MG capsule Take 1 capsule (60 mg total) by mouth 2 (two) times daily.  Marland Kitchen exemestane (AROMASIN) 25 MG tablet Take 1 tablet (25 mg total) by mouth daily after breakfast.  . lamoTRIgine (LAMICTAL) 100 MG tablet Take 1 tablet (100 mg total) by mouth at bedtime.  Marland Kitchen losartan (COZAAR) 50 MG tablet Take 50 mg by mouth every morning. Take 1/2 tablet 25 mg  . OLANZapine (ZYPREXA) 5 MG tablet Take 1 tablet (5 mg total) by mouth at bedtime.  .  pantoprazole (PROTONIX) 40 MG tablet Take 40 mg by mouth daily as needed (for acid reflux).   . topiramate (TOPAMAX) 100 MG tablet Take 100 mg by mouth daily.   Marland Kitchen topiramate (TOPAMAX) 50 MG tablet Take 50 mg by mouth daily.  . vitamin B-12 (CYANOCOBALAMIN) 1000 MCG tablet Take 1,000 mcg by mouth daily.   Marland Kitchen venlafaxine XR (EFFEXOR XR) 75 MG 24 hr capsule Take 1 capsule (75 mg total) by mouth daily. (Patient not taking: Reported on 01/04/2020)   No facility-administered  encounter medications on file as of 01/04/2020.    ALLERGIES:  Allergies  Allergen Reactions  . Codeine     Nausea/vomiting     PHYSICAL EXAM:  ECOG Performance status: 1  Vitals:   01/04/20 1248  BP: 134/74  Pulse: 93  Resp: 18  Temp: (!) 97.2 F (36.2 C)  SpO2: 98%   Filed Weights   01/04/20 1248  Weight: 138 lb 7.2 oz (62.8 kg)   Physical Exam Constitutional:      Appearance: Normal appearance. She is normal weight.  Cardiovascular:     Rate and Rhythm: Normal rate and regular rhythm.     Heart sounds: Normal heart sounds.  Pulmonary:     Effort: Pulmonary effort is normal.     Breath sounds: Normal breath sounds.  Abdominal:     General: Bowel sounds are normal.     Palpations: Abdomen is soft.  Musculoskeletal:        General: Normal range of motion.  Skin:    General: Skin is warm.  Neurological:     Mental Status: She is alert and oriented to person, place, and time. Mental status is at baseline.  Psychiatric:        Mood and Affect: Mood normal.        Behavior: Behavior normal.        Thought Content: Thought content normal.        Judgment: Judgment normal.      LABORATORY DATA:  I have reviewed the labs as listed.  CBC    Component Value Date/Time   WBC 6.5 01/02/2020 1407   RBC 4.13 01/02/2020 1407   HGB 11.9 (L) 01/02/2020 1407   HCT 37.7 01/02/2020 1407   PLT 202 01/02/2020 1407   MCV 91.3 01/02/2020 1407   MCH 28.8 01/02/2020 1407   MCHC 31.6 01/02/2020 1407   RDW  13.3 01/02/2020 1407   LYMPHSABS 1.4 01/02/2020 1407   MONOABS 0.7 01/02/2020 1407   EOSABS 0.1 01/02/2020 1407   BASOSABS 0.0 01/02/2020 1407   CMP Latest Ref Rng & Units 01/02/2020 07/07/2019 04/01/2016  Glucose 70 - 99 mg/dL 96 86 91  BUN 8 - 23 mg/dL 22 16 24(H)  Creatinine 0.44 - 1.00 mg/dL 1.22(H) 1.27(H) 1.60(H)  Sodium 135 - 145 mmol/L 140 140 137  Potassium 3.5 - 5.1 mmol/L 4.8 4.2 4.0  Chloride 98 - 111 mmol/L 112(H) 112(H) 109  CO2 22 - 32 mmol/L 21(L) 21(L) 22  Calcium 8.9 - 10.3 mg/dL 9.8 9.9 9.8  Total Protein 6.5 - 8.1 g/dL 8.0 8.0 8.6(H)  Total Bilirubin 0.3 - 1.2 mg/dL 0.4 0.7 0.4  Alkaline Phos 38 - 126 U/L 47 51 66  AST 15 - 41 U/L '16 19 17  ' ALT 0 - 44 U/L '13 15 16    ' All questions were answered to patient's stated satisfaction. Encouraged patient to call with any new concerns or questions before his next visit to the cancer center and we can certain see him sooner, if needed.     ASSESSMENT & PLAN:  Malignant neoplasm of central portion of left female breast ([place]) 1.  Stage IIb carcinoma the left breast, pT3,pN0,pMX: - She is status post left mastectomy 02/10/2014, invasive ductal carcinoma with papillary features.  Positive for lymph vascular invasion.  With DCIS.  0 lymph nodes positive, ER/PR positive, Ki-67 20% HER-2 negative. - She has been on Aromasin since 2016.  Goal is for 10 years of therapy. -Last mammogram on 12/13/2018  showed B RADS category 1 negative. -Last mammogram on 01/02/2020 results pending. - She will follow-up in 1 year with repeat labs and mammogram.  2.Right Renal Oncocytoma: -Patient is status post partial nephrectomy at [facility]. -Patient will continue to follow-up with [facility] as recommended.  3.  Chronic kidney disease: -Labs done on 01/02/2020 showed creatinine 1.22 -Patient is being closely followed by her nephrologist.     Orders placed this encounter:  Orders Placed This Encounter  Procedures  . MM 3D SCREEN BREAST UNI  RIGHT  . Lactate dehydrogenase  . CBC with Differential/Platelet  . Comprehensive metabolic panel  . Vitamin B12  . VITAMIN D 25 Hydroxy (Vit-D Deficiency, Fractures)      Francene Finders, FNP-C [place] [phone]

## 2020-01-20 DIAGNOSIS — E7800 Pure hypercholesterolemia, unspecified: Secondary | ICD-10-CM | POA: Diagnosis not present

## 2020-01-20 DIAGNOSIS — F329 Major depressive disorder, single episode, unspecified: Secondary | ICD-10-CM | POA: Diagnosis not present

## 2020-01-20 DIAGNOSIS — I10 Essential (primary) hypertension: Secondary | ICD-10-CM | POA: Diagnosis not present

## 2020-02-06 DIAGNOSIS — E7800 Pure hypercholesterolemia, unspecified: Secondary | ICD-10-CM | POA: Diagnosis not present

## 2020-02-06 DIAGNOSIS — F329 Major depressive disorder, single episode, unspecified: Secondary | ICD-10-CM | POA: Diagnosis not present

## 2020-02-06 DIAGNOSIS — I10 Essential (primary) hypertension: Secondary | ICD-10-CM | POA: Diagnosis not present

## 2020-02-15 DIAGNOSIS — H524 Presbyopia: Secondary | ICD-10-CM | POA: Diagnosis not present

## 2020-02-15 DIAGNOSIS — H52223 Regular astigmatism, bilateral: Secondary | ICD-10-CM | POA: Diagnosis not present

## 2020-02-15 DIAGNOSIS — H2513 Age-related nuclear cataract, bilateral: Secondary | ICD-10-CM | POA: Diagnosis not present

## 2020-02-15 DIAGNOSIS — H5213 Myopia, bilateral: Secondary | ICD-10-CM | POA: Diagnosis not present

## 2020-03-08 ENCOUNTER — Other Ambulatory Visit (HOSPITAL_COMMUNITY): Payer: Self-pay | Admitting: Psychiatry

## 2020-03-08 NOTE — Telephone Encounter (Signed)
Call for appt next month

## 2020-03-14 ENCOUNTER — Telehealth (INDEPENDENT_AMBULATORY_CARE_PROVIDER_SITE_OTHER): Payer: Medicare HMO | Admitting: Psychiatry

## 2020-03-14 ENCOUNTER — Encounter (HOSPITAL_COMMUNITY): Payer: Self-pay | Admitting: Psychiatry

## 2020-03-14 ENCOUNTER — Other Ambulatory Visit: Payer: Self-pay

## 2020-03-14 DIAGNOSIS — M3500 Sicca syndrome, unspecified: Secondary | ICD-10-CM | POA: Diagnosis not present

## 2020-03-14 DIAGNOSIS — I10 Essential (primary) hypertension: Secondary | ICD-10-CM | POA: Diagnosis not present

## 2020-03-14 DIAGNOSIS — Z299 Encounter for prophylactic measures, unspecified: Secondary | ICD-10-CM | POA: Diagnosis not present

## 2020-03-14 DIAGNOSIS — F331 Major depressive disorder, recurrent, moderate: Secondary | ICD-10-CM

## 2020-03-14 DIAGNOSIS — C50919 Malignant neoplasm of unspecified site of unspecified female breast: Secondary | ICD-10-CM | POA: Diagnosis not present

## 2020-03-14 DIAGNOSIS — R413 Other amnesia: Secondary | ICD-10-CM | POA: Diagnosis not present

## 2020-03-14 MED ORDER — DULOXETINE HCL 60 MG PO CPEP: 60.0000 mg | ORAL_CAPSULE | Freq: Two times a day (BID) | ORAL | 2 refills | Status: DC

## 2020-03-14 MED ORDER — ALPRAZOLAM 0.5 MG PO TABS
ORAL_TABLET | ORAL | 2 refills | Status: DC
Start: 2020-03-14 — End: 2020-06-06

## 2020-03-14 MED ORDER — OLANZAPINE 5 MG PO TABS
5.0000 mg | ORAL_TABLET | Freq: Every day | ORAL | 2 refills | Status: DC
Start: 2020-03-14 — End: 2020-06-06

## 2020-03-14 MED ORDER — LAMOTRIGINE 100 MG PO TABS
100.0000 mg | ORAL_TABLET | Freq: Every day | ORAL | 2 refills | Status: DC
Start: 2020-03-14 — End: 2020-06-06

## 2020-03-14 NOTE — Progress Notes (Signed)
Virtual Visit via Telephone Note  I connected with Caitlin [name] on 03/14/20 at  9:20 AM EDT by telephone and verified that I am speaking with the correct person using two identifiers.   I discussed the limitations, risks, security and privacy concerns of performing an evaluation and management service by telephone and the availability of in person appointments. I also discussed with the patient that there may be a patient responsible charge related to this service. The patient expressed understanding and agreed to proceed.:    I discussed the assessment and treatment plan with the patient. The patient was provided an opportunity to ask questions and all were answered. The patient agreed with the plan and demonstrated an understanding of the instructions.   The patient was advised to call back or seek an in-person evaluation if the symptoms worsen or if the condition fails to improve as anticipated.  I provided 15 minutes of non-face-to-face time during this encounter. Patient: Provider Home, patient home  Caitlin Spiller, MD  [facility] MD/PA/NP OP Progress Note  03/14/2020 9:40 AM ESTEPHANY [name]  MRN:  [id number]  Chief Complaint:  Chief Complaint    Depression; Anxiety; Follow-up     HPI: Thispatient is a 69 year old married white female who lives with her husband in [place]. She has 4 children and 6 grandchildren. She worked in a [employer] until they let her go in 2005. She is self-referred.  The patient states that her depression started in the late 90s. She had to have an emergency colostomy due to diverticulosis. She had complications from the surgery and had to have a hernia repair and several other surgeries on her abdomen. She is left with a huge scar which lowered her self-confidence and self-esteem. She also suffers from migraine headaches.  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 Since then she's really gone downhill. She's become increasingly  depressed and anxious. She's been seen at [facility] since 2009 but feels like the most recent psychiatrist does not listen to her and changed all the medications that were helpful. Currently she has lots of headaches, she's tired all the time and doesn't leave the house. She feels like she is a burden to everyone around her and has passive suicidal ideation but no plan. She sleeps okay her appetite has diminished.  In 2011 she was admitted to [facility] because she was depressed and psychotic. She thinks this was a reaction to a medication that she got from migraine headache. She doesn't recall the name of the medicine. She was not hospitalized since then has had little therapy  The patient returns after 3 months.  Last time she seemed more depressed so we tried to add Effexor XR to her regimen.  Her husband states that it may her very confused her pupils were pinpricks and she was not responding well.  He stopped the medication.  She states that she has been doing "okay."  She does have times of feeling down but her husband thinks it is related to the death of their son about 3 years ago.  We discussed changing Zyprexa to Rexulti but when she heard we probably have to get it preapproved she declined and could not be talked into it.  She denies being suicidal.  She states part of the issue is that they cannot get out and do much during the coronavirus even though they are both vaccinated.  She is sleeping probably 2 months seems as  if she is somewhat bored.  At this point however she is unwilling to consider any medication changes. Visit Diagnosis:    ICD-10-CM   1. Major depressive disorder, recurrent episode, moderate (HCC)  F33.1     Past Psychiatric History: 1 psychiatric hospitalization in 2011  Past Medical History:  Past Medical History:  Diagnosis Date  . Anemia many yrs ago   not on any meds  . Anxiety    takes Xanax daily as needed  . Breast cancer ([place])   . Cataracts,  bilateral    immature  . Depression    takes Cymbalta daily  . Diverticulitis   . GERD (gastroesophageal reflux disease)    takes Pantoprazole daily as needed  . Headache(784.0)    takes Topamax nightly;last migraine has been a while  . History of blood transfusion    no abnormal reaction noted  . History of bronchitis around 2005  . History of kidney stones   . History of shingles   . Hyperlipidemia    takes Atorvastatin daily  . Hypertension    takes Losaratn daily  . Insomnia    takes Zyprexa nightly  . Pneumonia around 2005    Past Surgical History:  Procedure Laterality Date  . BREAST RECONSTRUCTION WITH PLACEMENT OF TISSUE EXPANDER AND FLEX HD (ACELLULAR HYDRATED DERMIS) Left 02/10/2014   Procedure: LEFT BREAST RECONSTRUCTION WITH PLACEMENT OF TISSUE EXPANDER AND  FLEX HD (ACELLULAR HYDRATED DERMIS);  Surgeon: Crissie Reese, MD;  Location: [facility];  Service: Plastics;  Laterality: Left;  . COLON SURGERY     d/t diverticulitis  . colostomy take down    . ESOPHAGOGASTRODUODENOSCOPY    . HERNIA REPAIR  2014  . INCISIONAL HERNIA REPAIR    . MASTECTOMY Left   . R kidney tumor removed    . SIMPLE MASTECTOMY WITH AXILLARY SENTINEL NODE BIOPSY Left 02/10/2014   Procedure: LEFT TOTAL MASTECTOMY WITH AXILLARY SENTINEL NODE BIOPSY;  Surgeon: Edward Jolly, MD;  Location: [facility];  Service: General;  Laterality: Left;  . TONSILLECTOMY    . TUBAL LIGATION      Family Psychiatric History: see below  Family History:  Family History  Problem Relation Age of Onset  . Depression Mother   . Alcohol abuse Father     Social History:  Social History   Socioeconomic History  . Marital status: Married    Spouse name: Not on file  . Number of children: Not on file  . Years of education: Not on file  . Highest education level: Not on file  Occupational History  . Not on file  Tobacco Use  . Smoking status: Former Research scientist (life sciences)  . Smokeless tobacco: Never Used  . Tobacco comment:  quit smoking almost 3 yrs ago  Vaping Use  . Vaping Use: Never used  Substance and Sexual Activity  . Alcohol use: No  . Drug use: No  . Sexual activity: Not Currently    Birth control/protection: Post-menopausal  Other Topics Concern  . Not on file  Social History Narrative  . Not on file   Social Determinants of Health   Financial Resource Strain:   . Difficulty of Paying Living Expenses: Not on file  Food Insecurity:   . Worried About Charity fundraiser in the Last Year: Not on file  . Ran Out of Food in the Last Year: Not on file  Transportation Needs:   . Lack of Transportation (Medical): Not on file  . Lack of Transportation (  Non-Medical): Not on file  Physical Activity:   . Days of Exercise per Week: Not on file  . Minutes of Exercise per Session: Not on file  Stress:   . Feeling of Stress : Not on file  Social Connections:   . Frequency of Communication with Friends and Family: Not on file  . Frequency of Social Gatherings with Friends and Family: Not on file  . Attends Religious Services: Not on file  . Active Member of Clubs or Organizations: Not on file  . Attends Archivist Meetings: Not on file  . Marital Status: Not on file    Allergies:  Allergies  Allergen Reactions  . Codeine     Nausea/vomiting    Metabolic Disorder Labs: No results found for: HGBA1C, MPG No results found for: PROLACTIN No results found for: CHOL, TRIG, HDL, CHOLHDL, VLDL, LDLCALC No results found for: TSH  Therapeutic Level Labs: No results found for: LITHIUM No results found for: VALPROATE No components found for:  CBMZ  Current Medications: Current Outpatient Medications  Medication Sig Dispense Refill  . alendronate (FOSAMAX) 70 MG tablet TAKE ONE TABLET BY MOUTH ONCE A WEEK IN THE MORNING WITH A FULL GLASS OF WATER ON AN EMPTY STOMACH. DO NOT LAY DOWN FOR 30 MINUTES.    Marland Kitchen ALPRAZolam (XANAX) 0.5 MG tablet TAKE ONE TABLET BY MOUTH DAILY AS NEEDED FOR ANXIETY  30 tablet 2  . atorvastatin (LIPITOR) 10 MG tablet Take 10 mg by mouth every morning.     . cetirizine (ZYRTEC) 10 MG tablet Take 10 mg by mouth at bedtime.  12  . DULoxetine (CYMBALTA) 60 MG capsule Take 1 capsule (60 mg total) by mouth 2 (two) times daily. 60 capsule 2  . exemestane (AROMASIN) 25 MG tablet Take 1 tablet (25 mg total) by mouth daily after breakfast. 90 tablet 4  . lamoTRIgine (LAMICTAL) 100 MG tablet Take 1 tablet (100 mg total) by mouth at bedtime. 30 tablet 2  . losartan (COZAAR) 50 MG tablet Take 50 mg by mouth every morning. Take 1/2 tablet 25 mg    . OLANZapine (ZYPREXA) 5 MG tablet Take 1 tablet (5 mg total) by mouth at bedtime. 30 tablet 2  . pantoprazole (PROTONIX) 40 MG tablet Take 40 mg by mouth daily as needed (for acid reflux).     . topiramate (TOPAMAX) 100 MG tablet Take 100 mg by mouth daily.     Marland Kitchen topiramate (TOPAMAX) 50 MG tablet Take 50 mg by mouth daily.    . vitamin B-12 (CYANOCOBALAMIN) 1000 MCG tablet Take 1,000 mcg by mouth daily.      No current facility-administered medications for this visit.     Musculoskeletal: Strength & Muscle Tone: within normal limits Gait & Station: normal Patient leans: N/A  Psychiatric Specialty Exam: Review of Systems  Constitutional: Positive for fatigue.  Psychiatric/Behavioral: Positive for dysphoric mood.  All other systems reviewed and are negative.   There were no vitals taken for this visit.There is no height or weight on file to calculate BMI.  General Appearance: NA  Eye Contact:  NA  Speech:  Clear and Coherent  Volume:  Normal  Mood:  Dysphoric  Affect:  NA  Thought Process:  Goal Directed  Orientation:  Full (Time, Place, and Person)  Thought Content: Rumination   Suicidal Thoughts:  No  Homicidal Thoughts:  No  Memory:  Immediate;   Good Recent;   Good Remote;   Fair  Judgement:  Fair  Insight:  Shallow  Psychomotor Activity:  Decreased  Concentration:  Concentration: Fair and Attention  Span: Fair  Recall:  Good  Fund of Knowledge: Good  Language: Good  Akathisia:  No  Handed:  Right  AIMS (if indicated): not done  Assets:  Communication Skills Desire for Improvement Resilience Social Support Talents/Skills  ADL's:  Intact  Cognition: WNL  Sleep:  Good   Screenings:   Assessment and Plan: This patient is a 69 year old female with a history of depression and anxiety.  She again seems to be more depressed but she is very unwilling to try anything new.  She is not suicidal and her husband thinks she is fairly stable at this point.  She will continue with Cymbalta 60 mg twice daily for depression, olanzapine 5 mg at bedtime for augmentation, Lamictal 100 mg at bedtime for mood stabilization and Xanax 0.5 mg daily as needed for anxiety and sleep.  She will return to see me in 3 months   Caitlin Spiller, MD 03/14/2020, 9:40 AM

## 2020-03-22 DIAGNOSIS — I10 Essential (primary) hypertension: Secondary | ICD-10-CM | POA: Diagnosis not present

## 2020-03-22 DIAGNOSIS — R413 Other amnesia: Secondary | ICD-10-CM | POA: Diagnosis not present

## 2020-03-22 DIAGNOSIS — F329 Major depressive disorder, single episode, unspecified: Secondary | ICD-10-CM | POA: Diagnosis not present

## 2020-03-22 DIAGNOSIS — E7800 Pure hypercholesterolemia, unspecified: Secondary | ICD-10-CM | POA: Diagnosis not present

## 2020-04-04 DIAGNOSIS — C50919 Malignant neoplasm of unspecified site of unspecified female breast: Secondary | ICD-10-CM | POA: Diagnosis not present

## 2020-04-04 DIAGNOSIS — I10 Essential (primary) hypertension: Secondary | ICD-10-CM | POA: Diagnosis not present

## 2020-04-04 DIAGNOSIS — Z299 Encounter for prophylactic measures, unspecified: Secondary | ICD-10-CM | POA: Diagnosis not present

## 2020-04-04 DIAGNOSIS — R413 Other amnesia: Secondary | ICD-10-CM | POA: Diagnosis not present

## 2020-04-04 DIAGNOSIS — M3500 Sicca syndrome, unspecified: Secondary | ICD-10-CM | POA: Diagnosis not present

## 2020-04-20 DIAGNOSIS — I10 Essential (primary) hypertension: Secondary | ICD-10-CM | POA: Diagnosis not present

## 2020-04-20 DIAGNOSIS — E7800 Pure hypercholesterolemia, unspecified: Secondary | ICD-10-CM | POA: Diagnosis not present

## 2020-04-20 DIAGNOSIS — F329 Major depressive disorder, single episode, unspecified: Secondary | ICD-10-CM | POA: Diagnosis not present

## 2020-05-07 DIAGNOSIS — H01002 Unspecified blepharitis right lower eyelid: Secondary | ICD-10-CM | POA: Diagnosis not present

## 2020-05-07 DIAGNOSIS — H02831 Dermatochalasis of right upper eyelid: Secondary | ICD-10-CM | POA: Diagnosis not present

## 2020-05-07 DIAGNOSIS — H01001 Unspecified blepharitis right upper eyelid: Secondary | ICD-10-CM | POA: Diagnosis not present

## 2020-05-07 DIAGNOSIS — H02834 Dermatochalasis of left upper eyelid: Secondary | ICD-10-CM | POA: Diagnosis not present

## 2020-05-07 DIAGNOSIS — H01004 Unspecified blepharitis left upper eyelid: Secondary | ICD-10-CM | POA: Diagnosis not present

## 2020-05-07 DIAGNOSIS — H2513 Age-related nuclear cataract, bilateral: Secondary | ICD-10-CM | POA: Diagnosis not present

## 2020-05-07 DIAGNOSIS — H01005 Unspecified blepharitis left lower eyelid: Secondary | ICD-10-CM | POA: Diagnosis not present

## 2020-05-14 DIAGNOSIS — R0600 Dyspnea, unspecified: Secondary | ICD-10-CM | POA: Diagnosis not present

## 2020-05-14 DIAGNOSIS — G459 Transient cerebral ischemic attack, unspecified: Secondary | ICD-10-CM | POA: Diagnosis not present

## 2020-05-14 DIAGNOSIS — J984 Other disorders of lung: Secondary | ICD-10-CM | POA: Diagnosis not present

## 2020-05-14 DIAGNOSIS — J439 Emphysema, unspecified: Secondary | ICD-10-CM | POA: Diagnosis not present

## 2020-05-14 DIAGNOSIS — I63311 Cerebral infarction due to thrombosis of right middle cerebral artery: Secondary | ICD-10-CM | POA: Diagnosis not present

## 2020-05-14 DIAGNOSIS — J15211 Pneumonia due to Methicillin susceptible Staphylococcus aureus: Secondary | ICD-10-CM | POA: Diagnosis not present

## 2020-05-14 DIAGNOSIS — Z09 Encounter for follow-up examination after completed treatment for conditions other than malignant neoplasm: Secondary | ICD-10-CM | POA: Diagnosis not present

## 2020-05-14 DIAGNOSIS — R410 Disorientation, unspecified: Secondary | ICD-10-CM | POA: Diagnosis not present

## 2020-05-14 DIAGNOSIS — I080 Rheumatic disorders of both mitral and aortic valves: Secondary | ICD-10-CM | POA: Diagnosis not present

## 2020-05-14 DIAGNOSIS — I82B21 Chronic embolism and thrombosis of right subclavian vein: Secondary | ICD-10-CM | POA: Diagnosis not present

## 2020-05-14 DIAGNOSIS — R0902 Hypoxemia: Secondary | ICD-10-CM | POA: Diagnosis not present

## 2020-05-14 DIAGNOSIS — I517 Cardiomegaly: Secondary | ICD-10-CM | POA: Diagnosis not present

## 2020-05-14 DIAGNOSIS — I6389 Other cerebral infarction: Secondary | ICD-10-CM | POA: Diagnosis not present

## 2020-05-14 DIAGNOSIS — Z7189 Other specified counseling: Secondary | ICD-10-CM | POA: Diagnosis not present

## 2020-05-14 DIAGNOSIS — R0602 Shortness of breath: Secondary | ICD-10-CM | POA: Diagnosis not present

## 2020-05-14 DIAGNOSIS — I63521 Cerebral infarction due to unspecified occlusion or stenosis of right anterior cerebral artery: Secondary | ICD-10-CM | POA: Diagnosis not present

## 2020-05-14 DIAGNOSIS — S99922A Unspecified injury of left foot, initial encounter: Secondary | ICD-10-CM | POA: Diagnosis not present

## 2020-05-14 DIAGNOSIS — R1310 Dysphagia, unspecified: Secondary | ICD-10-CM | POA: Diagnosis not present

## 2020-05-14 DIAGNOSIS — I959 Hypotension, unspecified: Secondary | ICD-10-CM | POA: Diagnosis not present

## 2020-05-14 DIAGNOSIS — F419 Anxiety disorder, unspecified: Secondary | ICD-10-CM | POA: Diagnosis not present

## 2020-05-14 DIAGNOSIS — J189 Pneumonia, unspecified organism: Secondary | ICD-10-CM | POA: Diagnosis not present

## 2020-05-14 DIAGNOSIS — Z743 Need for continuous supervision: Secondary | ICD-10-CM | POA: Diagnosis not present

## 2020-05-14 DIAGNOSIS — I63233 Cerebral infarction due to unspecified occlusion or stenosis of bilateral carotid arteries: Secondary | ICD-10-CM | POA: Diagnosis not present

## 2020-05-14 DIAGNOSIS — R0989 Other specified symptoms and signs involving the circulatory and respiratory systems: Secondary | ICD-10-CM | POA: Diagnosis not present

## 2020-05-14 DIAGNOSIS — I6782 Cerebral ischemia: Secondary | ICD-10-CM | POA: Diagnosis not present

## 2020-05-14 DIAGNOSIS — J9811 Atelectasis: Secondary | ICD-10-CM | POA: Diagnosis not present

## 2020-05-14 DIAGNOSIS — D649 Anemia, unspecified: Secondary | ICD-10-CM | POA: Diagnosis not present

## 2020-05-14 DIAGNOSIS — J449 Chronic obstructive pulmonary disease, unspecified: Secondary | ICD-10-CM | POA: Diagnosis not present

## 2020-05-14 DIAGNOSIS — I639 Cerebral infarction, unspecified: Secondary | ICD-10-CM | POA: Diagnosis not present

## 2020-05-14 DIAGNOSIS — F329 Major depressive disorder, single episode, unspecified: Secondary | ICD-10-CM | POA: Diagnosis not present

## 2020-05-14 DIAGNOSIS — Z9911 Dependence on respirator [ventilator] status: Secondary | ICD-10-CM | POA: Diagnosis not present

## 2020-05-14 DIAGNOSIS — I82401 Acute embolism and thrombosis of unspecified deep veins of right lower extremity: Secondary | ICD-10-CM | POA: Diagnosis not present

## 2020-05-14 DIAGNOSIS — J9602 Acute respiratory failure with hypercapnia: Secondary | ICD-10-CM | POA: Diagnosis not present

## 2020-05-14 DIAGNOSIS — R4182 Altered mental status, unspecified: Secondary | ICD-10-CM | POA: Diagnosis not present

## 2020-05-14 DIAGNOSIS — R29810 Facial weakness: Secondary | ICD-10-CM | POA: Diagnosis not present

## 2020-05-14 DIAGNOSIS — Z452 Encounter for adjustment and management of vascular access device: Secondary | ICD-10-CM | POA: Diagnosis not present

## 2020-05-14 DIAGNOSIS — G8194 Hemiplegia, unspecified affecting left nondominant side: Secondary | ICD-10-CM | POA: Diagnosis not present

## 2020-05-14 DIAGNOSIS — R000 Tachycardia, unspecified: Secondary | ICD-10-CM | POA: Diagnosis not present

## 2020-05-14 DIAGNOSIS — I10 Essential (primary) hypertension: Secondary | ICD-10-CM | POA: Diagnosis not present

## 2020-05-14 DIAGNOSIS — E7800 Pure hypercholesterolemia, unspecified: Secondary | ICD-10-CM | POA: Diagnosis not present

## 2020-05-14 DIAGNOSIS — R6521 Severe sepsis with septic shock: Secondary | ICD-10-CM | POA: Diagnosis not present

## 2020-05-14 DIAGNOSIS — I083 Combined rheumatic disorders of mitral, aortic and tricuspid valves: Secondary | ICD-10-CM | POA: Diagnosis not present

## 2020-05-14 DIAGNOSIS — Z4682 Encounter for fitting and adjustment of non-vascular catheter: Secondary | ICD-10-CM | POA: Diagnosis not present

## 2020-05-14 DIAGNOSIS — N183 Chronic kidney disease, stage 3 unspecified: Secondary | ICD-10-CM | POA: Diagnosis not present

## 2020-05-14 DIAGNOSIS — Z515 Encounter for palliative care: Secondary | ICD-10-CM | POA: Diagnosis not present

## 2020-05-14 DIAGNOSIS — J9690 Respiratory failure, unspecified, unspecified whether with hypoxia or hypercapnia: Secondary | ICD-10-CM | POA: Diagnosis not present

## 2020-05-14 DIAGNOSIS — R0603 Acute respiratory distress: Secondary | ICD-10-CM | POA: Diagnosis not present

## 2020-05-14 DIAGNOSIS — R279 Unspecified lack of coordination: Secondary | ICD-10-CM | POA: Diagnosis not present

## 2020-05-14 DIAGNOSIS — R9431 Abnormal electrocardiogram [ECG] [EKG]: Secondary | ICD-10-CM | POA: Diagnosis not present

## 2020-05-14 DIAGNOSIS — F32A Depression, unspecified: Secondary | ICD-10-CM | POA: Diagnosis not present

## 2020-05-14 DIAGNOSIS — R0901 Asphyxia: Secondary | ICD-10-CM | POA: Diagnosis not present

## 2020-05-14 DIAGNOSIS — I82611 Acute embolism and thrombosis of superficial veins of right upper extremity: Secondary | ICD-10-CM | POA: Diagnosis not present

## 2020-05-14 DIAGNOSIS — I509 Heart failure, unspecified: Secondary | ICD-10-CM | POA: Diagnosis not present

## 2020-05-14 DIAGNOSIS — G9340 Encephalopathy, unspecified: Secondary | ICD-10-CM | POA: Diagnosis not present

## 2020-05-14 DIAGNOSIS — N179 Acute kidney failure, unspecified: Secondary | ICD-10-CM | POA: Diagnosis not present

## 2020-05-14 DIAGNOSIS — J150 Pneumonia due to Klebsiella pneumoniae: Secondary | ICD-10-CM | POA: Diagnosis not present

## 2020-05-14 DIAGNOSIS — J9601 Acute respiratory failure with hypoxia: Secondary | ICD-10-CM | POA: Diagnosis not present

## 2020-05-14 DIAGNOSIS — R918 Other nonspecific abnormal finding of lung field: Secondary | ICD-10-CM | POA: Diagnosis not present

## 2020-05-14 DIAGNOSIS — I081 Rheumatic disorders of both mitral and tricuspid valves: Secondary | ICD-10-CM | POA: Diagnosis not present

## 2020-05-14 DIAGNOSIS — E870 Hyperosmolality and hypernatremia: Secondary | ICD-10-CM | POA: Diagnosis not present

## 2020-05-14 DIAGNOSIS — R4781 Slurred speech: Secondary | ICD-10-CM | POA: Diagnosis not present

## 2020-05-14 DIAGNOSIS — I82A21 Chronic embolism and thrombosis of right axillary vein: Secondary | ICD-10-CM | POA: Diagnosis not present

## 2020-05-14 DIAGNOSIS — I63511 Cerebral infarction due to unspecified occlusion or stenosis of right middle cerebral artery: Secondary | ICD-10-CM | POA: Diagnosis not present

## 2020-05-14 DIAGNOSIS — Z8673 Personal history of transient ischemic attack (TIA), and cerebral infarction without residual deficits: Secondary | ICD-10-CM | POA: Diagnosis not present

## 2020-05-14 DIAGNOSIS — A419 Sepsis, unspecified organism: Secondary | ICD-10-CM | POA: Diagnosis not present

## 2020-05-14 DIAGNOSIS — I700 Atherosclerosis of aorta: Secondary | ICD-10-CM | POA: Diagnosis not present

## 2020-05-14 DIAGNOSIS — Z20822 Contact with and (suspected) exposure to covid-19: Secondary | ICD-10-CM | POA: Diagnosis not present

## 2020-05-14 DIAGNOSIS — R579 Shock, unspecified: Secondary | ICD-10-CM | POA: Diagnosis not present

## 2020-05-14 DIAGNOSIS — R404 Transient alteration of awareness: Secondary | ICD-10-CM | POA: Diagnosis not present

## 2020-05-14 DIAGNOSIS — E785 Hyperlipidemia, unspecified: Secondary | ICD-10-CM | POA: Diagnosis not present

## 2020-05-14 DIAGNOSIS — F17200 Nicotine dependence, unspecified, uncomplicated: Secondary | ICD-10-CM | POA: Diagnosis not present

## 2020-05-14 DIAGNOSIS — Z66 Do not resuscitate: Secondary | ICD-10-CM | POA: Diagnosis not present

## 2020-05-15 DIAGNOSIS — F32A Depression, unspecified: Secondary | ICD-10-CM | POA: Diagnosis not present

## 2020-05-15 DIAGNOSIS — I63511 Cerebral infarction due to unspecified occlusion or stenosis of right middle cerebral artery: Secondary | ICD-10-CM | POA: Diagnosis not present

## 2020-05-15 DIAGNOSIS — R1310 Dysphagia, unspecified: Secondary | ICD-10-CM | POA: Diagnosis not present

## 2020-05-15 DIAGNOSIS — R4182 Altered mental status, unspecified: Secondary | ICD-10-CM | POA: Diagnosis not present

## 2020-05-15 DIAGNOSIS — F419 Anxiety disorder, unspecified: Secondary | ICD-10-CM | POA: Diagnosis not present

## 2020-05-16 ENCOUNTER — Telehealth (HOSPITAL_COMMUNITY): Payer: Self-pay | Admitting: Psychiatry

## 2020-05-16 DIAGNOSIS — I63511 Cerebral infarction due to unspecified occlusion or stenosis of right middle cerebral artery: Secondary | ICD-10-CM | POA: Diagnosis not present

## 2020-05-16 DIAGNOSIS — S99922A Unspecified injury of left foot, initial encounter: Secondary | ICD-10-CM | POA: Diagnosis not present

## 2020-05-16 DIAGNOSIS — F32A Depression, unspecified: Secondary | ICD-10-CM | POA: Diagnosis not present

## 2020-05-16 DIAGNOSIS — R0989 Other specified symptoms and signs involving the circulatory and respiratory systems: Secondary | ICD-10-CM | POA: Diagnosis not present

## 2020-05-16 DIAGNOSIS — F419 Anxiety disorder, unspecified: Secondary | ICD-10-CM | POA: Diagnosis not present

## 2020-05-16 DIAGNOSIS — J9602 Acute respiratory failure with hypercapnia: Secondary | ICD-10-CM | POA: Diagnosis not present

## 2020-05-16 DIAGNOSIS — Z4682 Encounter for fitting and adjustment of non-vascular catheter: Secondary | ICD-10-CM | POA: Diagnosis not present

## 2020-05-16 DIAGNOSIS — J9601 Acute respiratory failure with hypoxia: Secondary | ICD-10-CM | POA: Diagnosis not present

## 2020-05-16 DIAGNOSIS — R0603 Acute respiratory distress: Secondary | ICD-10-CM | POA: Diagnosis not present

## 2020-05-16 NOTE — Telephone Encounter (Signed)
Ok thanks 

## 2020-05-16 NOTE — Telephone Encounter (Signed)
Called patient to reschedule 12/27 appt due to office being closed for holiday. Husband of patient advised pt had a stroke on 11/22 (Monday) and is not able to speak or use left arm. Husband is unsure if pt will return home due to doctor advising may need to go to facility. Husband advised he would call back after the first week in December to give update and cancel appt if needed

## 2020-05-17 DIAGNOSIS — J9601 Acute respiratory failure with hypoxia: Secondary | ICD-10-CM | POA: Diagnosis not present

## 2020-05-17 DIAGNOSIS — Z9911 Dependence on respirator [ventilator] status: Secondary | ICD-10-CM | POA: Diagnosis not present

## 2020-05-17 DIAGNOSIS — R1310 Dysphagia, unspecified: Secondary | ICD-10-CM | POA: Diagnosis not present

## 2020-05-17 DIAGNOSIS — R579 Shock, unspecified: Secondary | ICD-10-CM | POA: Diagnosis not present

## 2020-05-17 DIAGNOSIS — I63521 Cerebral infarction due to unspecified occlusion or stenosis of right anterior cerebral artery: Secondary | ICD-10-CM | POA: Diagnosis not present

## 2020-05-17 DIAGNOSIS — J9811 Atelectasis: Secondary | ICD-10-CM | POA: Diagnosis not present

## 2020-05-18 DIAGNOSIS — R579 Shock, unspecified: Secondary | ICD-10-CM | POA: Diagnosis not present

## 2020-05-18 DIAGNOSIS — Z9911 Dependence on respirator [ventilator] status: Secondary | ICD-10-CM | POA: Diagnosis not present

## 2020-05-18 DIAGNOSIS — R918 Other nonspecific abnormal finding of lung field: Secondary | ICD-10-CM | POA: Diagnosis not present

## 2020-05-18 DIAGNOSIS — J9601 Acute respiratory failure with hypoxia: Secondary | ICD-10-CM | POA: Diagnosis not present

## 2020-05-18 DIAGNOSIS — Z452 Encounter for adjustment and management of vascular access device: Secondary | ICD-10-CM | POA: Diagnosis not present

## 2020-05-18 DIAGNOSIS — Z8673 Personal history of transient ischemic attack (TIA), and cerebral infarction without residual deficits: Secondary | ICD-10-CM | POA: Diagnosis not present

## 2020-05-18 DIAGNOSIS — R1310 Dysphagia, unspecified: Secondary | ICD-10-CM | POA: Diagnosis not present

## 2020-05-18 DIAGNOSIS — Z09 Encounter for follow-up examination after completed treatment for conditions other than malignant neoplasm: Secondary | ICD-10-CM | POA: Diagnosis not present

## 2020-05-18 DIAGNOSIS — I63521 Cerebral infarction due to unspecified occlusion or stenosis of right anterior cerebral artery: Secondary | ICD-10-CM | POA: Diagnosis not present

## 2020-05-19 DIAGNOSIS — J9601 Acute respiratory failure with hypoxia: Secondary | ICD-10-CM | POA: Diagnosis not present

## 2020-05-19 DIAGNOSIS — I63521 Cerebral infarction due to unspecified occlusion or stenosis of right anterior cerebral artery: Secondary | ICD-10-CM | POA: Diagnosis not present

## 2020-05-19 DIAGNOSIS — Z9911 Dependence on respirator [ventilator] status: Secondary | ICD-10-CM | POA: Diagnosis not present

## 2020-05-19 DIAGNOSIS — R0989 Other specified symptoms and signs involving the circulatory and respiratory systems: Secondary | ICD-10-CM | POA: Diagnosis not present

## 2020-05-19 DIAGNOSIS — R1310 Dysphagia, unspecified: Secondary | ICD-10-CM | POA: Diagnosis not present

## 2020-05-20 DIAGNOSIS — I63521 Cerebral infarction due to unspecified occlusion or stenosis of right anterior cerebral artery: Secondary | ICD-10-CM | POA: Diagnosis not present

## 2020-05-20 DIAGNOSIS — J984 Other disorders of lung: Secondary | ICD-10-CM | POA: Diagnosis not present

## 2020-05-20 DIAGNOSIS — R0989 Other specified symptoms and signs involving the circulatory and respiratory systems: Secondary | ICD-10-CM | POA: Diagnosis not present

## 2020-05-20 DIAGNOSIS — R1310 Dysphagia, unspecified: Secondary | ICD-10-CM | POA: Diagnosis not present

## 2020-05-20 DIAGNOSIS — Z4682 Encounter for fitting and adjustment of non-vascular catheter: Secondary | ICD-10-CM | POA: Diagnosis not present

## 2020-05-20 DIAGNOSIS — J15211 Pneumonia due to Methicillin susceptible Staphylococcus aureus: Secondary | ICD-10-CM | POA: Diagnosis not present

## 2020-05-20 DIAGNOSIS — Z9911 Dependence on respirator [ventilator] status: Secondary | ICD-10-CM | POA: Diagnosis not present

## 2020-05-20 DIAGNOSIS — J9601 Acute respiratory failure with hypoxia: Secondary | ICD-10-CM | POA: Diagnosis not present

## 2020-05-20 DIAGNOSIS — J150 Pneumonia due to Klebsiella pneumoniae: Secondary | ICD-10-CM | POA: Diagnosis not present

## 2020-05-21 DIAGNOSIS — I63521 Cerebral infarction due to unspecified occlusion or stenosis of right anterior cerebral artery: Secondary | ICD-10-CM | POA: Diagnosis not present

## 2020-05-21 DIAGNOSIS — Z9911 Dependence on respirator [ventilator] status: Secondary | ICD-10-CM | POA: Diagnosis not present

## 2020-05-21 DIAGNOSIS — R918 Other nonspecific abnormal finding of lung field: Secondary | ICD-10-CM | POA: Diagnosis not present

## 2020-05-22 DIAGNOSIS — J15211 Pneumonia due to Methicillin susceptible Staphylococcus aureus: Secondary | ICD-10-CM | POA: Diagnosis not present

## 2020-05-22 DIAGNOSIS — J9601 Acute respiratory failure with hypoxia: Secondary | ICD-10-CM | POA: Diagnosis not present

## 2020-05-22 DIAGNOSIS — Z452 Encounter for adjustment and management of vascular access device: Secondary | ICD-10-CM | POA: Diagnosis not present

## 2020-05-22 DIAGNOSIS — E785 Hyperlipidemia, unspecified: Secondary | ICD-10-CM | POA: Diagnosis not present

## 2020-05-22 DIAGNOSIS — I63511 Cerebral infarction due to unspecified occlusion or stenosis of right middle cerebral artery: Secondary | ICD-10-CM | POA: Diagnosis not present

## 2020-05-22 DIAGNOSIS — F419 Anxiety disorder, unspecified: Secondary | ICD-10-CM | POA: Diagnosis not present

## 2020-05-22 DIAGNOSIS — I10 Essential (primary) hypertension: Secondary | ICD-10-CM | POA: Diagnosis not present

## 2020-05-22 DIAGNOSIS — F329 Major depressive disorder, single episode, unspecified: Secondary | ICD-10-CM | POA: Diagnosis not present

## 2020-05-22 DIAGNOSIS — N179 Acute kidney failure, unspecified: Secondary | ICD-10-CM | POA: Diagnosis not present

## 2020-05-22 DIAGNOSIS — F32A Depression, unspecified: Secondary | ICD-10-CM | POA: Diagnosis not present

## 2020-05-22 DIAGNOSIS — R1310 Dysphagia, unspecified: Secondary | ICD-10-CM | POA: Diagnosis not present

## 2020-05-22 DIAGNOSIS — R918 Other nonspecific abnormal finding of lung field: Secondary | ICD-10-CM | POA: Diagnosis not present

## 2020-05-22 DIAGNOSIS — Z9911 Dependence on respirator [ventilator] status: Secondary | ICD-10-CM | POA: Diagnosis not present

## 2020-05-22 DIAGNOSIS — E7800 Pure hypercholesterolemia, unspecified: Secondary | ICD-10-CM | POA: Diagnosis not present

## 2020-05-22 DIAGNOSIS — J150 Pneumonia due to Klebsiella pneumoniae: Secondary | ICD-10-CM | POA: Diagnosis not present

## 2020-05-23 DIAGNOSIS — R1310 Dysphagia, unspecified: Secondary | ICD-10-CM | POA: Diagnosis not present

## 2020-05-23 DIAGNOSIS — R0602 Shortness of breath: Secondary | ICD-10-CM | POA: Diagnosis not present

## 2020-05-23 DIAGNOSIS — F419 Anxiety disorder, unspecified: Secondary | ICD-10-CM | POA: Diagnosis not present

## 2020-05-23 DIAGNOSIS — E870 Hyperosmolality and hypernatremia: Secondary | ICD-10-CM | POA: Diagnosis not present

## 2020-05-23 DIAGNOSIS — F32A Depression, unspecified: Secondary | ICD-10-CM | POA: Diagnosis not present

## 2020-05-23 DIAGNOSIS — J15211 Pneumonia due to Methicillin susceptible Staphylococcus aureus: Secondary | ICD-10-CM | POA: Diagnosis not present

## 2020-05-23 DIAGNOSIS — J150 Pneumonia due to Klebsiella pneumoniae: Secondary | ICD-10-CM | POA: Diagnosis not present

## 2020-05-23 DIAGNOSIS — D649 Anemia, unspecified: Secondary | ICD-10-CM | POA: Diagnosis not present

## 2020-05-23 DIAGNOSIS — J9601 Acute respiratory failure with hypoxia: Secondary | ICD-10-CM | POA: Diagnosis not present

## 2020-05-23 DIAGNOSIS — I63521 Cerebral infarction due to unspecified occlusion or stenosis of right anterior cerebral artery: Secondary | ICD-10-CM | POA: Diagnosis not present

## 2020-05-24 DIAGNOSIS — E870 Hyperosmolality and hypernatremia: Secondary | ICD-10-CM | POA: Diagnosis not present

## 2020-05-24 DIAGNOSIS — J9601 Acute respiratory failure with hypoxia: Secondary | ICD-10-CM | POA: Diagnosis not present

## 2020-05-24 DIAGNOSIS — R1310 Dysphagia, unspecified: Secondary | ICD-10-CM | POA: Diagnosis not present

## 2020-05-24 DIAGNOSIS — I63521 Cerebral infarction due to unspecified occlusion or stenosis of right anterior cerebral artery: Secondary | ICD-10-CM | POA: Diagnosis not present

## 2020-05-24 DIAGNOSIS — D649 Anemia, unspecified: Secondary | ICD-10-CM | POA: Diagnosis not present

## 2020-05-24 DIAGNOSIS — J150 Pneumonia due to Klebsiella pneumoniae: Secondary | ICD-10-CM | POA: Diagnosis not present

## 2020-05-24 DIAGNOSIS — J15211 Pneumonia due to Methicillin susceptible Staphylococcus aureus: Secondary | ICD-10-CM | POA: Diagnosis not present

## 2020-05-24 DIAGNOSIS — Z9911 Dependence on respirator [ventilator] status: Secondary | ICD-10-CM | POA: Diagnosis not present

## 2020-05-24 DIAGNOSIS — J439 Emphysema, unspecified: Secondary | ICD-10-CM | POA: Diagnosis not present

## 2020-05-24 DIAGNOSIS — F32A Depression, unspecified: Secondary | ICD-10-CM | POA: Diagnosis not present

## 2020-05-24 DIAGNOSIS — F419 Anxiety disorder, unspecified: Secondary | ICD-10-CM | POA: Diagnosis not present

## 2020-05-25 DIAGNOSIS — I10 Essential (primary) hypertension: Secondary | ICD-10-CM | POA: Diagnosis not present

## 2020-05-25 DIAGNOSIS — Z9911 Dependence on respirator [ventilator] status: Secondary | ICD-10-CM | POA: Diagnosis not present

## 2020-05-25 DIAGNOSIS — J449 Chronic obstructive pulmonary disease, unspecified: Secondary | ICD-10-CM | POA: Diagnosis not present

## 2020-05-25 DIAGNOSIS — I63511 Cerebral infarction due to unspecified occlusion or stenosis of right middle cerebral artery: Secondary | ICD-10-CM | POA: Diagnosis not present

## 2020-05-25 DIAGNOSIS — J9601 Acute respiratory failure with hypoxia: Secondary | ICD-10-CM | POA: Diagnosis not present

## 2020-05-25 DIAGNOSIS — N183 Chronic kidney disease, stage 3 unspecified: Secondary | ICD-10-CM | POA: Diagnosis not present

## 2020-05-25 DIAGNOSIS — I639 Cerebral infarction, unspecified: Secondary | ICD-10-CM | POA: Diagnosis not present

## 2020-05-25 DIAGNOSIS — E785 Hyperlipidemia, unspecified: Secondary | ICD-10-CM | POA: Diagnosis not present

## 2020-05-25 DIAGNOSIS — N179 Acute kidney failure, unspecified: Secondary | ICD-10-CM | POA: Diagnosis not present

## 2020-05-25 DIAGNOSIS — I083 Combined rheumatic disorders of mitral, aortic and tricuspid valves: Secondary | ICD-10-CM | POA: Diagnosis not present

## 2020-05-25 DIAGNOSIS — R1310 Dysphagia, unspecified: Secondary | ICD-10-CM | POA: Diagnosis not present

## 2020-05-25 DIAGNOSIS — F419 Anxiety disorder, unspecified: Secondary | ICD-10-CM | POA: Diagnosis not present

## 2020-05-25 DIAGNOSIS — J150 Pneumonia due to Klebsiella pneumoniae: Secondary | ICD-10-CM | POA: Diagnosis not present

## 2020-05-25 DIAGNOSIS — I080 Rheumatic disorders of both mitral and aortic valves: Secondary | ICD-10-CM | POA: Diagnosis not present

## 2020-05-25 DIAGNOSIS — F32A Depression, unspecified: Secondary | ICD-10-CM | POA: Diagnosis not present

## 2020-05-25 DIAGNOSIS — I6389 Other cerebral infarction: Secondary | ICD-10-CM | POA: Diagnosis not present

## 2020-05-25 DIAGNOSIS — J15211 Pneumonia due to Methicillin susceptible Staphylococcus aureus: Secondary | ICD-10-CM | POA: Diagnosis not present

## 2020-05-25 DIAGNOSIS — I63521 Cerebral infarction due to unspecified occlusion or stenosis of right anterior cerebral artery: Secondary | ICD-10-CM | POA: Diagnosis not present

## 2020-05-25 DIAGNOSIS — I517 Cardiomegaly: Secondary | ICD-10-CM | POA: Diagnosis not present

## 2020-05-25 DIAGNOSIS — I509 Heart failure, unspecified: Secondary | ICD-10-CM | POA: Diagnosis not present

## 2020-05-25 DIAGNOSIS — F17200 Nicotine dependence, unspecified, uncomplicated: Secondary | ICD-10-CM | POA: Diagnosis not present

## 2020-05-26 DIAGNOSIS — N179 Acute kidney failure, unspecified: Secondary | ICD-10-CM | POA: Diagnosis not present

## 2020-05-26 DIAGNOSIS — I63511 Cerebral infarction due to unspecified occlusion or stenosis of right middle cerebral artery: Secondary | ICD-10-CM | POA: Diagnosis not present

## 2020-05-26 DIAGNOSIS — R0602 Shortness of breath: Secondary | ICD-10-CM | POA: Diagnosis not present

## 2020-05-26 DIAGNOSIS — R1310 Dysphagia, unspecified: Secondary | ICD-10-CM | POA: Diagnosis not present

## 2020-05-26 DIAGNOSIS — R0989 Other specified symptoms and signs involving the circulatory and respiratory systems: Secondary | ICD-10-CM | POA: Diagnosis not present

## 2020-05-26 DIAGNOSIS — F32A Depression, unspecified: Secondary | ICD-10-CM | POA: Diagnosis not present

## 2020-05-26 DIAGNOSIS — J9601 Acute respiratory failure with hypoxia: Secondary | ICD-10-CM | POA: Diagnosis not present

## 2020-05-26 DIAGNOSIS — J150 Pneumonia due to Klebsiella pneumoniae: Secondary | ICD-10-CM | POA: Diagnosis not present

## 2020-05-26 DIAGNOSIS — J15211 Pneumonia due to Methicillin susceptible Staphylococcus aureus: Secondary | ICD-10-CM | POA: Diagnosis not present

## 2020-05-26 DIAGNOSIS — F419 Anxiety disorder, unspecified: Secondary | ICD-10-CM | POA: Diagnosis not present

## 2020-05-26 DIAGNOSIS — E785 Hyperlipidemia, unspecified: Secondary | ICD-10-CM | POA: Diagnosis not present

## 2020-05-27 DIAGNOSIS — Z9911 Dependence on respirator [ventilator] status: Secondary | ICD-10-CM | POA: Diagnosis not present

## 2020-05-27 DIAGNOSIS — I63521 Cerebral infarction due to unspecified occlusion or stenosis of right anterior cerebral artery: Secondary | ICD-10-CM | POA: Diagnosis not present

## 2020-05-28 DIAGNOSIS — R918 Other nonspecific abnormal finding of lung field: Secondary | ICD-10-CM | POA: Diagnosis not present

## 2020-05-28 DIAGNOSIS — Z9911 Dependence on respirator [ventilator] status: Secondary | ICD-10-CM | POA: Diagnosis not present

## 2020-05-28 DIAGNOSIS — I63521 Cerebral infarction due to unspecified occlusion or stenosis of right anterior cerebral artery: Secondary | ICD-10-CM | POA: Diagnosis not present

## 2020-05-29 DIAGNOSIS — Z452 Encounter for adjustment and management of vascular access device: Secondary | ICD-10-CM | POA: Diagnosis not present

## 2020-05-29 DIAGNOSIS — I639 Cerebral infarction, unspecified: Secondary | ICD-10-CM | POA: Diagnosis not present

## 2020-05-29 DIAGNOSIS — J984 Other disorders of lung: Secondary | ICD-10-CM | POA: Diagnosis not present

## 2020-05-29 DIAGNOSIS — J9690 Respiratory failure, unspecified, unspecified whether with hypoxia or hypercapnia: Secondary | ICD-10-CM | POA: Diagnosis not present

## 2020-05-30 DIAGNOSIS — I63521 Cerebral infarction due to unspecified occlusion or stenosis of right anterior cerebral artery: Secondary | ICD-10-CM | POA: Diagnosis not present

## 2020-05-30 DIAGNOSIS — R579 Shock, unspecified: Secondary | ICD-10-CM | POA: Diagnosis not present

## 2020-05-30 DIAGNOSIS — Z4682 Encounter for fitting and adjustment of non-vascular catheter: Secondary | ICD-10-CM | POA: Diagnosis not present

## 2020-05-30 DIAGNOSIS — J9601 Acute respiratory failure with hypoxia: Secondary | ICD-10-CM | POA: Diagnosis not present

## 2020-05-30 DIAGNOSIS — Z9911 Dependence on respirator [ventilator] status: Secondary | ICD-10-CM | POA: Diagnosis not present

## 2020-05-30 DIAGNOSIS — R1310 Dysphagia, unspecified: Secondary | ICD-10-CM | POA: Diagnosis not present

## 2020-05-30 DIAGNOSIS — R918 Other nonspecific abnormal finding of lung field: Secondary | ICD-10-CM | POA: Diagnosis not present

## 2020-05-30 DIAGNOSIS — I63511 Cerebral infarction due to unspecified occlusion or stenosis of right middle cerebral artery: Secondary | ICD-10-CM | POA: Diagnosis not present

## 2020-05-31 DIAGNOSIS — I63521 Cerebral infarction due to unspecified occlusion or stenosis of right anterior cerebral artery: Secondary | ICD-10-CM | POA: Diagnosis not present

## 2020-05-31 DIAGNOSIS — R1310 Dysphagia, unspecified: Secondary | ICD-10-CM | POA: Diagnosis not present

## 2020-05-31 DIAGNOSIS — R579 Shock, unspecified: Secondary | ICD-10-CM | POA: Diagnosis not present

## 2020-05-31 DIAGNOSIS — Z4682 Encounter for fitting and adjustment of non-vascular catheter: Secondary | ICD-10-CM | POA: Diagnosis not present

## 2020-05-31 DIAGNOSIS — Z9911 Dependence on respirator [ventilator] status: Secondary | ICD-10-CM | POA: Diagnosis not present

## 2020-05-31 DIAGNOSIS — J9601 Acute respiratory failure with hypoxia: Secondary | ICD-10-CM | POA: Diagnosis not present

## 2020-06-01 DIAGNOSIS — J189 Pneumonia, unspecified organism: Secondary | ICD-10-CM | POA: Diagnosis not present

## 2020-06-01 DIAGNOSIS — I63521 Cerebral infarction due to unspecified occlusion or stenosis of right anterior cerebral artery: Secondary | ICD-10-CM | POA: Diagnosis not present

## 2020-06-01 DIAGNOSIS — J9690 Respiratory failure, unspecified, unspecified whether with hypoxia or hypercapnia: Secondary | ICD-10-CM | POA: Diagnosis not present

## 2020-06-01 DIAGNOSIS — R1310 Dysphagia, unspecified: Secondary | ICD-10-CM | POA: Diagnosis not present

## 2020-06-01 DIAGNOSIS — J9601 Acute respiratory failure with hypoxia: Secondary | ICD-10-CM | POA: Diagnosis not present

## 2020-06-02 DIAGNOSIS — G8194 Hemiplegia, unspecified affecting left nondominant side: Secondary | ICD-10-CM | POA: Diagnosis not present

## 2020-06-02 DIAGNOSIS — G9340 Encephalopathy, unspecified: Secondary | ICD-10-CM | POA: Diagnosis not present

## 2020-06-02 DIAGNOSIS — I63521 Cerebral infarction due to unspecified occlusion or stenosis of right anterior cerebral artery: Secondary | ICD-10-CM | POA: Diagnosis not present

## 2020-06-03 DIAGNOSIS — J9690 Respiratory failure, unspecified, unspecified whether with hypoxia or hypercapnia: Secondary | ICD-10-CM | POA: Diagnosis not present

## 2020-06-03 DIAGNOSIS — G9340 Encephalopathy, unspecified: Secondary | ICD-10-CM | POA: Diagnosis not present

## 2020-06-03 DIAGNOSIS — I63521 Cerebral infarction due to unspecified occlusion or stenosis of right anterior cerebral artery: Secondary | ICD-10-CM | POA: Diagnosis not present

## 2020-06-03 DIAGNOSIS — G8194 Hemiplegia, unspecified affecting left nondominant side: Secondary | ICD-10-CM | POA: Diagnosis not present

## 2020-06-03 DIAGNOSIS — R918 Other nonspecific abnormal finding of lung field: Secondary | ICD-10-CM | POA: Diagnosis not present

## 2020-06-03 DIAGNOSIS — Z4682 Encounter for fitting and adjustment of non-vascular catheter: Secondary | ICD-10-CM | POA: Diagnosis not present

## 2020-06-04 DIAGNOSIS — I82401 Acute embolism and thrombosis of unspecified deep veins of right lower extremity: Secondary | ICD-10-CM | POA: Diagnosis not present

## 2020-06-04 DIAGNOSIS — R918 Other nonspecific abnormal finding of lung field: Secondary | ICD-10-CM | POA: Diagnosis not present

## 2020-06-04 DIAGNOSIS — I82B21 Chronic embolism and thrombosis of right subclavian vein: Secondary | ICD-10-CM | POA: Diagnosis not present

## 2020-06-04 DIAGNOSIS — I82A21 Chronic embolism and thrombosis of right axillary vein: Secondary | ICD-10-CM | POA: Diagnosis not present

## 2020-06-04 DIAGNOSIS — J9690 Respiratory failure, unspecified, unspecified whether with hypoxia or hypercapnia: Secondary | ICD-10-CM | POA: Diagnosis not present

## 2020-06-04 DIAGNOSIS — G9340 Encephalopathy, unspecified: Secondary | ICD-10-CM | POA: Diagnosis not present

## 2020-06-04 DIAGNOSIS — Z4682 Encounter for fitting and adjustment of non-vascular catheter: Secondary | ICD-10-CM | POA: Diagnosis not present

## 2020-06-04 DIAGNOSIS — I82611 Acute embolism and thrombosis of superficial veins of right upper extremity: Secondary | ICD-10-CM | POA: Diagnosis not present

## 2020-06-05 DIAGNOSIS — Z7189 Other specified counseling: Secondary | ICD-10-CM | POA: Diagnosis not present

## 2020-06-05 DIAGNOSIS — G9340 Encephalopathy, unspecified: Secondary | ICD-10-CM | POA: Diagnosis not present

## 2020-06-05 DIAGNOSIS — J9601 Acute respiratory failure with hypoxia: Secondary | ICD-10-CM | POA: Diagnosis not present

## 2020-06-05 DIAGNOSIS — Z515 Encounter for palliative care: Secondary | ICD-10-CM | POA: Diagnosis not present

## 2020-06-05 DIAGNOSIS — Z4682 Encounter for fitting and adjustment of non-vascular catheter: Secondary | ICD-10-CM | POA: Diagnosis not present

## 2020-06-05 DIAGNOSIS — R1310 Dysphagia, unspecified: Secondary | ICD-10-CM | POA: Diagnosis not present

## 2020-06-05 DIAGNOSIS — I63511 Cerebral infarction due to unspecified occlusion or stenosis of right middle cerebral artery: Secondary | ICD-10-CM | POA: Diagnosis not present

## 2020-06-06 ENCOUNTER — Other Ambulatory Visit (HOSPITAL_COMMUNITY): Payer: Self-pay | Admitting: Psychiatry

## 2020-06-06 DIAGNOSIS — I63511 Cerebral infarction due to unspecified occlusion or stenosis of right middle cerebral artery: Secondary | ICD-10-CM | POA: Diagnosis not present

## 2020-06-06 DIAGNOSIS — J9690 Respiratory failure, unspecified, unspecified whether with hypoxia or hypercapnia: Secondary | ICD-10-CM | POA: Diagnosis not present

## 2020-06-06 DIAGNOSIS — R918 Other nonspecific abnormal finding of lung field: Secondary | ICD-10-CM | POA: Diagnosis not present

## 2020-06-06 DIAGNOSIS — Z515 Encounter for palliative care: Secondary | ICD-10-CM | POA: Diagnosis not present

## 2020-06-06 DIAGNOSIS — Z4682 Encounter for fitting and adjustment of non-vascular catheter: Secondary | ICD-10-CM | POA: Diagnosis not present

## 2020-06-06 DIAGNOSIS — J9601 Acute respiratory failure with hypoxia: Secondary | ICD-10-CM | POA: Diagnosis not present

## 2020-06-06 DIAGNOSIS — R1310 Dysphagia, unspecified: Secondary | ICD-10-CM | POA: Diagnosis not present

## 2020-06-06 DIAGNOSIS — R0901 Asphyxia: Secondary | ICD-10-CM | POA: Diagnosis not present

## 2020-06-06 DIAGNOSIS — Z7189 Other specified counseling: Secondary | ICD-10-CM | POA: Diagnosis not present

## 2020-06-07 DIAGNOSIS — J9601 Acute respiratory failure with hypoxia: Secondary | ICD-10-CM | POA: Diagnosis not present

## 2020-06-07 DIAGNOSIS — J9690 Respiratory failure, unspecified, unspecified whether with hypoxia or hypercapnia: Secondary | ICD-10-CM | POA: Diagnosis not present

## 2020-06-07 DIAGNOSIS — R1310 Dysphagia, unspecified: Secondary | ICD-10-CM | POA: Diagnosis not present

## 2020-06-07 DIAGNOSIS — R0600 Dyspnea, unspecified: Secondary | ICD-10-CM | POA: Diagnosis not present

## 2020-06-07 DIAGNOSIS — Z4682 Encounter for fitting and adjustment of non-vascular catheter: Secondary | ICD-10-CM | POA: Diagnosis not present

## 2020-06-07 DIAGNOSIS — I639 Cerebral infarction, unspecified: Secondary | ICD-10-CM | POA: Diagnosis not present

## 2020-06-07 DIAGNOSIS — F32A Depression, unspecified: Secondary | ICD-10-CM | POA: Diagnosis not present

## 2020-06-07 DIAGNOSIS — I6782 Cerebral ischemia: Secondary | ICD-10-CM | POA: Diagnosis not present

## 2020-06-07 DIAGNOSIS — F419 Anxiety disorder, unspecified: Secondary | ICD-10-CM | POA: Diagnosis not present

## 2020-06-07 DIAGNOSIS — Z515 Encounter for palliative care: Secondary | ICD-10-CM | POA: Diagnosis not present

## 2020-06-07 DIAGNOSIS — I63521 Cerebral infarction due to unspecified occlusion or stenosis of right anterior cerebral artery: Secondary | ICD-10-CM | POA: Diagnosis not present

## 2020-06-07 DIAGNOSIS — Z7189 Other specified counseling: Secondary | ICD-10-CM | POA: Diagnosis not present

## 2020-06-08 DIAGNOSIS — F32A Depression, unspecified: Secondary | ICD-10-CM | POA: Diagnosis not present

## 2020-06-08 DIAGNOSIS — R1310 Dysphagia, unspecified: Secondary | ICD-10-CM | POA: Diagnosis not present

## 2020-06-08 DIAGNOSIS — Z515 Encounter for palliative care: Secondary | ICD-10-CM | POA: Diagnosis not present

## 2020-06-08 DIAGNOSIS — F419 Anxiety disorder, unspecified: Secondary | ICD-10-CM | POA: Diagnosis not present

## 2020-06-08 DIAGNOSIS — R0600 Dyspnea, unspecified: Secondary | ICD-10-CM | POA: Diagnosis not present

## 2020-06-08 DIAGNOSIS — I63521 Cerebral infarction due to unspecified occlusion or stenosis of right anterior cerebral artery: Secondary | ICD-10-CM | POA: Diagnosis not present

## 2020-06-08 DIAGNOSIS — Z452 Encounter for adjustment and management of vascular access device: Secondary | ICD-10-CM | POA: Diagnosis not present

## 2020-06-08 DIAGNOSIS — I517 Cardiomegaly: Secondary | ICD-10-CM | POA: Diagnosis not present

## 2020-06-08 DIAGNOSIS — J9601 Acute respiratory failure with hypoxia: Secondary | ICD-10-CM | POA: Diagnosis not present

## 2020-06-08 DIAGNOSIS — Z7189 Other specified counseling: Secondary | ICD-10-CM | POA: Diagnosis not present

## 2020-06-09 DIAGNOSIS — F32A Depression, unspecified: Secondary | ICD-10-CM | POA: Diagnosis not present

## 2020-06-09 DIAGNOSIS — R1310 Dysphagia, unspecified: Secondary | ICD-10-CM | POA: Diagnosis not present

## 2020-06-09 DIAGNOSIS — Z4682 Encounter for fitting and adjustment of non-vascular catheter: Secondary | ICD-10-CM | POA: Diagnosis not present

## 2020-06-09 DIAGNOSIS — Z7189 Other specified counseling: Secondary | ICD-10-CM | POA: Diagnosis not present

## 2020-06-09 DIAGNOSIS — Z515 Encounter for palliative care: Secondary | ICD-10-CM | POA: Diagnosis not present

## 2020-06-09 DIAGNOSIS — F419 Anxiety disorder, unspecified: Secondary | ICD-10-CM | POA: Diagnosis not present

## 2020-06-09 DIAGNOSIS — R0600 Dyspnea, unspecified: Secondary | ICD-10-CM | POA: Diagnosis not present

## 2020-06-09 DIAGNOSIS — I63521 Cerebral infarction due to unspecified occlusion or stenosis of right anterior cerebral artery: Secondary | ICD-10-CM | POA: Diagnosis not present

## 2020-06-09 DIAGNOSIS — J9601 Acute respiratory failure with hypoxia: Secondary | ICD-10-CM | POA: Diagnosis not present

## 2020-06-11 ENCOUNTER — Other Ambulatory Visit: Payer: Self-pay

## 2020-06-11 NOTE — Patient Outreach (Signed)
[facility]) Care Management  06/11/2020  Caitlin [name] [birth year] [id number]     Transition of Care Referral  Referral Date: 06/11/2020 Referral Source: [facility] Discharge Report Date of Discharge: 06/09/2020 Facility: [facility] Insurance: [facility] Medicare    Referral received. Per discharge summary patient discharged to hospice home. [facility] does not follow hospice patients.    Plan: RN CM will close case at this time.    Enzo Montgomery, RN,BSN,CCM [facility] Management Telephonic Care Management Coordinator Direct Phone: [phone] Toll Free: [phone] Fax: [phone]

## 2020-06-12 ENCOUNTER — Telehealth (HOSPITAL_COMMUNITY): Payer: Self-pay | Admitting: *Deleted

## 2020-06-12 NOTE — Telephone Encounter (Signed)
Patient husband called stating that patient passed away on 07/07/23 from a stroke. Per pt husband, he just want to inform Dr. Harrington Challenger and to thank her for her services in helping his wife while she was here.

## 2020-06-18 ENCOUNTER — Telehealth (HOSPITAL_COMMUNITY): Payer: Medicare HMO | Admitting: Psychiatry

## 2020-06-19 ENCOUNTER — Telehealth (HOSPITAL_COMMUNITY): Payer: Medicare HMO | Admitting: Psychiatry

## 2020-06-19 ENCOUNTER — Other Ambulatory Visit: Payer: Self-pay

## 2020-06-23 DEATH — deceased

## 2020-07-10 ENCOUNTER — Other Ambulatory Visit (HOSPITAL_COMMUNITY): Payer: Medicare HMO

## 2020-07-11 ENCOUNTER — Other Ambulatory Visit (HOSPITAL_COMMUNITY): Payer: Medicare HMO

## 2020-07-13 ENCOUNTER — Ambulatory Visit: Admit: 2020-07-13 | Payer: Medicare HMO | Admitting: Ophthalmology

## 2020-07-13 SURGERY — PHACOEMULSIFICATION, CATARACT, WITH IOL INSERTION
Anesthesia: Monitor Anesthesia Care | Laterality: Left

## 2021-01-03 ENCOUNTER — Other Ambulatory Visit (HOSPITAL_COMMUNITY): Payer: Medicare HMO

## 2021-01-03 ENCOUNTER — Ambulatory Visit (HOSPITAL_COMMUNITY): Payer: Medicare HMO

## 2021-01-10 ENCOUNTER — Ambulatory Visit (HOSPITAL_COMMUNITY): Payer: Medicare HMO | Admitting: Hematology
